# Patient Record
Sex: Female | Born: 1975 | Hispanic: Yes | Marital: Married | Smoking: Never smoker
Health system: Southern US, Community
[De-identification: ages and names within clinical notes are randomized; demographics above are authoritative.]

## PROBLEM LIST (undated history)

## (undated) ENCOUNTER — Emergency Department (HOSPITAL_COMMUNITY): Payer: BC Managed Care – PPO | Source: Home / Self Care

## (undated) DIAGNOSIS — F32A Depression, unspecified: Secondary | ICD-10-CM

## (undated) DIAGNOSIS — I2510 Atherosclerotic heart disease of native coronary artery without angina pectoris: Secondary | ICD-10-CM

## (undated) DIAGNOSIS — M67439 Ganglion, unspecified wrist: Secondary | ICD-10-CM

## (undated) DIAGNOSIS — Z5189 Encounter for other specified aftercare: Secondary | ICD-10-CM

## (undated) DIAGNOSIS — D649 Anemia, unspecified: Secondary | ICD-10-CM

## (undated) DIAGNOSIS — F419 Anxiety disorder, unspecified: Secondary | ICD-10-CM

## (undated) DIAGNOSIS — G43909 Migraine, unspecified, not intractable, without status migrainosus: Secondary | ICD-10-CM

## (undated) DIAGNOSIS — I10 Essential (primary) hypertension: Secondary | ICD-10-CM

## (undated) DIAGNOSIS — C801 Malignant (primary) neoplasm, unspecified: Secondary | ICD-10-CM

## (undated) DIAGNOSIS — G5601 Carpal tunnel syndrome, right upper limb: Secondary | ICD-10-CM

## (undated) DIAGNOSIS — M7711 Lateral epicondylitis, right elbow: Secondary | ICD-10-CM

## (undated) DIAGNOSIS — M1990 Unspecified osteoarthritis, unspecified site: Secondary | ICD-10-CM

## (undated) DIAGNOSIS — F329 Major depressive disorder, single episode, unspecified: Secondary | ICD-10-CM

## (undated) HISTORY — DX: Anemia, unspecified: D64.9

## (undated) HISTORY — PX: CARDIAC CATHETERIZATION: SHX172

## (undated) HISTORY — DX: Encounter for other specified aftercare: Z51.89

## (undated) HISTORY — DX: Unspecified osteoarthritis, unspecified site: M19.90

## (undated) HISTORY — PX: ABDOMINAL HYSTERECTOMY: SHX81

---

## 2004-02-04 ENCOUNTER — Ambulatory Visit (HOSPITAL_COMMUNITY): Admission: RE | Admit: 2004-02-04 | Discharge: 2004-02-04 | Payer: Self-pay | Admitting: Endocrinology

## 2004-02-11 ENCOUNTER — Encounter (INDEPENDENT_AMBULATORY_CARE_PROVIDER_SITE_OTHER): Payer: Self-pay | Admitting: Specialist

## 2004-02-11 ENCOUNTER — Observation Stay (HOSPITAL_COMMUNITY): Admission: RE | Admit: 2004-02-11 | Discharge: 2004-02-12 | Payer: Self-pay | Admitting: General Surgery

## 2004-02-11 HISTORY — PX: CHOLECYSTECTOMY: SHX55

## 2004-06-24 ENCOUNTER — Other Ambulatory Visit: Admission: RE | Admit: 2004-06-24 | Discharge: 2004-06-24 | Payer: Self-pay | Admitting: Obstetrics and Gynecology

## 2004-06-27 ENCOUNTER — Other Ambulatory Visit: Admission: RE | Admit: 2004-06-27 | Discharge: 2004-06-27 | Payer: Self-pay | Admitting: Obstetrics and Gynecology

## 2004-08-16 ENCOUNTER — Ambulatory Visit: Payer: Self-pay | Admitting: Internal Medicine

## 2004-10-28 ENCOUNTER — Ambulatory Visit (HOSPITAL_COMMUNITY): Admission: RE | Admit: 2004-10-28 | Discharge: 2004-10-28 | Payer: Self-pay | Admitting: Obstetrics and Gynecology

## 2004-11-24 ENCOUNTER — Other Ambulatory Visit: Admission: RE | Admit: 2004-11-24 | Discharge: 2004-11-24 | Payer: Self-pay | Admitting: Obstetrics and Gynecology

## 2004-12-23 ENCOUNTER — Ambulatory Visit (HOSPITAL_COMMUNITY): Admission: RE | Admit: 2004-12-23 | Discharge: 2004-12-23 | Payer: Self-pay | Admitting: Obstetrics and Gynecology

## 2005-02-03 ENCOUNTER — Ambulatory Visit (HOSPITAL_COMMUNITY): Admission: RE | Admit: 2005-02-03 | Discharge: 2005-02-03 | Payer: Self-pay | Admitting: Obstetrics and Gynecology

## 2005-03-10 ENCOUNTER — Inpatient Hospital Stay (HOSPITAL_COMMUNITY): Admission: AD | Admit: 2005-03-10 | Discharge: 2005-03-15 | Payer: Self-pay | Admitting: Obstetrics and Gynecology

## 2005-03-31 ENCOUNTER — Ambulatory Visit: Payer: Self-pay | Admitting: Endocrinology

## 2005-04-14 ENCOUNTER — Other Ambulatory Visit: Admission: RE | Admit: 2005-04-14 | Discharge: 2005-04-14 | Payer: Self-pay | Admitting: Obstetrics and Gynecology

## 2005-04-14 ENCOUNTER — Ambulatory Visit: Payer: Self-pay | Admitting: Endocrinology

## 2005-11-08 ENCOUNTER — Emergency Department (HOSPITAL_COMMUNITY): Admission: EM | Admit: 2005-11-08 | Discharge: 2005-11-08 | Payer: Self-pay | Admitting: Family Medicine

## 2005-11-16 ENCOUNTER — Emergency Department (HOSPITAL_COMMUNITY): Admission: EM | Admit: 2005-11-16 | Discharge: 2005-11-17 | Payer: Self-pay | Admitting: Emergency Medicine

## 2007-11-13 ENCOUNTER — Inpatient Hospital Stay (HOSPITAL_COMMUNITY): Admission: AD | Admit: 2007-11-13 | Discharge: 2007-11-13 | Payer: Self-pay | Admitting: Obstetrics and Gynecology

## 2007-11-18 ENCOUNTER — Inpatient Hospital Stay (HOSPITAL_COMMUNITY): Admission: AD | Admit: 2007-11-18 | Discharge: 2007-11-18 | Payer: Self-pay | Admitting: Obstetrics & Gynecology

## 2007-12-05 DIAGNOSIS — M1990 Unspecified osteoarthritis, unspecified site: Secondary | ICD-10-CM | POA: Insufficient documentation

## 2007-12-05 DIAGNOSIS — I10 Essential (primary) hypertension: Secondary | ICD-10-CM | POA: Insufficient documentation

## 2007-12-05 DIAGNOSIS — E785 Hyperlipidemia, unspecified: Secondary | ICD-10-CM | POA: Insufficient documentation

## 2007-12-05 DIAGNOSIS — I4891 Unspecified atrial fibrillation: Secondary | ICD-10-CM | POA: Insufficient documentation

## 2007-12-05 DIAGNOSIS — I498 Other specified cardiac arrhythmias: Secondary | ICD-10-CM | POA: Insufficient documentation

## 2007-12-11 ENCOUNTER — Emergency Department (HOSPITAL_COMMUNITY): Admission: EM | Admit: 2007-12-11 | Discharge: 2007-12-12 | Payer: Self-pay | Admitting: Emergency Medicine

## 2007-12-14 ENCOUNTER — Inpatient Hospital Stay (HOSPITAL_COMMUNITY): Admission: EM | Admit: 2007-12-14 | Discharge: 2007-12-20 | Payer: Self-pay | Admitting: Emergency Medicine

## 2007-12-19 ENCOUNTER — Other Ambulatory Visit: Payer: Self-pay | Admitting: Obstetrics

## 2007-12-20 ENCOUNTER — Other Ambulatory Visit: Payer: Self-pay | Admitting: Obstetrics

## 2007-12-20 ENCOUNTER — Encounter: Payer: Self-pay | Admitting: Obstetrics

## 2007-12-26 ENCOUNTER — Inpatient Hospital Stay (HOSPITAL_COMMUNITY): Admission: AD | Admit: 2007-12-26 | Discharge: 2007-12-27 | Payer: Self-pay | Admitting: Obstetrics and Gynecology

## 2008-03-05 ENCOUNTER — Inpatient Hospital Stay (HOSPITAL_COMMUNITY): Admission: AD | Admit: 2008-03-05 | Discharge: 2008-03-05 | Payer: Self-pay | Admitting: Obstetrics and Gynecology

## 2008-03-16 ENCOUNTER — Ambulatory Visit: Payer: Self-pay | Admitting: Pulmonary Disease

## 2008-03-16 ENCOUNTER — Inpatient Hospital Stay (HOSPITAL_COMMUNITY): Admission: AD | Admit: 2008-03-16 | Discharge: 2008-04-15 | Payer: Self-pay | Admitting: Obstetrics and Gynecology

## 2008-03-17 ENCOUNTER — Encounter: Payer: Self-pay | Admitting: Obstetrics and Gynecology

## 2008-03-24 ENCOUNTER — Encounter: Payer: Self-pay | Admitting: Obstetrics and Gynecology

## 2008-04-01 ENCOUNTER — Encounter: Payer: Self-pay | Admitting: Obstetrics and Gynecology

## 2008-04-04 ENCOUNTER — Encounter (INDEPENDENT_AMBULATORY_CARE_PROVIDER_SITE_OTHER): Payer: Self-pay | Admitting: Obstetrics and Gynecology

## 2008-04-05 HISTORY — PX: TUBAL LIGATION: SHX77

## 2008-04-09 ENCOUNTER — Encounter (INDEPENDENT_AMBULATORY_CARE_PROVIDER_SITE_OTHER): Payer: Self-pay | Admitting: Obstetrics and Gynecology

## 2008-09-18 ENCOUNTER — Emergency Department (HOSPITAL_COMMUNITY): Admission: EM | Admit: 2008-09-18 | Discharge: 2008-09-19 | Payer: Self-pay | Admitting: Emergency Medicine

## 2008-09-25 ENCOUNTER — Emergency Department (HOSPITAL_COMMUNITY): Admission: EM | Admit: 2008-09-25 | Discharge: 2008-09-26 | Payer: Self-pay | Admitting: Emergency Medicine

## 2009-08-22 ENCOUNTER — Emergency Department (HOSPITAL_COMMUNITY): Admission: EM | Admit: 2009-08-22 | Discharge: 2009-08-22 | Payer: Self-pay | Admitting: Emergency Medicine

## 2010-04-09 ENCOUNTER — Emergency Department (HOSPITAL_COMMUNITY): Admission: EM | Admit: 2010-04-09 | Discharge: 2010-04-09 | Payer: Self-pay | Admitting: Emergency Medicine

## 2010-08-14 ENCOUNTER — Encounter: Payer: Self-pay | Admitting: Endocrinology

## 2010-09-23 ENCOUNTER — Ambulatory Visit (INDEPENDENT_AMBULATORY_CARE_PROVIDER_SITE_OTHER): Payer: No Typology Code available for payment source

## 2010-09-23 ENCOUNTER — Inpatient Hospital Stay (INDEPENDENT_AMBULATORY_CARE_PROVIDER_SITE_OTHER)
Admission: RE | Admit: 2010-09-23 | Discharge: 2010-09-23 | Disposition: A | Discharge status: Home / Self Care | Payer: No Typology Code available for payment source | Source: Ambulatory Visit | Attending: Emergency Medicine | Admitting: Emergency Medicine

## 2010-09-23 DIAGNOSIS — S335XXA Sprain of ligaments of lumbar spine, initial encounter: Secondary | ICD-10-CM

## 2010-09-23 DIAGNOSIS — S139XXA Sprain of joints and ligaments of unspecified parts of neck, initial encounter: Secondary | ICD-10-CM

## 2010-11-08 LAB — BASIC METABOLIC PANEL
BUN: 13 mg/dL (ref 6–23)
CO2: 25 mEq/L (ref 19–32)
Calcium: 9.3 mg/dL (ref 8.4–10.5)
Creatinine, Ser: 0.59 mg/dL (ref 0.4–1.2)
GFR calc Af Amer: >60 mL/min (ref >60)
Glucose, Bld: 100 mg/dL — ABNORMAL HIGH (ref 70–99)

## 2010-11-08 LAB — DIFFERENTIAL
Basophils Absolute: 0.1 10*3/uL (ref 0.0–0.1)
Basophils Relative: 1 % (ref 0–1)
Neutro Abs: 6.8 10*3/uL (ref 1.7–7.7)
Neutrophils Relative %: 57 % (ref 43–77)

## 2010-11-08 LAB — CBC
MCHC: 33 g/dL (ref 30.0–36.0)
Platelets: 266 10*3/uL (ref 150–400)
RDW: 13.3 % (ref 11.5–15.5)

## 2010-11-08 LAB — URINALYSIS, ROUTINE W REFLEX MICROSCOPIC
Bilirubin Urine: NEGATIVE
Ketones, ur: NEGATIVE mg/dL
Nitrite: NEGATIVE
Specific Gravity, Urine: 1.021 (ref 1.005–1.030)
Urobilinogen, UA: 0.2 mg/dL (ref 0.0–1.0)

## 2010-11-14 ENCOUNTER — Emergency Department (HOSPITAL_COMMUNITY)
Admission: EM | Admit: 2010-11-14 | Discharge: 2010-11-14 | Disposition: A | Discharge status: Home / Self Care | Payer: Self-pay | Attending: Emergency Medicine | Admitting: Emergency Medicine

## 2010-11-14 DIAGNOSIS — R0789 Other chest pain: Secondary | ICD-10-CM | POA: Insufficient documentation

## 2010-12-06 NOTE — Consult Note (Signed)
[name]  NIQUITA, [name] NO.:  [id number]   MEDICAL RECORD NO.:  [id number]          PATIENT TYPE:  EMS   LOCATION:  ED                           FACILITY:  [facility]   PHYSICIAN:  Deanna Artis. Hickling, M.D.DATE OF BIRTH:  [birth year]   DATE OF CONSULTATION:  12/12/2007  DATE OF DISCHARGE:                                 CONSULTATION   CHIEF COMPLAINT:  Headaches   HISTORY OF PRESENT CONDITION:  [name] is a 35 year old (today),  right-handed Hispanic woman, gravida 3, para 0-2-0-2 woman who is now [redacted]  weeks gestation.  The patient has had headache of two weeks' duration  that was relieved with Tylenol but has been persistent.  She has had a  continuous headache for two days and presented to the [facility]  Emergency Room after she awakened from a nap at 5:00 p.m. Dec 11, 2007.  The left eye was swollen, tender and had blurred vision.  She was sent  by Dr. Tamela Oddi to the [facility] Emergency Department for  evaluation.  She arrived at 10:55 p.m. and was observed all night long.  She was given a variety of medications including acetaminophen, Zofran,  promethazine, morphine, metoclopramide and Dilaudid.  The headache  finally dropped from 8 or 9 on a scale of 10 to 3.  I was asked to see  her to determine the etiology of her dysfunction and make  recommendations for further workup and treatment.   The patient has hypertension that predated this pregnancy and has been  under the care of Dr. Renaye Rakers.   CURRENT MEDICATIONS:  1. Prenatal vitamins.  2. Reglan 10 mg three times a day.  3. Aldomet 200 mg three times a day.   DRUG ALLERGIES:  None known.   PAST MEDICAL HISTORY:  Remarkable for  1. Hypertension.  2. She has had previous migraine headaches, although none as frequent      or severe.  They began at age 86.   PAST SURGICAL HISTORY:  1. Cesarean section.  2. Laparoscopic cholecystectomy.   REVIEW OF SYSTEMS:  Remarkable for sensory to  light and tenderness in  the left eye which is also somewhat itchy.  She complains of a headache  that is throbbing and dull that involves the left occipital region  extending around to the temporoparietal region.  She has not had fever,  rhinorrhea.  She has had nausea without vomiting.  She has not had  evidence of rash, swelling of her joints, dyspnea, persistent cough,  chest pain, palpitations, no diabetes or thyroid disease.  No  environmental allergies.  No mood or other behavior disorders.  No  diplopia, dysarthria, dysphagia, tinnitus, syncope, vertigo, weakness,  numbness, tingling or loss of bowel or  bladder control.  No seizures.  The 12-system review is otherwise negative.   PAST GYNECOLOGIC HISTORY:  The patient has had preeclampsia of preterm  labor and delivery.   FAMILY HISTORY:  Negative for migraines, seizures, mental retardation,  blindness, deafness, birth defects, Alzheimer's disease, Parkinson's  disease or other neurologic disorders.   SOCIAL HISTORY:  The patient  does not use tobacco, alcohol or drugs.  Her husband is at bedside.  The two children are with grandparents.   PHYSICAL EXAMINATION:  VITAL SIGNS:  On examination today, blood  pressure 128/83 resting pulse 64, respirations 14, temperature is 98.3.  HEENT:  No signs of infection except in the left eye.  I think that she  has conjunctivitis.  Left eye tender to touch but is not showing  increased pressure in the orbit when I push on the eye and compare it  with the right.  Hence, I do not think this represents glaucoma.  The  conjunctivae as well as the sclerae are inflamed.  There is no  significant discharge.  She has no other sign of infection in the head  and neck.  NECK:  Supple neck, full range of motion.  No cranial or cervical  bruits.  LUNGS:  Clear to auscultation.  HEART:  No murmurs.  Pulses normal.  ABDOMEN:  Soft, nontender.  Bowel sounds normal.  EXTREMITIES:  Well-formed without  edema, cyanosis, alterations in tone  or tight heel cords.  SKIN:  No lesions.  VASCULAR:  Tone was normal.  NEUROLOGIC:  Mental status:  The patient is awake, alert, attentive,  appropriate, no dysphasia, dyspraxia, names objects, follows commands.  Conveys thoughts and feelings.  Concentration and fund of knowledge  appear normal.  Cranial nerves:  Round reactive pupils.  Visual fields  full.  Extraocular movements full and conjugate.  She has a little pain  in her left eye moving it side-to-side.  Visual acuity 20/25  bilaterally.  Symmetric facial strength.  Midline tongue and uvula.  Air  conduction greater than bone conduction bilaterally.   Motor examination:  Normal strength, tone and mass.  Good fine motor  movements.  No pronator drift.  Sensation intact to cold, vibration and  stereognosis.  Cerebellar examination:  Good finger-to-nose, rapid  repetitive movements.  Gait was not tested.  Deep tendon reflexes were  normal to brisk at least and lower extremities, normal in the upper  extremities.  She had bilateral flexor plantar responses.  No primitive  reflexes were seen.   IMPRESSION:  1. Migraine without aura, 346.10.  2. Hypertension, 404.10.  3. Conjunctivitis left eye.   Differential diagnosis of the red eye would be glaucoma, optic neuritis  and conjunctivitis.  The patient does not have changes in visual acuity  or visual field.  Fundi is normal.  I see no abnormalities in the lens  or iris to suggest iritis.   RECOMMENDATIONS:  The patient should go home.  She will need ongoing  pain medicine in all likelihood.  This will need to be prescribed by Dr.  Tamela Oddi.  Propranolol is a possibility for the patient.  I would  recommend a dose of 10 mg twice daily, slowly increasing to see if we  can bring headaches under control.  There is no class B preventative  medication.  She is already on Reglan as recommended by Dr. Christell Constant.  She  may need further control of  her blood pressure which remains elevated at  this time.  Propranolol in concert with Aldomet would be very helpful.  Again, this will need to be decided by Dr. Christell Constant.  We have ruled out  serious underlying issues such as subarachnoid bleed and meningitis  because she has no stiff neck and no change in sensorium and the  headache also has eased off considerably.  The quality the headache is  migrainous in nature.  The eye, though a confounding condition, I think  is infectious, not inflammatory or glaucoma.   I discussed the case with Dr. Tamela Oddi and the patient will be  discharged home after a sedimentation rate is obtained.  I have asked  the emergency room to contact me with the result.  She will follow-up  with Dr. Tamela Oddi in one week.   I have had an opportunity to review her MRI scan and MRA personally and  reported the results.  Urinalysis shows specific gravity 1.015, pH 7,  ketones 15 mg/dL, urine nitrate and leukocyte esterase were negative.   Sodium 136, potassium 3.4, chloride 103, BUN 8, creatinine 0.7, glucose  84, ionized calcium 1.19.  Hemoglobin 13.3, hematocrit 39.  Bicarbonate  was 22.   White blood cell count 10,500, hemoglobin 12.21, hematocrit 36.2, MCV  83.6, platelet count 231,0000, 57 polys, absolute granulocyte count  6000, 31 lymphs, 9 monos, 2 eosinophils.   I will be happy to see the patient in follow-up at the request of Dr.  Tamela Oddi.  If you have questions or I can be of assistance, do not  hesitate to contact me.      Deanna Artis. Sharene Skeans, M.D.  Electronically Signed     [facility]/MEDQ  D:  12/12/2007  T:  12/12/2007  Job:  [id number]   cc:   Roseanna Rainbow, M.D.  Fax: [phone]   Renaye Rakers, M.D.  Fax: [phone]

## 2010-12-06 NOTE — Consult Note (Signed)
NAME:  [name], CHRISTO NO.:  [id number]   MEDICAL RECORD NO.:  [id number]          PATIENT TYPE:  OUT   LOCATION:  MFM                           [facility]ACILITY:  [facility]   PHYSICIAN:  Felipa Evener, MD  DATE OF BIRTH:  [birth year]   DATE OF CONSULTATION:  04/08/2008  DATE OF DISCHARGE:                                 CONSULTATION   IDENTIFICATION:  The patient is a 35 year old Hispanic female with a  past medical history significant for systemic hypertension for which she  has been treated as an outpatient by a hypertensive specialist as per  patient who came to the hospital for pregnancy-induced hypertension as  well as a question of preeclampsia.  The patient has never had any  history of seizures.  While the patient was in the hospital, she was  continued on her labetalol, however, that was stopped after her delivery  on April 04, 2008 and the patient remains persistently hypertensive.  She was given labetalol while the patient was on the floor, but little  effect on the blood pressure.  The patient was transferred to the  intensive care unit for further management of hypertension.  She also  complains of headache which would be persistent with hypertensive  urgency.   PAST MEDICAL HISTORY:  1. Significant for systemic hypertension.  2. Preeclampsia.  3. Depression.  4. Migraines.  5. Obesity.   PAST SURGICAL HISTORY:  1. Cholecystectomy.  2. C. section x2 for 2 premature children.   HOME MEDICATIONS:  1. Labetalol 400 mg t.i.d.  2. Tylenol.  3. Stadol.   ALLERGIES:  NO KNOWN DRUG ALLERGIES.   FAMILY HISTORY:  Noncontributory.   SOCIAL HISTORY:  She is a nonsmoker, nondrinker, no history of drug use  per patient.   REVIEW OF SYSTEMS:  A 12-point review of systems was performed and  negative other than as mentioned in the HPI.   PHYSICAL EXAMINATION:  GENERAL:  This is a well-appearing female sitting  comfortably in the exam bed in no acute  distress.  VITAL SIGNS:  She is afebrile with a heart rate of 96, blood pressure  143/85, saturation 99% on room air, respiratory rate 16.  HEENT:  Normocephalic, atraumatic.  Pupils are equal, round, and  reactive to light.  Extraocular movements are intact.  Oral and nasal  mucosa within normal limits.  NECK:  There is no thyromegaly or lymphadenopathy.  No hepatojugular  reflux.  HEART:  Regular rate and rhythm, normal S2 with a loud S1 appreciated at  the left sternal border.  LUNGS:  Bibasilar crackles, resolved with deep cough.  ABDOMEN:  Obese, soft, nontender and nondistended.  Positive bowel  sounds.  EXTREMITIES:  No edema.  No rash appreciated.  NEUROLOGIC:  Grossly intact except for a headache.   LABORATORY DATA:  Laboratory studies were all reviewed and significant  for sodium of 133, potassium 3.1, chloride 101, bicarbonate 27, glucose  107, BUN 7, creatinine 0.44, calcium 7.1, magnesium 5.0, protein 5.4,  albumin 2.2, AST 29, ALT 32, LDH 146, white blood cell count 10.9,  hemoglobin 10.2, hematocrit  30.4, platelet 137.  There is a significant  amount of protein in her urine and is being managed by the primary  service.   ASSESSMENT/PLAN:  The patient is a 35 year old female with a past  medical history significant for systemic hypertension requiring multiple  medications at home, being evaluated by a hypertensive specialist as an  outpatient basis.  The patient takes high-dosed labetalol on a regular  basis.  However, was kept off her medications post-partum.  Pulmonary  critical care is called for systemic hypertension evaluation and  management.  At this point with the blood pressure in the 170s prior to  initiation of Cardene, the patient's hypertension is consistent with  hypertensive urgency.  The patient is started on a Cardene drip and we  will continue that.  We will start labetalol p.o. at home doses.  We  will start it at 300 mg p.o. t.i.d. given the fact  that the patient has  been delivered withholding parameters initiated.  We will order a 2-D  echo and renal ultrasound with Doppler for renal artery stenosis.  We  will check urine for metanephrines for evidence of pheochromocytoma.  Would continue the Cardene at this point.  We will follow up with the  patient on Friday for results of the labs.  If the patient remains  hypertensive with the labetalol dosing, would recommend Norvasc 10 mg  daily and would strongly urge her for consultation by a renal physician  for further evaluation of systemic hypertension as at the patient's age,  really there should be little-no reasons other than reversible disease  for her systemic hypertension.      Felipa Evener, MD  Electronically Signed     [facility]/MEDQ  D:  04/08/2008  T:  04/09/2008  Job:  [id number]

## 2010-12-06 NOTE — H&P (Signed)
NAME:  [name], [name]              ACCOUNT NO.:  [id number]   MEDICAL RECORD NO.:  [id number]          PATIENT TYPE:  OBV   LOCATION:  9303                          [facility]ACILITY:  [facility]   PHYSICIAN:  Hal Morales, M.D.DATE OF BIRTH:  04/28/1976   DATE OF ADMISSION:  12/25/2007  DATE OF DISCHARGE:                              HISTORY & PHYSICAL   Patient is a 35 year old, married, Hispanic female, gravida 3, para 0-2-  0-2, at 50 and 5/7 weeks per an [facility] of June 19, 2008, who presents  today at [facility] office for a new OB interview and transfer of  care, with subsequent elevated blood pressures and unrelenting headache.  At office, blood pressure in her left arm was 152/110 and right arm was  148/104.  Patient was sent to [facility]U for serial blood pressures, CBC with  differential, and CMET, and further evaluation for need of blood  pressure management.  She complains of a headache, which she rates 9/10,  points to frontal and parietal areas of her head.  She reports it is  constant with associated pain that radiates down her left arm.  She has  had some relief with prescribed pain medications, but reports that the  headache has not completely resolved in several weeks.  Complains of a  pinching pain  in her left upper chest, fairly frequent nausea.  Last  emesis she recalls on Monday, today being Wednesday.  She does avoid  eating to prevent vomiting.  She complains of insomnia secondary to her  pain.  Benadryl has been tried in the past, which she states did not  help.  Complains of an unresolved cough and some wheezing with onset at  the end of April.  She was seen April 22, diagnosed with bronchitis with  a negative chest x-ray.  She returned April 27 with worsening signs and  symptoms.  Again, had a negative chest x-ray and was diagnosed with  asthma , treated with nebulizer.  She was also discharged with a metered  dose inhaler, but states, it does not help.  She denies  UTI signs or  symptoms, abdominal pain or cramping, visual disturbances, epigastric or  right upper quadrant pain, constipation or diarrhea, abnormal discharge  or vaginal bleeding, fever.  Patient began her prenatal care with  [facility].  She subsequently transferred to _[facility] secondary to  insurance change, and now is transferring to [place] as of  today, June 3.   She presented with complaint of a headache on Dec 11, 2007 to Maternity  Admissions Unit, and was subsequently transferred by Dr. Clearance Coots to  [facility] for further evaluation of headache.  She was discharged on  May 21.  She then represented and was readmitted on Dec 13, 2007, which  she stayed at [facility] through May 25.  She had extensive workup with  neuro consult, MRI, MRA.  She had negative Lyme disease and Epstein-Barr  virus titers.  No significant findings were found.  She was then  transferred back to [facility] on the 25th, where she  stayed through  May 29.  She was diagnosed during that timeframe with  left eye conjunctivitis.  She reports having several ultrasounds during  her pregnancy thus far.  She did have an ultrasound at the [facility] at [facility] last Friday, May 29.  Report  notes SIUP, cephalic presentation,anterior placenta above cervical os.  Fetal heart rate 145.  AFI was subjectively within normal limits.  Estimated fetal weight was 3 ounces, which was 97 grams.  Size was  consistent with dates.  Anatomy that was visualized appeared normal.  Cervix appeared closed without funneling, and was recommended to have  followup ultrasound three to four weeks for a full anatomy evaluation.   ALLERGIES:  No known drug allergies.   MEDICATIONS:  She is on:  1. Aldomet 500 mg p.o. b.i.d.  2. Reglan 10 mg p.o. t.i.d.  3. Fioricet p.r.n.   PAST MEDICAL HISTORY:  1. Chronic hypertension, which was diagnosed after her first delivery      after  it was unresolved.  2. Migraines diagnosed at age 13 without aura.  3. Depression.  Unsure when diagnosed, but she has not been on any      medication since December of 2008.   SURGICAL HISTORY:  She has had C-section x2 and a laparoscopic  cholecystectomy.   OBSTETRICAL HISTORY:  1. Gravida 1.  She was induced at 35 weeks secondary to preeclampsia,      and had subsequent failed induction and failure to progress leading      to a primary low transverse Cesarean section.  That was in      [place].  2. Gravida 2 was a repeat C-section at approximately 29 weeks for      preeclampsia also.  She reports the child has asthma, but is      otherwise healthy.  3. Gravida 3 is current pregnancy.   SOCIAL HISTORY:  She is married, two children.  Denies ETOH, tobacco or  illicit drug use.   FAMILY HISTORY:  Noncontributory.   OBJECTIVE:  Vital signs are within normal limits and she is afebrile  with exception of elevated blood pressures.  Range is 139 to 171  systolic, 78 to 108 diastolic.  On three different attempts, unable to  obtain fetal heart tones.  CBC was within normal limits.  Her white  count was 9, hemoglobin 11.6, hematocrit 33.8, platelets 240.  Her CMET  was within normal limits with exception of potassium equal to 3.  AST  was elevated equal to 39.  ALT was 50.  UA was within normal limits with  exception of cloudy appearance and specific gravity was 1.020.   PHYSICAL EXAMINATION:  GENERAL:  No acute distress.  Alert and oriented  x3.  Affect is slightly flat, but does smile at times.  She is soft-  spoken, well dressed, well nourished.  HEENT:  Grossly intact.  Normocephalic and atraumatic.  She does have  about a 3 cm area of redness, which is inferior to her left lower eyelid  and along her cheek bone.  She has no lymphadenopathy.  CARDIOVASCULAR:  Regular rate and rhythm.  No murmur.  LUNGS:  She has inspiratory wheezes and some crackles in her upper  airways and  lower bases.  ABDOMEN:  Soft.  No rebound or guarding, but tender in her right lower  quadrant when nurses attempted to obtain fetal heart tones.  She has  notable pannus and previous scar from two other C-sections.  PELVIC:  Deferred.  EXTREMITIES:  No edema.  DTRs 2+.  No clonus.  She had 5/5 grasp in  bilateral upper extremities.   ASSESSMENT:  1. Intrauterine pregnancy at 14-5/7 weeks.  2. Uncontrolled hypertension, which is chronic.  3. Migraines.  4. History of depression.  5. Hypokalemia.  6. Elevated liver function tests.  7. Asthma versus unresolved bronchitis.   PLAN:  1. Per Dr. Pennie Rushing, ad mitt for 23 hour observation for pain      management as well as blood pressure observation and medication      change.  2. Patient to receive and p.o. potassium replacement.  3. We will have maternal fetal medicine and psychiatry consults.  4. Albuterol inhaler q.i.d. for cough and wheezes.  5. We will obtain a 24 hour urine for a baseline kidney function.  6. Medical doctors to follow.      Candice Navarre, [place]      Hal Morales, M.D.  Electronically Signed    [facility]/MEDQ  D:  12/25/2007  T:  12/25/2007  Job:  [id number]

## 2010-12-06 NOTE — Op Note (Signed)
[name]  [name], [name] NO.:  [id number]   MEDICAL RECORD NO.:  [id number]         PATIENT TYPE:  WINP   LOCATION:                                [facility]  [facility]   PHYSICIAN:  Crist Fat. Rivard, M.D. DATE OF BIRTH:  1976-01-12   DATE OF PROCEDURE:  04/05/2008  DATE OF DISCHARGE:                               OPERATIVE REPORT   PREOPERATIVE DIAGNOSES:  Intrauterine pregnancy at 29 weeks and 1 day  with pregnancy-induced hypertension and superimposed severe  preeclampsia, two previous cesarean sections, and desire for  sterilization.   POSTOPERATIVE DIAGNOSES:  Intrauterine pregnancy at 29 weeks and 1 day  with pregnancy-induced hypertension and superimposed severe  preeclampsia, two previous cesarean sections, and desire for  sterilization.   ANESTHESIA:  Spinal, Dr. Pamalee Leyden.   PROCEDURE:  Repeat low-transverse cesarean section with bilateral tubal  ligation.   SURGEON:  Crist Fat. Rivard, MD   ASSISTANT:  Rhona Leavens, CNM.   ESTIMATED BLOOD LOSS:  500 mL.   PROCEDURE IN DETAIL:  After being informed of the planned procedure with  possible complications including bleeding; infection; injury to bowel,  bladder, or ureters; irreversibility of tubal ligation; as well as  failure rate of 1 in 500; informed consent was obtained.  The patient  was taken to OR #1, given spinal anesthesia without any complication.  She was placed in the dorsal decubitus position, pelvis tilted to the  left, prepped and draped in a sterile fashion, and a Foley catheter was  inserted in her bladder.  After assessing adequate level of anesthesia,  we infiltrated the previous scar with 20 mL of Marcaine 0.25 and  performed a Pfannenstiel incision overlooking the scar, which was  brought down sharply to the fascia.  The fascia was incised in a low-  transverse fashion and linea alba was dissected.  Peritoneum was entered  bluntly and at this time, we encountered dense adhesions with  omentum.  Those are sharply lysed and cauterized until we can push them up in the  upper abdomen to free the access to the lower pelvis.  We then also  noted a significant adhesions between the fascia and the lower half of  the uterus and those were sharply lysed until we can have access to the  pelvis.  A Alexis retractor was inserted.  The visceral peritoneum was  identified and opened in a low-transverse fashion to allow [place] to develop  a bladder flap and retract the bladder.  The lower uterine segment is  somewhat developed and we were able to perform a low-transverse cesarean  section with knife entry which was extended with bandage scissors.  Amniotic fluid was clear.  We assist the birth of a female infant at 49,  16, three nuchal cords were reduced.  Mouth and nose were suctioned.  Baby was delivered.  Cord was clamped with 2 Kelly clamps and baby was  given to Dr. Joana Reamer, neonatologist present in the room.  To be noted,  the infant was very active with good muscle tone and spontaneous  breathing and crying.  A 2 mL  of blood was drawn from the umbilical  artery and 10 mL was drawn from the umbilical vein and specimen was sent  for cord pH rapidly.  The placenta was allowed to deliver spontaneously.  It was complete cord, has 3 vessels and the uterine revision was  negative.  We proceed with closure of the myometrium in two layers,  first with a running-lock suture of 0 Vicryl then with a Lembert suture  of 0 Vicryl imbricating the first one.  We complete hemostasis on the  peritoneal edges using cauterization.  Both paracolic gutters were  cleaned.  Both tubes and ovaries assessed and normal, and the pelvis was  irrigated profusely with warm saline and we completed hemostasis with  cauterization.  We then proceed with tubal ligation, first with the left  tube and we proceeded in the exact same fashion on the right tube by  identifying the tube, grasping it with 2 Babcock forceps and  entering  the mesosalpinx with cautery.  We doubly ligated the proximal end and  the distal end of our chosen section, removed 1.5 cm of the isthmal-  ampullary tube and cauterized both stumps.  Hemostasis was adequate.  We  again irrigated profusely the pelvis to note a satisfactory hemostasis.  The muscles were reapproximated with simple sutures of 0 Vicryl and  under fascia hemostasis was completed with cautery.  The fascia was  closed with 2 running sutures of 1 Vicryl meeting midline.  The wound  was irrigated with warm saline.  Hemostasis is adequate, is completed  with cautery, and a #10 JP drain was left in the incision with a left  counter incision and held in place with 0 silk.  The skin was closed  with subcuticular suture of 3-0 Monocryl and Steri-Strips.   Instrument and sponge count is complete x2.  Estimated blood loss is 500  mL.  The procedure was very well tolerated by the patient who was taken  to recovery room in a well and stable condition.   SPECIMEN:  Placenta and 2 segments of tubes sent to Pathology.   Little boy born at 11:22 p.m., received an Apgar of 9 at 1 minute and 9  at 5 minutes, weight 2 pounds 6 ounces, and cord pH was 7.26.  Baby is  in NICU.      Crist Fat Rivard, M.D.  Electronically Signed     SAR/MEDQ  D:  04/05/2008  T:  04/05/2008  Job:  [id number]

## 2010-12-06 NOTE — H&P (Signed)
NAMEWALTERINE, [name]              ACCOUNT NO.:  [id number]   MEDICAL RECORD NO.:  [id number]          PATIENT TYPE:  INP   LOCATION:  9158                          [facility]ACILITY:  [facility]   PHYSICIAN:  Hal Morales, M.D.DATE OF BIRTH:  1976/02/04   DATE OF ADMISSION:  03/16/2008  DATE OF DISCHARGE:                              HISTORY & PHYSICAL   HISTORY OF PRESENT ILLNESS:  This is a 35 year old gravida 3, para 0-2-0-  2 at 26-3/7 weeks who presents with elevation of blood pressures despite  medication.  She has been on labetalol 400 mg 3 times a day.  She states  she has missed no doses except for this morning dose.  She did report  vomiting after being a given dose from the hospital a week ago, but has  taken all of her doses since then.  She does report a headache for  several days and upper abdominal pain.  Pregnancy has been followed  since 14 weeks' by the MD Service and they remarkable for:  1. Transfer of care 14 weeks.  2. C-section x2 secondary to preeclampsia.  3. History of preeclampsia and chronic hypertension.  4. Desires BTL.  5. Depression.  6. Migraines.  7. Obesity.  8. Status post cholecystectomy.  9. Abnormal beta-glycoproteins.   ALLERGIES:  None.   OB HISTORY:  Remarkable for C-section delivery in 2002, female infant at  [redacted] weeks gestation, weighing 5 pounds remarkable for preeclampsia and  failure to progress.  She had a C-section delivery in 2006 of a female  infant at [redacted] weeks gestation, weighing 3 pounds 4 ounces remarkable for  preeclampsia.   PAST MEDICAL HISTORY:  Remarkable for postpartum depression,  preeclampsia, and preterm delivery.  She has a history of childhood  varicella.  She has a history of chronic hypertension since 2002.  She  has a history of anemia and migraines.  She has a history of depression,  but has stopped taking her medications.   PAST SURGICAL HISTORY:  Remarkable for C-section in 2002 and 2006, and  cholecystectomy in  2004.   FAMILY HISTORY:  Remarkable for an uncle with heart disease.  Mother and  brother with hypertension.  Sister and brother with anemia.  Father and  son with asthma.  Mother with breast cancer.   GENETIC HISTORY:  Remarkable for brother and cousin with consanguinity.  Cousin with mental retardation and a cousin with twins.   SOCIAL HISTORY:  The patient is married to Lajean Silvius who is involved  and supportive.  She is of the [employer].  She denies any alcohol,  tobacco, or drug use.   PRENATAL LABS:  Hemoglobin 12.2, platelets 274.  Blood type O+.  Antibody screen negative.  Sickle cell negative.  RPR nonreactive.  Rubella immune.  Hepatitis negative.  Pap test normal.  Gonorrhea  negative.  Chlamydia negative.   HISTORY OF CURRENT PREGNANCY:  The patient was previously seen at  [facility] and [place], and transferred to [facility] at 14 weeks.  She was hospitalized at that time for hypertension and placed initially  on Aldomet 500  mg twice a day and was also placed on Celexa and baby  aspirin for depression and abnormal glycoproteins, but has since stopped  taking those.  She was seen by Neurology for chronic headaches and  migraines.  She was increased to Aldomet 500 mg 3 times a day.  Her  baseline 24-hour urine protein was 80 mg.  She was given Tylenol #3 by  the neurologist for her headaches.  Ultrasound at 18 weeks was normal.  She had an ultrasound at 24 weeks showing a weight of 1 pound 9 ounces,  normal fluid, and vertex presentation with an anterior placenta.  Blood  pressure was elevated.  At that time, she was sent to the hospital for  [facility] workup.  Labs at that time were normal and urine was negative for  protein.  She was changed to labetalol initially 300 mg 3 times a day  and then later increased to 400 mg 3 times a day and she presented to  the office today with headaches and abdominal pain and was found to have  elevations in her blood pressure.    OBJECTIVE DATA:  VITAL SIGNS:  Stable, afebrile.  Blood pressures  initially 179-196/103-111.  Heart rate is in the 70s.  HEENT:  Within normal limits.  Thyroid normal and not enlarged.  CHEST:  Clear to auscultation.  HEART:  Regular rate and rhythm.  ABDOMEN:  Gravid.  EFM shows fetal heart rate in 150s with no  contractions.  NEURO:  Grossly intact with DTRs 2+, 3+ with no clonus.  EXTREMITIES:  Trace edema in the lower extremities with puffy hands.  PELVIC:  Deferred   ASSESSMENT:  1. Intrauterine pregnancy at 26-3/7 weeks.  2. Chronic hypertension.  3. Abnormal beta-glycoproteins.  4. History of depression.  5. History of preeclampsia x2 with cesarean section x2.   PLAN:  1. Admit to antenatal per Dr. Pennie Rushing.  2. IV labetalol for immediate blood pressure control and then we will      begin Aldomet 500 mg 3 times a day.  3. PIH labs and 24-hour urine.  4. Maternal fetal medicine consult Dr. Katrinka Blazing is presently here and      making recommendations.      [name], C.N.M.      ______________________________  Hal Morales, M.D.    MLW/MEDQ  D:  03/16/2008  T:  03/17/2008  Job:  [id number]

## 2010-12-06 NOTE — Consult Note (Signed)
[name]  Emily, [name]              ACCOUNT NO.:  [id number]   MEDICAL RECORD NO.:  [id number]          PATIENT TYPE:  OBV   LOCATION:  9303                          [facility]ACILITY:  [facility]   PHYSICIAN:  Emily Copier, MD           DATE OF BIRTH:  1975-08-06   DATE OF CONSULTATION:  12/26/2007  DATE OF DISCHARGE:                                 CONSULTATION   REASON FOR CONSULTATION:  The patient is 14 plus weeks pregnant with  chronic hypertension and persistent migraine headache.  The consult is  requested to address the following issues.  1. Blood pressure management.  2. Chronic headache.  3. History of preterm birth.  4. Abnormal beta-2-glycoprotein findings.   HISTORY OF PRESENT ILLNESS:  The patient is a 35 year old G3, P2,  currently at 31 plus weeks gestation previously seen by me on Dec 17, 2007, at the request of Dr. Coral [name], secondary to similar  complaints.  She complaints of headache that were present now for over 3  weeks and is status post consultations from neurology as well as  management at [facility].  This includes consultations with  neurology, internal medicine and hypertension medicine since Dec 12, 2007.  She is also undergone MRA and MRI in her previous admission.  She  has a history of migraine headache and hypertension, however, this  chronic pain is new, especially the duration.   PAST MEDICAL HISTORY:  As noted above.   MEDICATIONS:  1. Prenatal vitamins.  2. Reglan.  3. Aldomet.  4. Previously Compazine.   PAST SURGICAL HISTORY:  Notable for cesarean delivery x2.   PAST OB HISTORY:  Notable for her first pregnancy completing at 35 weeks  secondary to preeclampsia and her second pregnancy also being  complicated with preeclampsia at 29 weeks delivery in August 2006.  Both  of these pregnancies were delivered via cesarean.   PAST GYN HISTORY:  Negative for any abnormal Pap smears.   SOCIAL HISTORY:  Negative for alcohol, tobacco, and  drugs abuse.   FAMILY HISTORY:  Noncontributory.   ALLERGIES:  No known drug allergies.   PHYSICAL EXAMINATION:  VITAL SIGNS:  She was noted to have a blood  pressure in the office 169/110 in her office visit yesterday, but during  her inpatients stay her blood pressure is range from 132 to 150 over 86  to 96.  Her pulse has been 60 to 72, and her respiratory rate of 18 to  20.  GENERAL:  The patient is alert and oriented in no acute distress.  RESPIRATORY:  Clear to auscultation.  CARDIAC:  Regular rate and rhythm.  ABDOMEN:  An over weight patient with no palpable abdominal masses and  notable gravid uterus.  EXTREMITIES:  No evidence of cyanosis or edema.   ASSESSMENT AND PLAN:  A 35 year old G3, P 0-2-0-2 at 14 plus weeks  admitted for hypertension and persistent headache.  1. Hypertension.  Currently, she is on Aldomet 500 mg b.i.d., which      has been increased in the hospital to t.i.d.  This can be increased      up to q.i.d. and then my recommendations will be to add labetalol      as needed to achieve the goal of 130 to 140s over 70 to 80s.   1. Persistent intractable headache.  This is a concern given the      duration.  As mentioned, she has undergone consultations with      neurology as well as radiologic imaging with MRI/MRA.  Given the      findings of elevated beta-2-glycoprotein, IgG antibody identified      during her hypercoagulable evaluation, secondary to her history of      two preeclampsia diagnoses,  my concern is that there may be a      component of antiphospholipid syndrome present.  There are some      reports of antiphospholipid syndrome having only CNS manifestations      and although this often includes stroke, this can be only white      matter lesions.  I would request that neurology be informed of this      finding of beta-2-glycoprotein levels, IgG antibodies being      positive.  They can help guide [place] on whether this patient's       presentation warrents further evaluation with follow-up MRI or      reevaluation of the prior MRI looking for small white matter      lesions consistent with the antiphospholipid syndrome.   Given this findings and her pregnancy status and her preeclampsia  history, I recommend starting this patient on low dose aspirin therapy  as an initial approach.  Unless  further information is obtained, we may  or may not consider adding heparin therapy as well.   Additional considerations related to the her headache this pregnancy, is  that many migraines improve after the first trimester and this may  improve spontaneously over course of the coming weeks - given that she  is only [redacted] weeks pregnant at this time.  Again, given the complexity of  these findings and the prior consultation with neurology, I would  recommended that they be reconsulted.  I would be readily available to  address any medications that they would recommend for headache therapy.   1. History of prior preterm birth.  The patient reports prior      deliveries at 35 and 29 weeks, both for preeclampsia.  Given that      these were indicated preterm births, she is not a candidate for      weekly progesterone therapy.  I would follow her closely for      development of preeclampsia and a 24-hour urine collection is in      progress.   1. Elevated liver function studies and beta-2-glycoprotein IgG      antibodies.  As mentioned, the beta-2-glycoprotein findings in      addition to her pregnancy history provide reason for management of      this pregnancy as if she has antiphospholipid syndrome and my      recommendations would be that low dose aspirin therapy be initiated      in the second and third trimesters.  We can reevaluate with regards      to heparin and low-molecular-weight heparin therapy, if baby      aspirin therapy fails.  With regards to the elevated ALT levels = 39 and AST levels = 50, I  would recommend  reevaluating those in  1 week to see if they are rising,  remaining stable or improving.  But at this time, I would not recommend  any other testing or treatment for the elevated LFTs.   Antenatal care plan:  Given my concern for antiphospholipid syndrome in  this patient, she will need serial ultrasound assessments every 3 to 4  weeks throughout her late second and third trimesters to assess for  fetal growth and amniotic fluid volume.  She will need umbilical artery  Doppler assessment as well, if there is any evidence of fetal growth  restriction.  The fetal assessment will also include nonstress testing  beginning twice weekly at [redacted] weeks gestation.  Again, we would gladly  assist with any and all of these management plans as the pregnancy  progresses.   Thank you very much for allowing me to participate in the care of Ms.  Bradly [name].  If you have any additional questions please feel free to  contact me.      Emily Copier, MD  Electronically Signed     SJ/MEDQ  D:  12/26/2007  T:  12/27/2007  Job:  [id number]

## 2010-12-06 NOTE — Discharge Summary (Signed)
[name], [name]              ACCOUNT NO.:  [id number]   MEDICAL RECORD NO.:  [id number]          PATIENT TYPE:  INP   LOCATION:  9303                          [facility]ACILITY:  [facility]   PHYSICIAN:  Hal Morales, M.D.DATE OF BIRTH:  01/18/1976   DATE OF ADMISSION:  12/25/2007  DATE OF DISCHARGE:  12/27/2007                               DISCHARGE SUMMARY   ADMISSION DIAGNOSES:  1. Intrauterine pregnancy at 14 weeks.  2. Chronic hypertension.  3. Migraine headaches.  4. History of depression.  5. Hypokalemia.  6. Elevated liver function tests.  7. Asthma.  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   DISCHARGE DIAGNOSES:  1. Intrauterine pregnancy at 14 weeks.  2. Chronic hypertension.  3. Migraine headaches.  4. History of depression.  5. Hypokalemia.  6. Elevated liver function tests.  7. Asthma.  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  9. Elevated liver function tests, improved.  10.Headache, improved.  11.Abnormal beta II glycoprotein IgG.   HOSPITAL PROCEDURES:  1. IV fluids.  2. Analgesia.  3. A 24-hour urine.  4. Perinatal consult.  5. Psychiatry consult.  6. Neurology consult.   HOSPITAL COURSE:  The patient was admitted with persistent headache and  elevated blood pressures as well as nausea and vomiting.  She had  previously had a thorough workup from a neurological standpoint in May  2009 with a negative workup.  She was started on a 24-hour urine to  evaluate her renal function.  Aldomet was increased to improve her blood  pressure control.  Reglan, Excedrin, and Flexeril were administered for  migraines as well as Motrin being added later.  Maternal fetal medicine  consult was ordered.  On December 26, 2007, she was doing well stating her  headache was some better but she still had one in the frontal part of  her head.  Blood pressures ranged from 119-150 over 72-96.  She had no  edema.  Liver function tests were AST of 31, ALT of 44; originally AST  was 39 and  ALT was 50.  She was seen by Dr. Ander Slade and evaluated for  recommendations; and on December 27, 2007, she was doing better.  Headache  had improved with the addition of ibuprofen, and we increased her  Aldomet from twice a day to 3 times a day.  Liver function tests were  down close to the normal at AST of 29 and ALT of 43.  A 24-hour urine  showed creatinine clearance of 257 and a total protein of 80.  Blood  pressures were improved and ranged from 120-126 over 77-84.  She was  seen by Dr. Jeanie Sewer, and he recommended beginning the patient on  Celexa for her depression.  Nausea and vomiting were also improved, and  she was discharged home on Zofran.  Dr. Pennie Rushing spoke with Dr. Ander Slade about  coordinating neurology followup in the future for her abnormal  glycoproteins that were identified as well as her headache history.  She  was deemed to receive full benefit of her hospital stay and was  discharged home.   DISCHARGE MEDICATIONS:  1. Zofran 4-8 mg every 8 hours as needed with Colace.  2. Celexa 10 mg every day.  3. Reglan 10 mg every 6 hours as needed for headache.  4. Ibuprofen 600 mg every 6 hours as needed for headache.  5. Aldomet 500 mg every 8 hours.  6. Prenatal vitamin 1 p.o. daily.   DISCHARGE LABS:  A 24-hour urine:  Protein 80, total bilirubin 0.2,  alkaline phosphatase of 41, AST 29, ALT 43, and creatinine clearance  257.   DISCHARGE INSTRUCTIONS:  Medication administration and diet as tolerated  for nausea.   DISCHARGE FOLLOWUP:  Followup will occur on January 10, 2008, at [facility] and appointments to be announced by Dr. Ronnell Freshwater office for  Neurology followup.   CONDITION ON DISCHARGE:  Good.      Marie L. Williams, C.N.M.      Hal Morales, M.D.  Electronically Signed    MLW/MEDQ  D:  12/27/2007  T:  12/28/2007  Job:  [id number]

## 2010-12-06 NOTE — H&P (Signed)
[name]  NOELIE, RENFROW              ACCOUNT NO.:  [id number]   MEDICAL RECORD NO.:  [id number]          PATIENT TYPE:  INP   LOCATION:  1521                         FACILITY:  [facility]   PHYSICIAN:  Madaline Savage, MD        DATE OF BIRTH:  09/16/1975   DATE OF ADMISSION:  12/13/2007  DATE OF DISCHARGE:                              HISTORY & PHYSICAL   PRIMARY CARE PHYSICIAN:  Dr. Renaye Rakers.   PRIMARY OBSTETRICIAN:  Dr. Tamela Oddi.   CHIEF COMPLAINT:  Headache.   Ms. Esperanza is a 35 year old G3, P2 who is at [redacted] weeks gestation who  comes in with complaints of headaches for the last 2 weeks.  She does  say she has history of migraine headaches in the past and as per the  patient the headache is mostly located in the back of the head and down  her neck and into her shoulders and arms.  This pain is constant, it is  throbbing in nature and she states it goes up to 9-10 on a scale of 1-  10.  At this time she rates the pain 7/10.  She went to her obstetrician  who sent her to [facility] ED yesterday and in the ED yesterday the ER  doctors consulted neurologist, Dr. Sharene Skeans.  Dr. Sharene Skeans saw the  patient and complete workup was done including an MRI/MRA and ESR and at  that time Dr. Sharene Skeans deemed that this headache is likely a migraine  attack.  He started her on Compazine and was discharged home yesterday.  After going home she said there was no relief of the pain.  The pain was  still constant in nature and she was not able to sleep because the pain,  and so she came back to the ER again.   PAST MEDICAL HISTORY:  1. History of hypertension.  2. History of migraine headaches in the past.   PAST SURGICAL HISTORY:  Cesarean section x2.   ALLERGIES:  No known drug allergies.   CURRENT MEDICATIONS:  She is on  1. Prenatal vitamins.  2. Reglan 5 mg three times daily.  3. Aldomet 250 mg twice daily.  4. Compazine 5 mg twice daily.   SOCIAL HISTORY:  There is no history of  smoking, alcohol, drug abuse.   FAMILY HISTORY:  Negative for migraines, seizures in the family.   REVIEW OF SYSTEMS:  GENERAL:  She denies any recent weight loss or  weight gain.  No fever or chills.  HEENT: She does complain of  headaches, some blurred vision but no double vision.  No runny nose.  No  sore throat.  CARDIOVASCULAR SYSTEM:  Denies chest pain, palpitations.  RESPIRATORY SYSTEM:  No shortness of breath or cough.  GI:  No abdominal  pain, nausea, vomiting, diarrhea, constipation.   PHYSICAL EXAMINATION:  She is alert and oriented x3.  VITAL SIGNS:  Temperature 97.8,  pulse rate of 70, blood pressure  147/94, respiratory rate 19, oxygen saturation 100% on room air.  HEENT:  Head atraumatic, normocephalic.  Pupils bilaterally equal and  react to  light.  Mucous membranes are moist.  NECK:  Supple.  No JVD, no carotid bruit.  CARDIOVASCULAR SYSTEM:  S1 and S2 heard.  Regular rate and rhythm.  CHEST:  Clear to auscultation.  ABDOMEN:  Soft.  Bowel sounds heard.  EXTREMITIES:  No edema, cyanosis or clubbing.   LABORATORIES:  Show a white count 11.9, hemoglobin 14, platelets 217.  Sodium 136, potassium 3.2, chloride 106, bicarb 5, BUN 0.5, glucose of  87.  Urinalysis is negative.  ESR which is done on May 21 was 10.   IMPRESSION:  1. Headache, likely a migraine attack.  2. Thirteen weeks gestation.  3. History of hypertension.  4. Hypokalemia.   PLAN:  This is a 35 year old lady who is at [redacted] weeks gestation who comes  in with a headache.  She was evaluated by a neurologist yesterday and  was found that this is most likely a migraine headache.  I have  discussed her care with the obstetrician on call, Dr. Clearance Coots.  I raised  the possibility of transferring her to [facility] but Dr. Clearance Coots  recommended that she be admitted at [facility].  We will admit  her to the floor.  I will start her on Dilaudid for pain and I will also  start her on propranolol which  was recommended by Dr. Sharene Skeans.  I will  also continue the Compazine as before.  I will ask Dr. Clearance Coots, the GYN  doctor, to see her as a consult while she is in the hospital.  We will  try to control her pain with Dilaudid and Tylenol.  If her pain is not  under control then we can consider starting her on Imitrex but Imitrex  is class B in safety in pregnancy and so we should try to avoid it if we  can.  I will continue her other medications as before.  I will put her  on SCDs for DVT prophylaxis.      Madaline Savage, MD  Electronically Signed     PKN/MEDQ  D:  12/14/2007  T:  12/14/2007  Job:  [id number]

## 2010-12-06 NOTE — Consult Note (Signed)
[name]  [name], [name]              ACCOUNT NO.:  [id number]   MEDICAL RECORD NO.:  [id number]          PATIENT TYPE:  INP   LOCATION:                                FACILITY:  [facility]   PHYSICIAN:  Melvyn Novas, M.D.  DATE OF BIRTH:  June 14, 1976   DATE OF CONSULTATION:  03/23/2008  DATE OF DISCHARGE:                                 CONSULTATION   COMPLAINT:  Headache. This is the third neurologic consultation for this  pregnant female patient within 3 month.   HISTORY OF PRESENT ILLNESS:  This is a 32-year gravida 3, para 2 female,  now 26 weeks into her third pregnancy.  She presents to [facility]  with elevated blood pressure, which she has chronic elevated blood  pressures in the past few years.  In addition to her elevated blood  pressures, she has significant migraine headaches for the past  approximately 26 weeks.  The patient has been taking labetalol 400 mg  t.i.d. for her blood pressure at home, and she states that she has been  compliant with her medications.  In addition, to decrease her frontal cranial pain, the patient has been  taking Vicodin, Tylenol which have provided no relief.  She has recently started Stadol, which she states does take the edge  off.   The patient describes the headache as occipitally in the posterior  cervical region and in the frontal aspect around bilateral ocular  orbits.  The patient states there is no pain to the temporal regions to  palpation.  However, there is pain to palpation in the occipital region.  The patient stated that she has never had headaches in her previous  pregnancies, however starting this pregnancy, her migraine headaches  have been non stop.    No position seems to relieve her headaches,  unaware of any triggers  or relieves  for her headaches.  The patient does state that at times she will experience black spots/  scotoma and flashing lights in front of her eyes during the headache,  not preceding the  headache.  The patient has no difficulties with hearing, swallowing, mentation  while headache is apparent.    As stated, the patient states there is no correlation between when her  blood pressure is elevated as to when her headaches increase.   PAST SURGICAL HISTORY:  1. Cholecystectomy.  2. C-section x2.both children were premature.   PAST MEDICAL HISTORY:  Positive for:  1. Hypertension.  2. Preeclampsia.  3. Depression.  4. Migraines.  5. Obesity.   MEDICATIONS:  1. Labetalol 400 mg t.i.d.  2. Tylenol.  3. Stadol.   ALLERGIES:  None.   FAMILY HISTORY:  Mother has hypertension.   REVIEW OF SYSTEMS:  Complete 12- point review of systems was discussed  with the patient.  Pertinent positives include weight change, hypertension, headache,  preeclampsia,  obesity and depression.   PHYSICAL EXAM:  On physical exam, this is a pleasant 35 year old female  with blood pressure 162/91, pulse 74, respiratory rate 18, temperature  98.5.  At this time she is alert and oriented x3.  She  is able to carry out 2 and 3 step commands without any difficulty.  CRANIAL NERVES:  Bilateral pupils are equal, round and reactive to light  and accommodating.  Conjugate gaze,  External ocular muscles are intact.  Negative for ptosis.  Visual fields are grossly intact.  No nystagmus.  Face is symmetrical.  Tongue midline.  Uvula midline.  She does not have  any  dysarthria, aphasia, slurred speech or facial droop .  Sensation:  Primary modalities : intact to pinprick, light touch and  vibration .  Shoulder shrug, head tilt is within normal limits.  Coordination:  Finger-to-nose, heel-to-shin, fine motor movements are  within normal limits.  Gait: Deferred at this time.  Motor:  She has 5/5 strength in all 4 extremities.  She has good bulk  and tone.  No asterixis or clonus, and no drift.  Reflexes:  The patient  is 2+ deep tendon reflexes throughout with bilateral downgoing toes.  No  clonus.   Sensation is grossly intact to pinprick, vibration and light  touch.  She has a good ability to differentiate between right and left.  PULMONARY:  Clear to auscultation.  CARDIOVASCULAR:  S1, S2.  Regular rate and rhythm.  NECK:  Supple with no bruit.  However, she is tender to palpation in the  posterior cervical spine region.   LABORATORIES:  At this time AST is 19, ALT is 18.  Sodium 134, potassium  3.8, bicarb is 22, chloride 106, BUN 7, creatinine 0.46, glucose is 82.  White blood cell count 11.8, hemoglobin is 11.9, hematocrit 36.9,  platelets are 229.  GFR is greater than 16.   IMAGING AND TESTS:  None at this time.   ASSESSMENT:  [age]--year-old Hispanic female with chronic hypertension,  now pregnant with her third child, which is accompanied with severe  headaches.  These headaches are most likely secondary to hypertension and hormonal  changes associated with pregnancy. While there is a migrainous component  , there is a cervicalgia/ occipital component present as well. The  patient did deny headaches prior to pregnancy. As stated, the patient  has never had these headaches prior with her other pregnancies.  The patient has tried Tylenol and Vicodin.  However, has not received  any relief at this time.  Stadol does help with the headaches.   At this time, due to pregnancy , Stadol is the best choice of  management for her headache along with strict blood pressure control.  At this time we also recommend 500 mg magnesium oxide p.o. b.i.d. to  decrease blood pressure and help with extremety discomfort.  A steroid/lidocaine nerve block may be considered given the  history.  Please consider contacting a pain specialist for this intervention.   Please feel free to contact us at any time.  Thank you very much for the  consult.   Please note, that the patient has been seen in consult by Dr.Hickling  followed by Dr. Thad Ranger.     ______________________________  Felicie Morn, PA-C      Melvyn Novas, M.D.  Electronically Signed    DS/MEDQ  D:  03/23/2008  T:  03/23/2008  Job:  [id number]   cc:   Hal Morales, M.D.  Fax: [phone]

## 2010-12-06 NOTE — Discharge Summary (Signed)
[name], [name]              ACCOUNT NO.:  [id number]   MEDICAL RECORD NO.:  [id number]          PATIENT TYPE:  INP   LOCATION:                                FACILITY:  [facility]   PHYSICIAN:  Hal Morales, M.D.DATE OF BIRTH:  Dec 17, 1975   DATE OF ADMISSION:  03/16/2008  DATE OF DISCHARGE:  04/15/2008                               DISCHARGE SUMMARY   ADMITTING DIAGNOSES:  1. Intrauterine pregnancy at 26-3/7 weeks.  2. Chronic hypertension.  3. Abnormal beta glycoprotein  4. History of depression.  5. History of preeclampsia x2.  6. History of previous C-section x2.   DISCHARGE DIAGNOSES:  1. Intrauterine pregnancy at 29-1/7 weeks.  2. Severe preeclampsia.  3. Two previous cesarean sections with desire for repeat and desire      for sterilization.  4. Chronic hypertension.  5. Tension and migraine headaches  6. Hypokalemia, now corrected.   PROCEDURES:  1. Repeat low transverse cesarean section.  2. Bilateral tubal sterilization.  3. Spinal anesthesia.   HOSPITAL COURSE:  [name] is a 35 year old gravida 3, para 0-2-0-2  who presented for admission on March 16, 2008, at 26-3/7 weeks with  chronic hypertension with acute exacerbation.  1. Abnormal glycoproteins.  2. History of depression.  3. History of preeclampsia x2.  4. History of C-section previously x2.   On admission blood pressures were in the 103-111 diastolic range,  systolics were 179-196.  PIH labs had been normal on March 05, 2008.  She was having no contractions.  The patient was admitted to antenatal  and was given IV labetalol and was begun on Aldomet 500 mg p.o. t.i.d.  PIH labs and a 24-hour urine were also initiated and maternal fetal  medicine consult was obtained.  She had previously been on labetalol 400  mg p.o. t.i.d.   Maternal fetal medicine consult recommended the 24-hour urine as  previously noted, preeclampsia labs IV labetalol and hydralazine p.r.n.  Keep her blood pressure  between 140-150/80s; Aldomet 5 mg p.o. t.i.d.  was initiated in addition to current p.o. labetalol dose at 400 mg  t.i.d.  Gross scan, gross scan was recommended with maternal fetal  medicine and betamethasone was recommended.  There was also  recommendation for renal artery Doppler study when blood pressures were  stable in light of her history of chronic hypertension.  They also  recommended 24-hour urine for metanephrine to rule out pheochromocytoma  given her severe hypertension.  Baby aspirin was restarted for the  positive previous glycoprotein.  Betamethasone course was given on  March 16, 2008, and March 16, 2008.  The patient did have a history of  migraines diagnosed by Neurology.  This pregnancy, she was placed on  Percocet and Reglan with little benefit.  She required IV Apresoline,  was seen over the next several days.  On March 18, 2008,  she had a 24-  hour urine protein of 147 mg.  She also had a CT angiogram, which was  negative for any renal artery stenosis and no other renal component.  Growth on March 17, 2008, showed growth at  the 41st percentile, low  normal AFI and elevated Dopplers.  By March 19, 2008, the patient's  blood pressure was still having some spikes.  A consult with maternal  fetal medicine, decision was made to add Norvasc 5 mg and then  increasing it to 10 mg after 24 hours if no improvement.  PIH labs were  repeated on  March 19, 2008, were normal.  Over the next several days,  she continued to have elevated blood pressures.  Labetalol was added to  the regimen with a recommendation to add it until the maximum dose  achieved, continuing Aldomet and Norvasc; eventually was found that  Stadol IV was the only thing that covered her headaches.  Labetalol was  increased to 300 mg at midnight dose.  A 24-hour urine, PIH labs and ANA  were repeated on March 23, 2008.  Neurology consult was also obtained.  PIH labs were normal.  Ultrasound was done on  March 24, 2008.  Magnesium oxide was given 500 mg p.o. b.i.d. for headache.  By March 24, 2008, her 24-hour urine protein was 158.  She was on labetalol,  Aldomet Norvasc, and routinely with Apresoline given p.r.n.  Ultrasound  was performed that showed oligohydramnios, was resolved.  By March 26, 2008, her ANA showed weekly positive at 1-40.  At that time, her  current blood pressure regimen was Aldomet 750 mg p.o. q.6 h, labetalol  200 mg at 8:00 a.m. and 4:00 p.m.  Labetalol 300 mg at midnight, Norvasc  10 mg every 24 hours.  She also was placed on DVT prophylaxis with  Lovenox.  Her headache regimen included magnesium oxide 400 mg p.o.  b.i.d., Stadol 1 mg IV q.6 h and Phenergan 12.5 mg IV q.6 h.  Metanephrine were done March 23, 2008, and were normal.  Maternal fetal  medicine continued to follow the patient as requested.  She had some  sporadic decelerations.  Over the next several days headaches persisted.  Her Glucola was normal.  Blood pressures still had elevations despite  the regimen given.  She had another ultrasound on April 01, 2008,  showing estimated fetal weight at 2 pounds 1 ounce to 19th percentile.  Fluid was normal and a BPP was 8/8.  Dopplers were increased to 97.5%  with no reversal or absent flow.   Over the several days, she still required doses of IV Apresoline.  Labetalol was increased to 400 mg p.o. t.i.d.  Comprehensive metabolic  panel were normal and CBC were normal.  A 24-hour urine was repeated and  on April 04, 2008, it was resulted at 6202 mg of protein and a 24-  hour specimen.  Her blood pressures at that time were still in the 140s-  150s/92s to mid 100s, 105.  She did have some variable decelerations.  The decision was made to proceed at that point with repeat cesarean  section and tubal sterilization.   The patient was taken to the operating room on April 05, 2008, by  Dr. Dois Davenport Rivard where a viable female, weight 2  pounds 6 ounces and  Apgars of 9 and 9, was delivered by repeat cesarean section.  Tubal  sterilization was also performed.  Infant went to NICU.  The patient  went to recovery and then to ICU.  By postop day #1, she had a JP drain  that was draining a small amount, hemoglobin was 11.4, white blood cell  count 14.2, platelet count 154, SGOT was 28, SGPT  was 31.  Comprehensive  metabolic panel was otherwise normal.  She was continued on magnesium  sulfate for 24 hours after delivery.  Potassium was slightly low on  postop day #2.  This was corrected with oral potassium.  She continued  to struggle with headache over the next several days.  JP drain was  draining a small amount.  Her weight remained fairly stable, although  she had some slight elevation of 2-3 pounds.  Blood pressure was still  elevated by postop day #3.  She was previously on Aldomet 750 mg p.o.  q.i.d.  She was begun on Bystolic 20 mg p.o. daily on April 07, 2008, which was her pre pregnancy medication.  There was some concern as  whether she would take her Aldomet as previously described.  She was  also started on Zoloft 50 mg p.o. daily on April 07, 2008.  The  patient's blood pressure continued to be an issue over the next several  days and she was still struggling with headaches.  Infant was stable in  the NICU.  A consult was called with critical care eLink.  April 08, 2008, when her blood pressures remain 178/120, 178/112, despite  Apresoline.  Critical care did see the patient, they recommended a 2-D  echo and renal ultrasound with Dopplers.  By that time, she was placed  on a Cardene drip and on Norvasc and labetalol 300 mg p.o. t.i.d.  A 24-  hour urine and renal ultrasound were then cancelled by the critical  care.  Decision was made to consult with the patient's previous  physician Dr. Parke Simmers for hypertension management.  This was begun on  April 09, 2008, from that point on Dr. Parke Simmers was the  primary manager  of the patient's blood pressure and headache issues.  She was followed  obstetrically by [facility] and her continuing recovery from her C-  section.  By postop day #6, April 10, 2008, her SGOT was 31, SGPT  was 41, hemoglobin was 11.4.  Blood pressures were 150/96.  She was on K-  Dur.  By that time, she was on Vasotec 10 mg p.o. daily and labetalol  400 mg p.o. t.i.d.  At that time, her infant was also off oxygen in  NICU.  Echo was within normal limits.  She still had a bad headaches on  April 12, 2008, at that time she remained on Vasotec, labetalol, and  then was started on Procardia.  She seemed to improved after that point,  Neurology consult was obtained and their recommendation was to attribute  similar headaches or persistent migraine.  Their recommendation was to  discontinue the Stadol, keep the beta blocker and calcium channel  blocker daily, keep the magnesium oxide as preventative.  By April 14, 2008, the patient was at that time on Vasotec 10 mg b.i.d.,  labetalol 400 mg t.i.d., Procardia 30 mg p.o. b.i.d. and HCTZ 25 mg p.o.  daily.  She also was on Zoloft, Ativan, magnesium oxide, Percocet,  Ultram, Phenergan, intranasal steroid p.r.n. and was completing a  potassium course that was to be completed through April 16, 2008.  The patient by this time had demonstrated obstetrical stability, Dr.  Parke Simmers saw the patient on the afternoon of April 16, 2008, and felt  that she could better manage her blood pressure on an outpatient basis.  She felt that she was stable to be discharged and determined her  medication regimen at that time.  By  postop day #11, Dr. Pennie Rushing was  consulted as the on-call obstetrical physician that day.  Per Dr.  Pennie Rushing, the patient was appropriate to be discharged home in light of  Dr. Tedra Senegal recommendations from April 14, 2008.  The patient was  deemed to receive full benefit of her hospital stay.  Her  physical exam  was within normal limits.  Her blood pressures were in the 120s-130s/70s-  90s.  Her weight was 178.  Her incision was clean, dry, and intact.  She  did have a small amount of drainage yesterday from the incision, but no  erythema or malodor was noted.  Edges were well approximated.  She was  having no headache.  No visual symptoms or no epigastric pain.  She was  deemed to receive full benefit of her hospital stay and was discharged  home.   DISCHARGE INSTRUCTIONS:  The patient is to continue to monitor for signs  and symptoms of PIH or worsening hypertension.  Other discharge  instructions for care of her C-section were also reviewed with her by  her Airam Heidecker and a [facility] handout.   DISCHARGE MEDICATIONS:  1. Procardia XL 30 mg, 1 p.o. b.i.d.  2. Labetalol 400 mg, 1 p.o. t.i.d.  3. Vasotec 10 mg, 1 p.o. b.i.d.  4. HCTZ 25 mg, 1p.o. daily.  5. Zoloft 50 mg, 1 p.o. daily.  6. Ativan 1 mg p.o. t.i.d.  7. KCL 20 mEq, 1 p.o. daily.  8. Ultram 100 mg, 1 p.o. q.6h. p.r.n. headache.  9. Magnesium oxide 400 mg, 1 p.o. b.i.d.  10.Percocet 5/325 mg, 1-2 p.o. 3-4 hours p.r.n. pain, was also      prescribed by [facility] for pain management of her incision.   DISCHARGE FOLLOWUP:  Dr. Parke Simmers will see the patient on the following  Monday after discharge at 9:00 a.m. for blood pressure management.  She  also has requested home health to follow up with the patient regarding  her weight and blood pressure.  [facility] will follow up with  the patient in 4-6 weeks or p.r.n.      [name], C.N.M.      Hal Morales, M.D.  Electronically Signed    VLL/MEDQ  D:  04/15/2008  T:  04/15/2008  Job:  [id number]

## 2010-12-06 NOTE — Discharge Summary (Signed)
[name], [name]              ACCOUNT NO.:  [id number]   MEDICAL RECORD NO.:  [id number]          PATIENT TYPE:  INP   LOCATION:  9320                          FACILITY:  [facility]   PHYSICIAN:  Charles A. Clearance Coots, M.D.DATE OF BIRTH:  1976/05/20   DATE OF ADMISSION:  12/13/2007  DATE OF DISCHARGE:  12/20/2007                               DISCHARGE SUMMARY   ADMITTING DIAGNOSES:  1. A 13 weeks' gestation.  2. Severe headache.   DISCHARGE DIAGNOSES:  1. A 13 weeks' gestation.  2. Severe headache, resolved after supportive therapy.   Discharged home at 14 weeks' gestation, headaches resolved.   REASON FOR ADMISSION:  A 35 year old G3, P2 at 52 weeks' gestation  presents with complaint of headaches for 2 weeks.  The patient says she  has a history of migraine headaches in the past, but it is mostly  located in the back of the head and down her neck and into her shoulders  and arms with this headache.  This pain is constant and is throbbing in  nature.  She saw her obstetrician Dr. Antionette Char who sent her to  [facility] Emergency Room for evaluation of the headache.  Neurology  was consulted, Dr. Sharene Skeans, and a complete workup was done including an  MRI and erythrocyte sedimentation rate and at that time, Dr. Sharene Skeans  deemed that this headache was likely a migraine attack.  He started her  on Compazine and the patient was discharged home.  After going home, she  said there was no relief of the pain.  The pain was still constant in  nature and she was not able to sleep because of the pain, so she came  back to the emergency room at [facility] again for reevaluation.   PAST MEDICAL HISTORY:  Surgery; cesarean section x2, illnesses,  hypertension, which is being followed by Dr. Renaye Rakers in [place],  migraine headaches, and depression.   MEDICATIONS:  1. Prenatal vitamins.  2. Reglan.  3. Aldomet.  4. Compazine.   SOCIAL HISTORY:  Single.  Negative tobacco,  alcohol, or recreational  drug use.   REVIEW OF SYSTEMS:  GENERAL:  Denies any recent weight loss or weight  gain.  Denies fever or chills.  HEENT:  Headaches, blurred vision, but  no double vision.  CARDIOVASCULAR SYSTEM:  Denies chest pain and  palpitations.  RESPIRATORY SYSTEM:  Denies shortness of breath or cough.  GI:  Negative for nausea, vomiting, diarrhea, or constipation.   PHYSICAL EXAMINATION:  GENERAL:  She is alert and oriented x3.  VITAL SIGNS:  Temperature 97.8, pulse 70, blood pressure 147/94,  respiratory rate 19, and O2 saturation 100% on room air.  HEENT:  Head  atraumatic, normocephalic.  Pupils bilaterally equal and reactive to  light.  Mucous membranes are moist.  NECK:  Supple.  No JVD.  No carotid bruit.  CARDIOVASCULAR SYSTEM:  Regular rate and rhythm.  LUNGS:  Clear to auscultation bilaterally.  ABDOMEN:  Soft.  Bowel sounds were heard.  Fetal heart rate was 150-160  beats per minute with a Doppler.  EXTREMITIES:  No  edema, cyanosis, or clubbing.   ADMITTING LABS:  White blood cell count 11,900, platelets 217,000, and  hemoglobin 14.  Sodium 136, potassium 3.2, chloride 106, bicarb 5, BUN  0.5, and glucose 87.  Urinalysis was within normal limits.  Erythrocyte  sedimentation rate was 10.   IMPRESSION:  A [redacted] weeks gestation, headache, likely migraine attack.   PLAN:  Admit.  We will consult neurology again for reevaluation, treat  headache with routine migraine therapy per protocol.   HOSPITAL COURSE:  The patient was admitted and started on analgesic  antiemetic-type medications and had very little response to therapy.  Neurology was consulted again and agreed that migraine was the most  probable diagnosis.  The patient was treated with analgesic and  antiemetics for clinical symptoms of migraine.  She was also started on  beta-blocker and the blood pressure was treated with Aldomet.  The  patient's hypokalemia was also corrected and she was transferred  to  [facility] on hospital day #3 after being cleared by Neurology.  She continued to have headache on hospital day #3 and 4, but by hospital  day #5, she had no headache and on evaluation hospital day #6, the  headache still had not returned and the patient felt much improved.  She  was therefore discharged home on hospital day  #6 at [redacted] weeks gestation much improved, with headache resolved.   CONSULTATIONS:  At [facility] was done by Dr. Renaye Rakers, the  patient's primary medical doctor and hypertension specialist and  adjustments to the patient's blood pressure medicine was recommended  along with starting medication for depression.  The patient's Aldomet  was increased to 500 mg twice a day and she was started on Wellbutrin  for depression.  The patient was discharged home in good condition to be  followed up at the office at [facility] in 1 week.   DISCHARGE LABS:  Potassium on the day of discharge was 3.8.  Ultrasound  was within normal limits with measurements at 14 weeks' gestation,  normal anatomy that was seen and normal amniotic fluid.   DISCHARGE DISPOSITION:  Medications:  Continue prenatal vitamins,  Aldomet 500 mg twice a day, Fioricet for headache, Wellbutrin for  depression.  Routine instructions were given for migraine precautions.  The patient is to call the office for followup appointment in 1 week.      Charles A. Clearance Coots, M.D.  Electronically Signed     CAH/MEDQ  D:  12/20/2007  T:  12/21/2007  Job:  [id number]   cc:   Deanna Artis. Sharene Skeans, M.D.  Fax: [phone]   Renaye Rakers, M.D.  Fax: [phone]   [facility]

## 2010-12-06 NOTE — Consult Note (Signed)
[name], [name]              ACCOUNT NO.:  [id number]   MEDICAL RECORD NO.:  [id number]          PATIENT TYPE:  INP   LOCATION:  1521                         FACILITY:  [facility]   PHYSICIAN:  Casimiro Needle L. Reynolds, M.D.DATE OF BIRTH:  20-Dec-1975   DATE OF CONSULTATION:  12/15/2007  DATE OF DISCHARGE:                                 CONSULTATION   REFERRING PHYSICIAN:  Herbie Saxon, MD   PRIMARY CARE PHYSICIAN:  Roseanna Rainbow, M.D. and Renaye Rakers,  M.D.   REASON FOR EVALUATION:  Headache.   HISTORY OF PRESENT ILLNESS:  This is an inpatient consultation  evaluation of this existing [facility] patient, a 35-  year-old woman who was evaluated 3 days ago by Dr. Sharene Skeans for  precisely the same complaint.  At that time, she presented to the  emergency department complaining of persistent occipital headache which  had been going on for about 2 weeks.  She had been taking Tylenol for  this headache.  At that time her neurologic examination was  unremarkable, and she complained of blurry vision in the left eye and  was found to have conjunctivitis.  Dr. Darl Householder impression was that  she had migraine.  She had an MRI of the brain with MRA and MRV of the  intracranial circulation, all of which were unremarkable.  She was sent  home from the emergency department but came back a day later complaining  of ongoing headache and not resting well.  In spite of her active  pregnancy, she was admitted to [facility].  For the last two days, she  has received various pain medications including Tylenol, Fioricet,  Dilaudid and various nausea medications including Phenergan and  Compazine.  She has also been started on low dose of Inderal.  However,  she continues to complain of headache.  Neurologic consultation is  requested.   She tells me today that she continues to have a persistent headache,  although right now it is not too bad.  She has had a headache  for 3  weeks, just has been persistent since 4 days ago.  It is mostly  occipital with some radiation down into the left arm, although both  sides of her neck hurt.  Headache does come around to the front a little  bit.  It is throbbing in character, particularly when she lies down.  She complains of associated nausea, vomiting, photophobia, and  phonophobia.  She does report a premorbid history of headaches, which  were severe enough they were not very well relieved with Tylenol, but  really were not all that throbbing in character and would not make her  go home from work.  She reports these would happen about twice a month  over the last few years.  She has never taken anything for these other  than Tylenol.   PAST MEDICAL HISTORY:  Remarkable for:  1. Hypertension for which she is on medications.  2. She is also presently 13-[redacted] weeks pregnant.   FAMILY HISTORY, SOCIAL HISTORY, REVIEW OF SYSTEMS:  Per admission H&P of  Dec 13, 2007,  which was reviewed.   MEDICATIONS:  On admission, she was receiving:  1. Prenatal vitamins.  2. Reglan 5 mg  3 times daily.  3. Aldomet.  4. Compazine.   Here, she is receiving:  1. Propranolol 10 mg b.i.d.  2. Intravenous ceftriaxone and ampicillin for unclear reasons.  3. Aldomet.  4. Reglan 5 mg q. 6 h.  5. Prenatal vitamin.  6. Several p.r.n. medications as noted above.   PHYSICAL EXAMINATION:  VITAL SIGNS:  Temperature 97.4, blood pressure  147/88, pulse 67, respirations 18, O2 saturation 99% on room air.  GENERAL:  This is a healthy-appearing lying supine in hospital in no  evident distress.  HEAD:  Cranium normocephalic and atraumatic.  Oropharynx benign.  NECK:  Supple without carotid or supraclavicular bruits.  She is tender  to palpation over the cervical, paraspinal, and trapezii, but there is  absolutely no evidence of meningismus.  HEART:  Regular rate and rhythm without murmur.  NEUROLOGIC:  Mental status:  She is awake and  alert.  She is fully  oriented to time, place, and person.  Recent and remote memory are  intact.  Attention span, concentration, and fund of knowledge are all  appropriate.  Speech is fluent and not dysarthric.  She is able to name  objects and repeat phrases. She does not look in particular distress.  Cranial nerves:  Funduscopic exam is benign, specifically with vigorous  venous pulsations and no elevation of the optic disks.  Pupils were  equal and reactive.  Extraocular movements full.  No nystagmus.  Visual  fields full to confrontation.  Hearing is intact for conversational  speech.  Facial sensation is intact to pinprick.  Face, tongue, and  palate move normally and symmetrically.  Shoulder shrug strength is  normal.  Motor testing:  Normal bulk and tone.  Normal strength in all  tested muscles.  Sensation intact to pinprick, light touch, and double  stimulation all extremities.  Coordination:  Rapid movements performed  accurately.  Finger-to-nose performed accurately.  Gait:  She arises  easily from the bed, and her stance is normal.  She is able to take a  few steps without difficulty.  Reflexes 2+ and symmetric. Toes are  downgoing bilaterally.   LABORATORY REVIEW:  CBC from this morning:  White count 11.4, hemoglobin  11.8, platelets 220,000.  BMET from this morning is normal.   MRI of the brain from Dec 12, 2007, is reviewed and is as above.   IMPRESSION:  1. New onset daily headache, likely transformed migraine.  This is the      same conclusion that Dr. Sharene Skeans reached 3 days ago.  2. Intrauterine pregnancy at 13-14 weeks.  3. Left arm pain, likely musculoskeletal in the setting of above.  It      may also be triggering some of the above.  No clinical evidence for      radiculopathy.   RECOMMENDATIONS:  1. Will proceed with some prophylactic medication which would be safe      in a pregnant individual, including riboflavin, magnesium, and      pindolol 5 mg b.i.d.   Acutely, she can have Tylenol with narcotic.      Would avoid Fioricet which causes rebound headache . A steroid      taper may be useful.  I think she would benefit a lot from physical therapy for her neck as  well as scheduled Flexeril 5 mg 3 times a day which is safe  with  pregnancy, as well as local heat.  She would also benefit from sleeping regularly, and, to that end, I have  put her on Benadryl 25 mg nightly.  Ambien might also be useful.  My clinical suspicion for meningitis is zero.  Consider discontinuation of antibiotics, although there may be some  infection somewhere accounting for her  elevated white count.  She may be discharged whenever her symptoms allow.   Thank you for the consultation.      Michael L. Thad Ranger, M.D.  Electronically Signed     MLR/MEDQ  D:  12/15/2007  T:  12/15/2007  Job:  [id number]   cc:   Herbie Saxon, MD   Roseanna Rainbow, M.D.  Fax: [phone]   Renaye Rakers, M.D.  Fax: [phone]

## 2010-12-06 NOTE — Discharge Summary (Signed)
[name], [name]              ACCOUNT NO.:  [id number]   MEDICAL RECORD NO.:  [id number]          PATIENT TYPE:  INP   LOCATION:  1521                         FACILITY[facility]  [facility]   PHYSICIAN:  Herbie Saxon, MDDATE OF BIRTH:  08-31-1975   DATE OF ADMISSION:  12/13/2007  DATE OF DISCHARGE:  12/16/2007                               DISCHARGE SUMMARY   PRIMARY CARE PHYSICIAN:  Renaye Rakers, M.D.   DISCHARGE DIAGNOSES:  1. Migraine headaches.  2. Hypertension, stable.  3. First trimester of pregnancy.  4. Hypokalemia, on supplementation.  5. Leukocytosis, resolved.  6. Anemia.  7. Monocytosis.   CONSULTATIONS:  1. Kelli Hope, M.D., Neurology.  2. Dr. Alwyn Ren, Obstetrics & Gynecology.   DIAGNOSTIC STUDIES:  The MRA and MRI of the head and neck on Dec 12, 2007 showed no acute infarct.   HOSPITAL COURSE:  This 35 year old Gravida 3, Para 2, Hispanic female of  [redacted] weeks gestation complained of headache, which has been ongoing for 2  weeks. The headache is occipital and sometimes frontal, radiating to  both sides of her neck and arm. Occasionally associated with chills,  nausea, photophobia. The patient has been tried on numerous analgesics,  Tylenol, Dilaudid, which the patient seems not to be improving as her  headache remains an 8 to 9 over 10. On presentation, patient was noted  to have leukocytosis, though urine examination was normal. Chest was  clear. There was suspicion of a viral syndrome to rule out underlying  meningismus, as the patient was having muscle neck spasms. The patient  was empirically started on IV ampicillin and Rocephin. The leukocytosis  has resolved. The patient remains afebrile, however, headache persists.  Neurology was consulted. . Transfer to the [facility] for optimal  care. The patient had to be repleted with potassium 3 times and she is  being on potassium supplementation. Her blood pressure control has been  optimized with  Aldomet therapy. The neurologist recommended Riboflavin,  magnesium  and pindolol treatment  which has been added to her  medication regimen. She is on narcotic Dilaudid p.r.n. Also Flexeril  muscle relaxant has been added for treatment of neck spasms.   CONDITION ON DISCHARGE:  Stable.   DIET:  Low-sodium, heart healthy.   ACTIVITY:  Bedrest for now.   FOLLOWUP:  1. With Dr. Tamela Oddi or doctor at [facility] today.  2. She is to have a BMP check in the morning to monitor potassium      level.   DISCHARGE MEDICATIONS:  1. Magnesium 500mg   b.i.d.  2. Riboflavin 100 mg b.i.d.  3. Pindolol 5 mg bid .  4. Flexeril 5 mg t.i.d.  5. Benadryl 25 mg nightly for insomnia.  6. Phenergan 12.5 mg IV q.8 hours p.r.n. nausea.  7. Dilaudid 1 mg IV q.3 hours p.r.n. as needed for severe headache and      pain.  8. Aldomet 250 mg b.i.d.  9. K-Dur 20 meq daily for 2 more days.   PHYSICAL EXAMINATION:  GENERAL:  Not in acute distress.  VITAL SIGNS:  Temperature 98, pulse 77, respiratory rate  18, blood  pressure 132/85.  HEENT:  Pupils are equal, round, and reactive to light and  accommodation. Moist mucous membranes. Head is atraumatic,  normocephalic. Nasopharynx clear.  SKIN:  She is clinically pale and not jaundiced. There is no cyanosis.  NECK:  Supple. No nuchal rigidity. No sublingual lymphadenopathy. No  thyroid mass. No carotid bruit.  CHEST:  Clinically clear. S1 and S2. Regular rate and rhythm.  ABDOMEN: Soft, nontender. No masses palpated. Inguinal orifices are  patent.  NEUROLOGIC:  Alert and oriented to person, place, and time. Cranial  nerves 2-12 are intact. Motor system normal. Deep tendon reflexes 2+  globally. The sensory system is normal.   LABORATORY DATA:  Sodium 128, potassium 3.3, chloride 106, bicarb 23,  glucose 97, BUN 6, creatinine 0.5. WBC is 10.5. Hematocrit 32. Platelet  count is 219,000. Pending labs are to be followed up at the [facility] is  herpes simplex serology, Lyme serology, and Epstein-Barr viral  serology was also sent. Blood cultures also need to be followed up at  the [facility], once she is transferred. May consider cervical spine  x-ray cervical spine arthritis.      Herbie Saxon, MD  Electronically Signed     MIO/MEDQ  D:  12/16/2007  T:  12/16/2007  Job:  [id number]   cc:   Renaye Rakers, M.D.  Fax: [phone]   Roseanna Rainbow, M.D.  Fax: [phone]   Marolyn Hammock. Thad Ranger, M.D.  Fax: [phone]

## 2010-12-06 NOTE — Consult Note (Signed)
[name]  YAJAHIRA, TISON              ACCOUNT NO.:  [id number]   MEDICAL RECORD NO.:  [id number]           PATIENT TYPE:   LOCATION:                                 FACILITY:   PHYSICIAN:  Levert Feinstein, MD               DATE OF BIRTH:   DATE OF CONSULTATION:  DATE OF DISCHARGE:  04/15/2008                                 CONSULTATION   CHIEF COMPLAINT:  Headache.   HISTORY OF PRESENT ILLNESS:  The patient is a 35 year old Hispanic  female, status post C-section in April 04, 2008, 28 weeks pregant,  due to preclampsia, HTN, protein uria, presenting with headache.   She was seen by my partners 3 times during her most recent pregnancy,  and she denied a previous history of migraine, but developed a  persistent headache during her most recent pregnancy, and has been  persistent throughout the whole process and worsening in past 3 months,  there was no significant change post delivery.  She stays at the [facility]  ICU because persistent elevated blood pressure in the range of 130-  150/80-97.   The patient describes persistent bilateral frontal pressure headache,  but often times exacerbate to left retroorbital, temporoparietal  pounding headache, with associated nausea, photo, phonophobia, and is  usually associated with mild elevated blood pressure, however, it can  also happen independently without change in blood pressure.  She has  been receiving Stadol along with Phenergan multiple dosage during this  hospital stay, reported that it does relieve her headache, but recurred  couple hours later.   There was no associated visual change, lateralized motor or sensory  deficit.   For evaluation, she has received MRI of the brain and MRV of the brain  in Dec 12, 2007, that was normal, I have personally reviewed the film.   In addition, she reported she tried Fioricet, no help, but never tried  triptan medication before.  There was no family history of migraine  headaches.  There was no  similar presentation due to her previous 2  pregnancies.  She is also depressed, evaluated previously by  psychiatrist, currently on Zoloft.   She also complains of difficulty falling to sleep, wakes up multiple  times during the nighttime.   PAST MEDICAL HISTORY:  Preeclampsia, hypertension, depression, and  obesity.   PAST SURGICAL HISTORY:  Cholecystectomy and C-section x2 for 2 premature  children.   CURRENT MEDICATIONS:  Stadol, enalapril, hydrochlorothiazide, labetalol,  Ativan, magnesium oxide, nifedipine, Zoloft, Phenergan, and Percocet.   ALLERGIES:  No known drug allergy.   SOCIAL HISTORY:  She lives with her husband, mother, and 2 children at  home.  Denies smoke or drink, works as a Restaurant manager, fast food.   PHYSICAL EXAMINATION:  VITAL SIGNS:  Blood pressure 134-151/81-97.  CARDIAC:  Regular rate and rhythm.  PULMONARY:  Clear to auscultation bilaterally.  NECK:  Supple.  No carotid bruits.  NEUROLOGICAL:  She is a depressed-looking young Hispanic female, alert  and oriented to history-taking and carrying on a conversation.  Cranial  nerves II through XII,  pupils are equal, round, and reactive to light.  Bilateral fundi was sharp.  Extraocular movements were full.  Visual  fields were full to confrontational test.  Facial sensation and strength  was normal.  Uvula and tongue midline.  Head turning and shoulder  shrugging were normal and symmetric.  Hearing was intact to finger  rubbing bilaterally.  Mood examination normal tone, bulk, and strength.  Sensory normal to light touch and vibratory sensation.  Deep tendon  reflex normal and symmetric.  Plantar responses were flexor.  Gait was  narrow based and steady.  She was able to perform heel and tip-toe  walking without difficulty.  Coordination, normal finger-to-nose, heel-  to-shin, no dysmetria.   ASSESSMENT AND PLAN:  A 35 year old Hispanic female presenting with  persistent headaches, with mildly elevated blood  pressure.  The headache  has the feature of tension headache, likely medicine-withdrawal  headache, and severe pounding headache, has some migraine features.   Plans as following:  1. It is not a good idea for her to be on a long-term opioid      treatment, better stop Stadol.  2. Keep beta-blocker, calcium-blocker, and magnesium oxide, which are      often used as headache prevention.  3. May consider adding other preventive medications such as      nortriptyline, or even antiepileptic medications, but the patient      is planning on breast feeding, so we will hold on adding more      medications at current stage.  4. Optimize blood pressure control.  5. May consider Imitrex or other triptan as abortive treatment.  I      have looked up Micromedex, it is compatible with breastfeeding,      just make sure blood pressure is less than 130/80 prior to giving      the patient the medications.  6. The other abortive treatment with p.r.n. Toradol, Phenergan, and      Compazine.  7. No further imaging studies needed, because normal neurological      examination, previous normal MRI of the brain and MRV of the brain.  8. Follow up [facility] upon discharge.      Levert Feinstein, MD  Electronically Signed     YY/MEDQ  D:  04/13/2008  T:  04/14/2008  Job:  [id number]

## 2010-12-06 NOTE — Consult Note (Signed)
[name]  [name], [name]              ACCOUNT NO.:  [id number]   MEDICAL RECORD NO.:  [id number]          PATIENT TYPE:  INP   LOCATION:  9303                          FACILITY:  [facility]   PHYSICIAN:  Antonietta Breach, M.D.  DATE OF BIRTH:  06/16/1976   DATE OF CONSULTATION:  12/27/2007  DATE OF DISCHARGE:                                 CONSULTATION   REASON FOR CONSULTATION:  Depression.   REQUESTING PHYSICIAN:  Janine Limbo, M.D.   HISTORY OF PRESENT ILLNESS:  [name] was admitted to the [facility] [place] on December 25, 2007, due to hypertension at [redacted] weeks  gestation.  The patient has greater than 2 weeks of depressed mood,  decreased energy, difficulty concentrating, poor interests, easy crying,  decreased appetite and disruption of sleep.  She is not having any  thoughts of harming herself or others.  She has no hallucinations or  delusions.  She does look forward to having the baby.  She does not have  any intention of breastfeeding.  She does have a supportive husband and  has two children.  Please see the past history and general medical data  below.   PAST PSYCHIATRIC HISTORY:  The patient has no history of increased  energy, decreased need for sleep.  She has no history of hallucinations  or other psychosis.  She has no history of psychiatric hospital  admissions or suicide attempts.  She does have a history of a prior  depressive episode and was treated with Prozac.  She states that the  Prozac helped her energy a little bit but otherwise was not effective.   FAMILY PSYCHIATRIC HISTORY:  None known.   SOCIAL HISTORY:  No alcohol or illegal drugs.  The patient has two  children.  She is married to a supportive husband.   PAST MEDICAL HISTORY:  Hypertension, migraines, history of C-section.   MEDICATIONS:  The MAR is reviewed.  She is on Aldomet 500 mg t.i.d.   ALLERGIES:  No known drug allergies.   LABORATORY DATA:  SGOT 24, SGPT 43.   REVIEW OF  SYSTEMS:  Constitutional, HEENT, neurologic, psychiatric,  cardiovascular, respiratory, gastrointestinal, genitourinary, skin,  musculoskeletal, hematologic, lymphatic, endocrine and metabolic all  unremarkable.   PHYSICAL EXAMINATION:  VITAL SIGNS:  Temperature 97.2, pulse 67,  respiratory rate 20, blood pressure 126/83, O2 saturation on room air  98%.  GENERAL APPEARANCE:  [name] is a young female sitting up in her  hospital bed with good eye contact.  No distress.  She has no abnormal  involuntary movements.   MENTAL STATUS EXAM:  [name] is alert.  She is oriented to all  spheres.  Her eye contact is intact.  Her attention span is within  normal limits.  Her concentration is mildly decreased.  Her mood is  depressed.  Affect is constricted.  Memory is intact to immediate,  recent and remote.  Fund of knowledge and intelligence within normal  limits.  Speech is soft with normal rate and prosody without dysarthria.  Thought process is logical, coherent, goal-directed.  No looseness of  associations.  Thought content - no thoughts of harming herself, no  thoughts of harming others, no delusions, no hallucinations.  Insight is  intact.  Judgment is intact.   ASSESSMENT:  AXIS I:  293.83 mood disorder not otherwise specified,  depressed (general medical and functional elements).  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  AXIS II:  Deferred.  AXIS III:  See the past medical history above.  AXIS IV:  General medical.  AXIS V:  55.   [name] is not at risk to harm herself or others.  She agrees to  call emergency services immediately for any thoughts of harming herself,  thoughts of harming others or distress.   The undersigned provided ego supportive psychotherapy and education.   The indications, alternatives and adverse effects of Celexa were  discussed with the patient including the potential risk of harm to the  baby.  The discussion also included  information on other antidepressants  that had substantiated data regarding their danger for pregnancy.  The  discussion also included research on medication in pregnancy is limited  and that potential harm from Celexa to the baby still exists.  The  patient understood this information and wanted to think about taking the  Celexa for a little while before making her decision.   The patient does not meet criteria for forced care.   RECOMMENDATIONS:  If the patient decides to take Celexa would start at  10 mg p.o. q. a.m. and if tolerated would increase to 20 mg p.o. q. a.m.  at the end of the first week.   Regarding other modalities of treatment, would ask the social worker to  set this patient up with outpatient psychiatric care during the first  week of discharge to include medication management as well as  psychotherapy.  The patient could benefit from ego supportive  psychotherapy as well as potentially cognitive behavioral therapy.      Antonietta Breach, M.D.  Electronically Signed     JW/MEDQ  D:  12/27/2007  T:  12/27/2007  Job:  [id number]

## 2010-12-09 NOTE — H&P (Signed)
[name]  [name], [name] NO.:  [id number]   MEDICAL RECORD NO.:  [id number]           PATIENT TYPE:   LOCATION:                                 FACILITY:   PHYSICIAN:  Rudy Jew. Ashley Royalty, M.D.     DATE OF BIRTH:   DATE OF ADMISSION:  DATE OF DISCHARGE:                                HISTORY & PHYSICAL   This is a 35 year old, gravida 3, para 0-1-1-1, [facility] May 20, 2005,  currently 29 weeks 6 days' gestation.  Prenatal care has been complicated by  a history of chronic hypertension, herpes simplex virus type 2, and  transient elevation of the SGPT liver enzyme.  The patient is not on any  current antihypertensive and initial blood pressure during this gestation  was 120/80.  She had a hepatitis panel performed during the pregnancy which  was negative.  She had an SGPT, on or about October 31, 2004,  which revealed  a value of 44 which was barely over the maximum normal of 40 units per  liter.  Subsequent determination, December 22, 2004, revealed a SGP of 26, and  other parameters within normal limits as well.  The patient presented today  for a routine antenatal visit.  Blood pressure was noted to be 166 to 170  over 100.  She denies any visual disturbances, headaches, right upper  quadrant or epigastric pain.  She is admitted for observation.   MEDICATIONS:  Prenatal vitamins and Reglan.   PAST MEDICAL HISTORY:   MEDICAL:  History of chronic hypertension.   SURGICAL:  Cesarean section, 2002.   ALLERGIES:  None.   FAMILY HISTORY:  Noncontributory.   SOCIAL HISTORY:  The patient denies use of tobacco or alcohol.   REVIEW OF SYSTEMS:  Noncontributory.   PHYSICAL EXAMINATION:  GENERAL:  A well-developed, well-nourished, pleasant  female in no acute distress.  VITAL SIGNS:  Afebrile, blood pressure as above, vital signs stable.  SKIN:  Warm and dry without lesions.  LYMPH:  There is no supraclavicular, cervical, or inguinal adenopathy.  HEENT:  Normocephalic.  NECK:  Supple without thyromegaly.  CHEST/LUNGS:  Clear.  CARDIAC:  Regular rate and rhythm without murmurs, gallops, or rubs.  BREAST:  Deferred.  ABDOMEN:  Soft and nontender.  Fundal height is approximately 29-cm.  Fetal  heart tones are auscultated.  MUSCULOSKELETAL:  Reveals full range of motion without significant edema or  CVA tenderness.  DTRs are 2+ and equal.  PELVIC:  Deferred.   IMPRESSION:  1.  Intrauterine pregnancy at 29 weeks 6 days' gestation.  2.  History of chronic hypertension.  3.  History of herpes simplex, type 2.  4.  Transient SGPT elevation in pregnancy, resolved.  5.  Hypertension, chronic versus pregnancy induced.   PLAN:  1.  Admit.  2.  We will check labs.  3.  OB ultrasound.  4.  Obtain numerous blood pressure determinations.  5.  The patient is known to Dr. Mia Creek who is covering March 10, 2005,      March 11, 2005, and March 12, 2005.  ______________________________  Rudy Jew Ashley Royalty, M.D.     JAM/MEDQ  D:  03/10/2005  T:  03/10/2005  Job:  [id number]

## 2010-12-09 NOTE — Op Note (Signed)
[name], [name]              ACCOUNT NO.:  [id number]   MEDICAL RECORD NO.:  [id number]          PATIENT TYPE:  INP   LOCATION:  9165                          FACILITY:  [facility]   PHYSICIAN:  Ginger Carne, MD  DATE OF BIRTH:  March 03, 1976   DATE OF PROCEDURE:  03/11/2005  DATE OF DISCHARGE:                                 OPERATIVE REPORT   PREOPERATIVE DIAGNOSES:  1.  Severe preeclampsia.  2.  Thirty weeks' gestation.  3.  Previous cesarean section.   POSTOPERATIVE DIAGNOSES:  1.  Severe preeclampsia.  2.  Thirty weeks' gestation.  3.  Previous cesarean section.  4.  Preterm viable delivery of female infant.   PROCEDURE:  Is repeat cesarean section, low vertical incision.   SURGEON:  Ginger Carne, M.D.   ASSISTANT:  None.   COMPLICATIONS:  None immediate.   ESTIMATED BLOOD LOSS:  650 mL.   SPECIMEN:  Cord bloods.   ANESTHESIA:  Spinal.   OPERATIVE FINDINGS:  A preterm infant female delivered in a vertex  presentation.  Apgar weight per delivery room record.  No gross  abnormalities.  Baby cried spontaneously at delivery.  NICU staff present.  Uterus, tubes and ovaries showed normal decidual changes of pregnancy.  Placenta was complete, central insertion of cord, three vessels.  Amniotic  fluid was clear.   OPERATIVE PROCEDURE:  The patient prepped and draped in the usual fashion  and placed in left lateral supine position.  Betadine solution used for  antiseptic and the patient was catheterized prior to the procedure.  After  adequate spinal analgesia, a Pfannenstiel incision was made and the abdomen  opened.  Lower uterine segment incised as a low vertical incision.  The baby  was delivered, the cord clamped and cut, and infant given to the pediatric  staff after bulb suctioning.  Placenta removed manually.  Uterus inspected.  Closure of the uterine musculature in one layer of 0 Vicryl running  interlocking suture.  Bleeding points  hemostatically checked.   Blood clots removed.  Closure of the fascia with 0  Vicryl running interlocking suture and skin staples for the skin.  Instrument and sponge count were correct.  The patient tolerated the  procedure well, returned to the post anesthesia recovery room in excellent  condition.      Ginger Carne, MD  Electronically Signed     SHB/MEDQ  D:  03/11/2005  T:  03/11/2005  Job:  [id number]

## 2010-12-09 NOTE — Discharge Summary (Signed)
[name], [name]              ACCOUNT NO.:  [id number]   MEDICAL RECORD NO.:  [id number]          PATIENT TYPE:  INP   LOCATION:  9303                          [facility]ACILITY:  [facility]   PHYSICIAN:  James A. Ashley Royalty, M.D.DATE OF BIRTH:  1976/02/14   DATE OF ADMISSION:  03/10/2005  DATE OF DISCHARGE:  03/15/2005                                 DISCHARGE SUMMARY   DISCHARGE DIAGNOSES:  1.  Intrauterine pregnancy at 29 weeks 6 days' gestation, delivered.  2.  History of chronic hypertension.  3.  History of herpes simplex virus type 2.  4.  Preeclampsia with possible element of chronic hypertension.   OPERATIONS AND SPECIAL PROCEDURES:  Primary cesarean section (low vertical).   CONSULTATIONS:  None.   DISCHARGE MEDICATIONS:  1.  Labetalol 200 mg p.o. b.i.d.  2.  Percocet for pain.  3.  Motrin 600 mg p.o. q.i.d. p.r.n. pain.   HISTORY AND PHYSICAL:  This is a 35 year old gravida 3, para 0-1-1-1,  currently 74 weeks' gestation on the day of admission.  Prenatal care was  complicated by a history of chronic hypertension, HSV type 2, and transient  elevation of SGPT liver enzyme.  The patient was not on any antihypertensive  medications at the time of admission.  Hepatitis panel was negative.  Subsequent SGPT determination December 22, 2004, revealed a value of 26 only.  The patient presented on the day of admission for routine antenatal visit.  Blood pressure was noted to be 166-170/100 diastolic.  She denied any  severe preeclampsia symptoms.  She was admitted.   HOSPITAL COURSE:  The patient was admitted to [facility]  [place].  Admission laboratory studies were drawn.  On August 19 the  patient was managed by Dr. Mia Creek and he noted that she began having  midepigastric pain.  Blood pressures were elevated.  __________  presenting  part.  The diagnosis of a severe preeclampsia was made.  Low vertical  cesarean section.  The procedure yielded a  (1563 g) female, Apgars 8 at  one  minute, 9 at five minutes, sent to the newborn nursery.  The patient's  postpartum course was complicated by some residual hypertension, which  __________  and responded nicely to that drug.  Magnesium sulfate was  discontinued and the blood pressure returned to acceptable levels at  discharge.  Hence, the patient was felt to be stable for discharge and was  discharged home on March 15, 2005, afebrile and in a satisfactory  condition.   ACCESSORY CLINICAL FINDINGS:  Hemoglobin and hematocrit on admission were  11.4 and __________ , respectively.  Repeat values on March 11, 2005, were  11.5 and 34.1, respectively.  Repeat value obtained March 12, 2005, was 9.6  and 29.1, respectively.  PIH panels were essentially within normal limits.   The patient is to return to [facility] in two days  for postpartum evaluation.      James A. Ashley Royalty, M.D.  Electronically Signed     JAM/MEDQ  D:  05/16/2005  T:  05/16/2005  Job:  [id number]

## 2010-12-09 NOTE — Op Note (Signed)
NAME:  [name], [name]                        ACCOUNT NO.:  [id number]   MEDICAL RECORD NO.:  [id number]                   PATIENT TYPE:  OBV   LOCATION:  0448                                 FACILITY:  [facility]   PHYSICIAN:  Timothy E. Earlene Plater, M.D.              DATE OF BIRTH:  Jan 15, 1976   DATE OF PROCEDURE:  02/11/2004  DATE OF DISCHARGE:                                 OPERATIVE REPORT   PREOPERATIVE DIAGNOSIS:  Cholecystolithiasis.   POSTOPERATIVE DIAGNOSIS:  Cholecystolithiasis   PROCEDURE:  Laparoscopic cholecystectomy and operative cholangiogram.   SURGEON:  Timothy E. Earlene Plater, M.D.   ASSISTANT:  Leonie Man, M.D.   ANESTHESIA:  General.   [name] is a healthy 35 year old Hispanic female with new onset of  symptoms of gallbladder disease with documented stones.  Today, she has  normal liver function studies and is scheduled urgently, at her request.  She is otherwise healthy and without health problems.  She was identified,  and the permit signed.   She was taken to the operating room and placed supine.  General endotracheal  anesthesia administered.  The abdomen was prepped and draped in the usual  fashion.  Marcaine 0.25% with epinephrine was used throughout prior to each  incision.  An infraumbilical incision made.  The fascia identified.  Opened  vertically.  The peritoneum entered without complications.  A suture placed  across the fascia, and the Hasson catheter passed through the sutures and  tied in place.  The abdomen insufflated.  General peritoneoscopy was  unremarkable except for one adhesion to the lower abdominal wall.  The  gallbladder did not appear acute.  A second 10 mm trocar placed in the  epigastrium.  Two 5 mm trocars in the right upper quadrant.  The gallbladder  was grasped, placed on tension, and carefully dissected at the base of the  gallbladder.  Revealed a normal-appearing cystic duct which was dissected  out completely.  A window  created.  No other structures except the artery  noted.  A clip was placed on the gallbladder side of the cystic duct.  A  small opening made.  A cholangiogram catheter passed percutaneously and into  the cystic duct stump.  Clipped in place.  Cholangiography carried out  without complications.  It appeared normal with full filling of the biliary  tree and smooth flow into the duodenum.  The clip and catheter were removed.  The stump at the cystic duct triply clipped.  It was divided.  The artery  was triply clipped and divided in two locations.  The gallbladder was  removed from the gallbladder bed without incident, complication, and  spillage.  The irrigants were clear.  The gallbladder was grasped and pulled  from the abdomen through the infraumbilical incision, which was tied under  direct vision.  Copious irrigation was carried out and remained clear.  All  irrigants sealed.  The two  instruments  and trocars removed under direct vision.  Each incision checked  for bleeding.  Cautery was used when necessary.  The wounds were closed with  subcuticular 3-0 Monocryl.  Steri-Strips applied.  All counts correct.  She  was awakened and taken to the recovery room in good condition.                                               Timothy E. Earlene Plater, M.D.    TED/MEDQ  D:  02/11/2004  T:  02/11/2004  Job:  [id number]   cc:   Gregary Signs A. Everardo All, M.D. [facility]

## 2011-04-18 LAB — DIFFERENTIAL
Basophils Absolute: 0.1
Basophils Relative: 0
Eosinophils Absolute: 0.6
Eosinophils Relative: 5
Lymphocytes Relative: 24
Lymphs Abs: 2.9
Monocytes Absolute: 1.4 — ABNORMAL HIGH
Monocytes Relative: 11
Neutro Abs: 7.3
Neutrophils Relative %: 59

## 2011-04-18 LAB — CBC
HCT: 35 — ABNORMAL LOW
HCT: 35.7 — ABNORMAL LOW
Hemoglobin: 12
Hemoglobin: 12.2
MCHC: 34.2
MCHC: 34.2
MCV: 83.5
MCV: 83.7
Platelets: 271
Platelets: 273
RBC: 4.2
RBC: 4.27
RDW: 13.8
RDW: 14
WBC: 11.1 — ABNORMAL HIGH
WBC: 12.4 — ABNORMAL HIGH

## 2011-04-19 LAB — BASIC METABOLIC PANEL
BUN: 6
CO2: 22
CO2: 22
CO2: 23
Calcium: 8.5
Calcium: 9.5
Chloride: 105
Chloride: 106
Chloride: 106
Chloride: 107
Creatinine, Ser: 0.5
Creatinine, Ser: 0.51
GFR calc Af Amer: >60
GFR calc Af Amer: >60
GFR calc Af Amer: >60
GFR calc non Af Amer: >60
Glucose, Bld: 77
Glucose, Bld: 87
Glucose, Bld: 97
Potassium: 3.3 — ABNORMAL LOW
Potassium: 3.3 — ABNORMAL LOW
Sodium: 133 — ABNORMAL LOW
Sodium: 135

## 2011-04-19 LAB — BETA-2-GLYCOPROTEIN I ABS, IGG/M/A
Beta-2 Glyco I IgG: 55 U/mL (ref <20)
Beta-2-Glycoprotein I IgA: <4 U/mL (ref <10)
Beta-2-Glycoprotein I IgM: <4 U/mL (ref <10)

## 2011-04-19 LAB — CBC
HCT: 32.9 — ABNORMAL LOW
HCT: 33.1 — ABNORMAL LOW
HCT: 36.2
Hemoglobin: 11.8 — ABNORMAL LOW
Hemoglobin: 12.1
Hemoglobin: 14
MCHC: 33.4
MCHC: 33.8
MCHC: 34.7
MCV: 82.8
MCV: 83.3
MCV: 83.6
MCV: 83.8
Platelets: 217
Platelets: 219
RBC: 4
RBC: 4.17
RBC: 4.33
RDW: 12.3
RDW: 12.6
RDW: 13.3
WBC: 10.5
WBC: 13.9 — ABNORMAL HIGH

## 2011-04-19 LAB — POCT I-STAT, CHEM 8
Calcium, Ion: 1.19
Creatinine, Ser: 0.7
Glucose, Bld: 84
HCT: 39
Hemoglobin: 13.3
TCO2: 22

## 2011-04-19 LAB — DIFFERENTIAL
Basophils Absolute: 0.1
Basophils Relative: 0
Basophils Relative: 1
Eosinophils Absolute: 0.2
Eosinophils Relative: 2
Lymphocytes Relative: 21
Lymphs Abs: 3.2
Monocytes Absolute: 1
Monocytes Relative: 9
Neutro Abs: 8 — ABNORMAL HIGH
Neutrophils Relative %: 57
Neutrophils Relative %: 68

## 2011-04-19 LAB — URINALYSIS, ROUTINE W REFLEX MICROSCOPIC
Bilirubin Urine: NEGATIVE
Bilirubin Urine: NEGATIVE
Glucose, UA: NEGATIVE
Glucose, UA: NEGATIVE
Hgb urine dipstick: NEGATIVE
Hgb urine dipstick: NEGATIVE
Protein, ur: NEGATIVE
Specific Gravity, Urine: 1.015
Urobilinogen, UA: 0.2
Urobilinogen, UA: 0.2

## 2011-04-19 LAB — COMPREHENSIVE METABOLIC PANEL
ALT: 31
AST: 25
Albumin: 3.1 — ABNORMAL LOW
Alkaline Phosphatase: 54
CO2: 21
Chloride: 106
GFR calc Af Amer: >60
GFR calc non Af Amer: >60
Potassium: 3.8
Total Bilirubin: 0.4

## 2011-04-19 LAB — CARDIOLIPIN ANTIBODIES, IGG, IGM, IGA: Anticardiolipin IgM: <7 — ABNORMAL LOW (ref <10)

## 2011-04-19 LAB — CULTURE, BLOOD (ROUTINE X 2): Culture: NO GROWTH

## 2011-04-19 LAB — PROTHROMBIN GENE MUTATION

## 2011-04-19 LAB — LUPUS ANTICOAGULANT PANEL: Lupus Anticoagulant: NOT DETECTED

## 2011-04-19 LAB — IGG: IgG (Immunoglobin G), Serum: 1830 — ABNORMAL HIGH

## 2011-04-19 LAB — PROTEIN S, TOTAL: Protein S Ag, Total: 71 % (ref 70–140)

## 2011-04-19 LAB — FACTOR 5 LEIDEN

## 2011-04-19 LAB — HOMOCYSTEINE: Homocysteine: 4.2

## 2011-04-19 LAB — HSV(HERPES SMPLX)ABS-I+II(IGG+IGM)-BLD: Herpes Simplex Vrs I&II-IgM Ab (EIA): NOT DETECTED

## 2011-04-19 LAB — HCG, SERUM, QUALITATIVE: Preg, Serum: POSITIVE — AB

## 2011-04-20 LAB — COMPREHENSIVE METABOLIC PANEL
ALT: 44 — ABNORMAL HIGH
ALT: 50 — ABNORMAL HIGH
AST: 31
Albumin: 2.8 — ABNORMAL LOW
Albumin: 3.2 — ABNORMAL LOW
Calcium: 8.3 — ABNORMAL LOW
Calcium: 9
Creatinine, Ser: 0.38 — ABNORMAL LOW
GFR calc Af Amer: >60
GFR calc Af Amer: >60
Glucose, Bld: 92
Potassium: 3 — ABNORMAL LOW
Sodium: 136
Sodium: 137
Total Protein: 7.1

## 2011-04-20 LAB — DIFFERENTIAL
Basophils Absolute: 0.1
Basophils Relative: 1
Eosinophils Absolute: 0.2
Eosinophils Relative: 2
Lymphocytes Relative: 26
Lymphs Abs: 2.4
Monocytes Absolute: 0.9
Monocytes Relative: 10
Neutro Abs: 5.5
Neutrophils Relative %: 62

## 2011-04-20 LAB — CBC
Hemoglobin: 11.6 — ABNORMAL LOW
MCHC: 34.4
Platelets: 240
RDW: 12.4

## 2011-04-20 LAB — URINALYSIS, ROUTINE W REFLEX MICROSCOPIC
Bilirubin Urine: NEGATIVE
Glucose, UA: NEGATIVE
Hgb urine dipstick: NEGATIVE
Ketones, ur: NEGATIVE
Nitrite: NEGATIVE
Protein, ur: NEGATIVE
Specific Gravity, Urine: 1.02
Urobilinogen, UA: 0.2
pH: 6.5

## 2011-04-20 LAB — HEPATIC FUNCTION PANEL
ALT: 43 — ABNORMAL HIGH
AST: 29
Albumin: 2.9 — ABNORMAL LOW
Alkaline Phosphatase: 41
Bilirubin, Direct: 0.1
Indirect Bilirubin: 0.1 — ABNORMAL LOW
Total Bilirubin: 0.2 — ABNORMAL LOW
Total Protein: 6.7

## 2011-04-20 LAB — PROTEIN, URINE, 24 HOUR
Collection Interval-UPROT: 24
Protein, 24H Urine: 80
Protein, Urine: 3
Urine Total Volume-UPROT: 2675

## 2011-04-20 LAB — CREATININE CLEARANCE, URINE, 24 HOUR
Creatinine Clearance: 257 — ABNORMAL HIGH
Creatinine, 24H Ur: 1407
Creatinine, Urine: 52.6
Creatinine: 0.38 — ABNORMAL LOW

## 2011-04-21 LAB — COMPREHENSIVE METABOLIC PANEL
ALT: 21
Calcium: 9
Glucose, Bld: 96
Sodium: 137
Total Protein: 6.6

## 2011-04-21 LAB — URINALYSIS, ROUTINE W REFLEX MICROSCOPIC
Bilirubin Urine: NEGATIVE
Ketones, ur: NEGATIVE
Nitrite: NEGATIVE
Protein, ur: NEGATIVE
Urobilinogen, UA: 0.2
pH: 6.5

## 2011-04-21 LAB — CBC
Hemoglobin: 11.2 — ABNORMAL LOW
MCHC: 33.5
RDW: 14

## 2011-04-21 LAB — URIC ACID: Uric Acid, Serum: 3.9

## 2011-04-21 LAB — LACTATE DEHYDROGENASE: LDH: 115

## 2011-04-24 LAB — COMPREHENSIVE METABOLIC PANEL
ALT: 41 — ABNORMAL HIGH
AST: 30
Alkaline Phosphatase: 72
Alkaline Phosphatase: 75
CO2: 20
CO2: 26
Chloride: 104
Chloride: 107
Creatinine, Ser: 0.63
GFR calc Af Amer: >60
GFR calc non Af Amer: >60
GFR calc non Af Amer: >60
Glucose, Bld: 127 — ABNORMAL HIGH
Potassium: 3.5
Potassium: 3.5
Sodium: 136
Total Bilirubin: 0.5

## 2011-04-24 LAB — CBC
Hemoglobin: 11.4 — ABNORMAL LOW
MCHC: 33.2
RBC: 4

## 2011-04-24 LAB — DIFFERENTIAL
Basophils Relative: 0
Eosinophils Absolute: 0.6
Neutrophils Relative %: 71

## 2011-04-24 LAB — URINALYSIS, ROUTINE W REFLEX MICROSCOPIC
Bilirubin Urine: NEGATIVE
Glucose, UA: NEGATIVE
Ketones, ur: NEGATIVE
Leukocytes, UA: NEGATIVE
Nitrite: NEGATIVE
Protein, ur: 100 — AB

## 2011-04-24 LAB — MAGNESIUM: Magnesium: 1.8

## 2011-04-26 LAB — GLUCOSE TOLERANCE, 1 HOUR: Glucose, 1 Hour GTT: 115

## 2011-04-26 LAB — COMPREHENSIVE METABOLIC PANEL
ALT: 23
ALT: 32
ALT: 34
AST: 28
AST: 29
Albumin: 2.2 — ABNORMAL LOW
Albumin: 2.3 — ABNORMAL LOW
Albumin: 2.6 — ABNORMAL LOW
Alkaline Phosphatase: 63
Alkaline Phosphatase: 74
Alkaline Phosphatase: 83
BUN: 10
BUN: 7
BUN: 7
BUN: 8
BUN: 9
CO2: 22
CO2: 23
CO2: 27
Calcium: 7.1 — ABNORMAL LOW
Calcium: 9.1
Calcium: 9.4
Chloride: 101
Chloride: 105
Chloride: 106
Chloride: 107
Creatinine, Ser: 0.44
Creatinine, Ser: 0.44
Creatinine, Ser: 0.46
Creatinine, Ser: 0.46
Creatinine, Ser: 0.47
GFR calc Af Amer: >60
GFR calc Af Amer: >60
GFR calc non Af Amer: >60
GFR calc non Af Amer: >60
GFR calc non Af Amer: >60
GFR calc non Af Amer: >60
Glucose, Bld: 107 — ABNORMAL HIGH
Glucose, Bld: 76
Glucose, Bld: 92
Glucose, Bld: 92
Glucose, Bld: 99
Potassium: 3.1 — ABNORMAL LOW
Potassium: 3.5
Potassium: 3.7
Sodium: 133 — ABNORMAL LOW
Sodium: 135
Sodium: 136
Total Bilirubin: 0.3
Total Bilirubin: 0.5
Total Bilirubin: 0.6
Total Bilirubin: 0.7
Total Protein: 5.4 — ABNORMAL LOW
Total Protein: 5.5 — ABNORMAL LOW
Total Protein: 6.4
Total Protein: 6.4

## 2011-04-26 LAB — CBC
HCT: 34.4 — ABNORMAL LOW
HCT: 35.5 — ABNORMAL LOW
HCT: 35.7 — ABNORMAL LOW
HCT: 37.6
Hemoglobin: 10.2 — ABNORMAL LOW
Hemoglobin: 11.6 — ABNORMAL LOW
Hemoglobin: 11.9 — ABNORMAL LOW
Hemoglobin: 12.5
MCHC: 33.2
MCHC: 33.3
MCV: 86.9
MCV: 86.9
MCV: 87.1
Platelets: 154
Platelets: 166
Platelets: 185
RBC: 4.07
RBC: 4.11
RDW: 14.7
RDW: 14.8
RDW: 14.9
RDW: 15
WBC: 10.2
WBC: 10.6 — ABNORMAL HIGH

## 2011-04-26 LAB — CREATININE CLEARANCE, URINE, 24 HOUR
Creatinine Clearance: 208 — ABNORMAL HIGH
Creatinine, 24H Ur: 1319
Creatinine: 0.44

## 2011-04-26 LAB — METANEPHRINES, URINE, 24 HOUR: Metaneph Total, Ur: 275 mcg/24 h (ref 115–695)

## 2011-04-26 LAB — URIC ACID
Uric Acid, Serum: 3.1
Uric Acid, Serum: 4.3

## 2011-04-26 LAB — LACTATE DEHYDROGENASE: LDH: 149

## 2011-04-26 LAB — MAGNESIUM: Magnesium: 4.5 — ABNORMAL HIGH

## 2011-04-26 LAB — RPR: RPR Ser Ql: NONREACTIVE

## 2012-11-21 ENCOUNTER — Encounter (HOSPITAL_COMMUNITY): Payer: Self-pay

## 2012-11-21 ENCOUNTER — Emergency Department (HOSPITAL_COMMUNITY): Payer: BC Managed Care – PPO

## 2012-11-21 ENCOUNTER — Emergency Department (HOSPITAL_COMMUNITY)
Admission: EM | Admit: 2012-11-21 | Discharge: 2012-11-22 | Disposition: A | Discharge status: Home / Self Care | Payer: BC Managed Care – PPO | Attending: Emergency Medicine | Admitting: Emergency Medicine

## 2012-11-21 DIAGNOSIS — M79671 Pain in right foot: Secondary | ICD-10-CM

## 2012-11-21 DIAGNOSIS — Z79899 Other long term (current) drug therapy: Secondary | ICD-10-CM | POA: Insufficient documentation

## 2012-11-21 DIAGNOSIS — W010XXA Fall on same level from slipping, tripping and stumbling without subsequent striking against object, initial encounter: Secondary | ICD-10-CM | POA: Insufficient documentation

## 2012-11-21 DIAGNOSIS — Y9389 Activity, other specified: Secondary | ICD-10-CM | POA: Insufficient documentation

## 2012-11-21 DIAGNOSIS — I10 Essential (primary) hypertension: Secondary | ICD-10-CM | POA: Insufficient documentation

## 2012-11-21 DIAGNOSIS — M25571 Pain in right ankle and joints of right foot: Secondary | ICD-10-CM

## 2012-11-21 DIAGNOSIS — X500XXA Overexertion from strenuous movement or load, initial encounter: Secondary | ICD-10-CM | POA: Insufficient documentation

## 2012-11-21 DIAGNOSIS — Y9289 Other specified places as the place of occurrence of the external cause: Secondary | ICD-10-CM | POA: Insufficient documentation

## 2012-11-21 DIAGNOSIS — Z8659 Personal history of other mental and behavioral disorders: Secondary | ICD-10-CM | POA: Insufficient documentation

## 2012-11-21 DIAGNOSIS — S8990XA Unspecified injury of unspecified lower leg, initial encounter: Secondary | ICD-10-CM | POA: Insufficient documentation

## 2012-11-21 HISTORY — DX: Anxiety disorder, unspecified: F41.9

## 2012-11-21 HISTORY — DX: Depression, unspecified: F32.A

## 2012-11-21 HISTORY — DX: Major depressive disorder, single episode, unspecified: F32.9

## 2012-11-21 HISTORY — DX: Essential (primary) hypertension: I10

## 2012-11-21 NOTE — ED Notes (Signed)
Pt fell tripping over a step tonight, she landed on her stomach and states that her stomach is slightly sore because she had a hysterectomy 2 months ago, her right ankle has obvious deformity.

## 2012-11-21 NOTE — ED Provider Notes (Signed)
History     CSN: [id number]  Arrival date & time 11/21/12  2258   First MD Initiated Contact with Patient 11/21/12 2317      Chief Complaint  Patient presents with  . Ankle Pain    (Consider location/radiation/quality/duration/timing/severity/associated sxs/prior treatment) HPI Comments: Pt presents to the ED for right ankle and right foot pain after a mechanical fall earlier today.  States she was trying to go down the stairs when she slipped and her right ankle rolled laterally causing her to land on her stomach.  Denies any head trauma or LOC.  Pt states stomach is sore from recent hysterectomy 2 months ago.  Denies any numbness or paresthesias of RLE.  Normal ROM toes, sensation intact.  No prior ankle or foot injury.  Has taken naprosyn for pain without significant relief.  The history is provided by the patient.    Past Medical History  Diagnosis Date  . Hypertension   . Depression   . Anxiety     Past Surgical History  Procedure Laterality Date  . Abdominal hysterectomy      History reviewed. No pertinent family history.  History  Substance Use Topics  . Smoking status: Not on file  . Smokeless tobacco: Not on file  . Alcohol Use: No    OB History   Grav Para Term Preterm Abortions TAB SAB Ect Mult Living                  Review of Systems  Musculoskeletal: Positive for joint swelling and arthralgias.  All other systems reviewed and are negative.    Allergies  Review of patient's allergies indicates no known allergies.  Home Medications   Current Outpatient Rx  Name  Route  Sig  Dispense  Refill  . hydrochlorothiazide (HYDRODIURIL) 25 MG tablet   Oral   Take 25 mg by mouth daily.         Marland Kitchen lisinopril (PRINIVIL,ZESTRIL) 40 MG tablet   Oral   Take 40 mg by mouth daily.           BP 159/92  Pulse 78  Temp(Src) 98.3 F (36.8 C) (Oral)  Resp 16  SpO2 100%  LMP 09/17/2010  Physical Exam  Nursing note and vitals  reviewed. Constitutional: She is oriented to person, place, and time. She appears well-developed and well-nourished.  HENT:  Head: Normocephalic and atraumatic.  Mouth/Throat: Oropharynx is clear and moist.  Eyes: Conjunctivae and EOM are normal. Pupils are equal, round, and reactive to light.  Neck: Normal range of motion.  Cardiovascular: Normal rate, regular rhythm and normal heart sounds.   Pulmonary/Chest: Effort normal and breath sounds normal.  Abdominal: Soft. Bowel sounds are normal. There is tenderness in the epigastric area.    Mild TTP, no bruising or ecchymosis; prior surgical incisions healed without signs of infection  Musculoskeletal:       Right ankle: She exhibits decreased range of motion (due to pain), swelling and ecchymosis. She exhibits no deformity, no laceration and normal pulse. Tenderness. Lateral malleolus and AITFL tenderness found. Achilles tendon normal.       Right foot: She exhibits decreased range of motion (due to pain), tenderness, bony tenderness, swelling and deformity. She exhibits no laceration.       Feet:  Bruising, swelling, and TTP of right ankle and foot; limited ROM due to pain, sensation intact, strong distal pulses and cap refill  Neurological: She is alert and oriented to person, place, and time.  Skin:  Skin is warm and dry.  Psychiatric: She has a normal mood and affect.    ED Course  Procedures (including critical care time)  Labs Reviewed - No data to display Dg Ankle Complete Right  11/22/2012  *RADIOLOGY REPORT*  Clinical Data: Twisted ankle and foot.  Lateral malleolar pain.  RIGHT ANKLE - COMPLETE 3+ VIEW  Comparison: Foot radiographs today.  Findings: The ankle mortise is congruent.  The alignment of the ankle is anatomic.  There is no fracture.  The soft tissues demonstrate mild swelling over the lateral malleolus and hind foot. Calcaneal spurs incidentally noted.  IMPRESSION: No acute osseous abnormality.   Original Report  Authenticated By: Andreas Newport, M.D.    Dg Foot Complete Right  11/22/2012  *RADIOLOGY REPORT*  Clinical Data: Ankle pain.  Foot pain.  Lateral malleolus pain.  RIGHT FOOT COMPLETE - 3+ VIEW  Comparison: Ankle radiographs today.  Findings: Anatomic alignment of the bones of the foot.  There is no fracture identified.  Calcaneal spurs incidentally noted.  No radiopaque foreign body.  Fifth metatarsal appears normal.  IMPRESSION: Negative.   Original Report Authenticated By: Andreas Newport, M.D.      1. Ankle pain, right   2. Foot pain, right       MDM  37 y.o. F with right ankle and foot pain following mechanical fall earlier today.  Neurovascularly intact.  Pain increased with weight bearing and ambulation.  X-ray negative for acute fx- likely sprain.  Air splint placed and crutches given in the ED.  Rx percocet.  [facility] ortho, Dr. Turner Daniels, if sx not improving.  RICE routine at home to help with swelling.  Discussed plan with pt- she agreed.  Return precautions advised.       Garlon Hatchet, PA-C 11/22/12 0023

## 2012-11-22 MED ORDER — OXYCODONE-ACETAMINOPHEN 5-325 MG PO TABS: 1.0000 | ORAL_TABLET | ORAL | Status: DC | PRN

## 2012-11-22 MED ORDER — OXYCODONE-ACETAMINOPHEN 5-325 MG PO TABS
1.0000 | ORAL_TABLET | Freq: Once | ORAL | Status: AC
  Administered 2012-11-22: 1 via ORAL
  Filled 2012-11-22: qty 1

## 2012-11-22 NOTE — ED Provider Notes (Signed)
Medical screening examination/treatment/procedure(s) were performed by non-physician practitioner and as supervising physician I was immediately available for consultation/collaboration.  Arantza Darrington, MD 11/22/12 1000 

## 2013-07-30 ENCOUNTER — Emergency Department (INDEPENDENT_AMBULATORY_CARE_PROVIDER_SITE_OTHER): Payer: BC Managed Care – PPO

## 2013-07-30 ENCOUNTER — Encounter (HOSPITAL_COMMUNITY): Payer: Self-pay | Admitting: Emergency Medicine

## 2013-07-30 ENCOUNTER — Emergency Department (INDEPENDENT_AMBULATORY_CARE_PROVIDER_SITE_OTHER)
Admission: EM | Admit: 2013-07-30 | Discharge: 2013-07-30 | Disposition: A | Discharge status: Home / Self Care | Payer: BC Managed Care – PPO | Source: Home / Self Care | Attending: Family Medicine | Admitting: Family Medicine

## 2013-07-30 DIAGNOSIS — R05 Cough: Secondary | ICD-10-CM

## 2013-07-30 DIAGNOSIS — R059 Cough, unspecified: Secondary | ICD-10-CM

## 2013-07-30 NOTE — Discharge Instructions (Signed)
Your chest xray was normal. I would suggest that you follow up with your doctor to discuss switching to a new blood pressure medication and discontinuing your lisinopril as your chronic cough may be caused by this medication. Please do not stop your medication without discussing with your doctor first.

## 2013-07-30 NOTE — ED Notes (Signed)
Pt  Has  Symptoms  Of  Non  Productive  Cough    With  Back  Pain  X   2-3   Months          Pt  Sitting  Upright on  Exam table speaking in complete  sentances  And is  In no  Severe  Distress

## 2013-07-30 NOTE — ED Provider Notes (Signed)
CSN: [id number]     Arrival date & time 07/30/13  1806 History   First MD Initiated Contact with Patient 07/30/13 1920     Chief Complaint  Patient presents with  . Cough   (Consider location/radiation/quality/duration/timing/severity/associated sxs/prior Treatment) Patient is a 38 y.o. female presenting with cough. The history is provided by the patient.  Cough Cough characteristics:  Dry Severity:  Moderate Onset quality:  Gradual Duration: 4-5 months. Progression:  Unchanged Smoker: no   Associated symptoms: no chest pain, no chills, no fever, no myalgias, no rhinorrhea, no shortness of breath, no sinus congestion, no sore throat and no wheezing     Past Medical History  Diagnosis Date  . Hypertension   . Depression   . Anxiety    Past Surgical History  Procedure Laterality Date  . Abdominal hysterectomy     History reviewed. No pertinent family history. History  Substance Use Topics  . Smoking status: Not on file  . Smokeless tobacco: Not on file  . Alcohol Use: No   OB History   Grav Para Term Preterm Abortions TAB SAB Ect Mult Living                 Review of Systems  Constitutional: Negative for fever and chills.  HENT: Negative for rhinorrhea and sore throat.   Respiratory: Positive for cough. Negative for shortness of breath and wheezing.   Cardiovascular: Negative for chest pain, palpitations and leg swelling.  Musculoskeletal: Negative for myalgias.  All other systems reviewed and are negative.    Allergies  Review of patient's allergies indicates no known allergies.  Home Medications   Current Outpatient Rx  Name  Route  Sig  Dispense  Refill  . hydrochlorothiazide (HYDRODIURIL) 25 MG tablet   Oral   Take 25 mg by mouth every morning.          Marland Kitchen lisinopril (PRINIVIL,ZESTRIL) 40 MG tablet   Oral   Take 40 mg by mouth every morning.          Marland Kitchen LORazepam (ATIVAN PO)   Oral   Take by mouth.         . oxyCODONE-acetaminophen  (PERCOCET/ROXICET) 5-325 MG per tablet   Oral   Take 1 tablet by mouth every 4 (four) hours as needed for pain.   15 tablet   0   . PRESCRIPTION MEDICATION               . PRESCRIPTION MEDICATION                BP 134/72  Pulse 81  Temp(Src) 98.1 F (36.7 C) (Oral)  Resp 16  SpO2 100%  LMP 09/17/2010 Physical Exam  Nursing note and vitals reviewed. Constitutional: She is oriented to person, place, and time. She appears well-developed and well-nourished. No distress.  HENT:  Head: Normocephalic and atraumatic.  Right Ear: Hearing, tympanic membrane, external ear and ear canal normal.  Left Ear: Hearing, tympanic membrane, external ear and ear canal normal.  Nose: Nose normal.  Mouth/Throat: Uvula is midline, oropharynx is clear and moist and mucous membranes are normal.  Eyes: Conjunctivae are normal.  Neck: Normal range of motion. Neck supple. No thyromegaly present.  Cardiovascular: Normal rate, regular rhythm and normal heart sounds.   Pulmonary/Chest: Effort normal and breath sounds normal. No respiratory distress. She has no wheezes. She has no rales. She exhibits no tenderness.  Abdominal: Soft. Bowel sounds are normal. She exhibits no distension. There is no tenderness.  Musculoskeletal: Normal range of motion. She exhibits no edema and no tenderness.  Lymphadenopathy:    She has no cervical adenopathy.  Neurological: She is alert and oriented to person, place, and time.  Skin: Skin is warm and dry.  Psychiatric: She has a normal mood and affect.    ED Course  Procedures (including critical care time) Labs Review Labs Reviewed - No data to display Imaging Review Dg Chest 2 View  07/30/2013   CLINICAL DATA:  Cough. Chest discomfort. Upper back pain. Hypertension.  EXAM: CHEST  2 VIEW  COMPARISON:  CT ABD-PELV W/ CM dated 06/14/2012; DG CHEST 2 VIEW dated 09/19/2008  FINDINGS: Pectus carinatum noted. The lungs appear clear. Cardiac and mediastinal contours  normal. No pleural effusion identified.  IMPRESSION: 1.  No significant abnormality identified.   Electronically Signed   By: Sherryl Barters M.D.   On: 07/30/2013 19:50    EKG Interpretation    Date/Time:    Ventricular Rate:    PR Interval:    QRS Duration:   QT Interval:    QTC Calculation:   R Axis:     Text Interpretation:              MDM   38 y/o female on ACEI with 4-5 months of dry cough without associated fever, weight loss, night sweats or hemoptysis and a normal chest xray. Advised she have a conversation with her PCP ([facility]) about discontinuing ACEI medication.    [place], [place] 07/30/13 2018

## 2013-08-04 NOTE — ED Provider Notes (Signed)
Medical screening examination/treatment/procedure(s) were performed by resident physician or non-physician practitioner and as supervising physician I was immediately available for consultation/collaboration.   Jassmine Vandruff DOUGLAS MD.   Romanita Fager D Mountrail Grosser, MD 08/04/13 1420 

## 2013-11-20 NOTE — H&P (Signed)
  [facility] [address] [place], [place] [address] [phone] A Division of [facility]  Ninetta Lights, M.D.   Robert A. Noemi Chapel, M.D.   Faythe Casa, M.D.   Johnny Bridge, M.D.   Almedia Balls, M.D Ernesta Amble. Percell Miller, M.D.  Joseph Pierini, M.D.  Lanier Prude, M.D.    Verner Chol, M.D. Mary L. Fenton Malling, PA-C  Kirstin A. Shepperson, PA-C  Josh [place], PA-C [place], [place]   RE:  [name], [name] PROGRESS NOTE: 10-31-13 Reason for visit:  Referral from Dr. Lynann Bologna for evaluation of multiple right upper extremity complaints. History of present illness: This is a 38 year old who has had 3 months of pain in her right elbow, carpal tunnel syndrome confirmed on EMG and dorsal ganglion on her wrist.  She has tried a wrist splint and anti-inflammatories without relief, the wrist splint caused more problems. Her carpal tunnel syndrome and ganglion are her primary complaints.   Please see associated documentation for this clinic visit for further past medical, family, surgical and social history, review of systems, and exam findings as this was reviewed by me.  EXAMINATION: Well appearing female in no apparent distress. Right upper extremity is neurovascularly intact. She has a positive Durkin's and  positive Phalen's with positive Tinel's at the wrist. She has a firm dorsal mass that is mobile and non-tender it is tender to deep palpation, it is slightly more firm than most ganglion's but does have some compressibility to it. She also has tenderness at the lateral epicondyle. She has pain with resisted wrist extension.  X-RAYS: None today.   Review of EMG confirms median nerve compression at the wrist.  ASSESSMENT: Right lateral epicondylitis. Right dorsal wrist mass. Right carpal tunnel syndrome.  PLAN: 1. With regards to the wrist mass I am relatively confident this is a ganglion cyst but I  would like to confirm that with an MRI given the firmness of the mass itself.  2. Once we have done that I think it is appropriate to go forward with excision of the dorsal wrist ganglion and endoscopic carpal tunnel release. 3. With regards to the lateral epicondylitis I would like to talk to her one more time prior to surgery to see how this is doing if we do surgery on the arm the question is should we debride her lateral epicondyle while there. Symptoms are not quite bad to warrant it and I think we could get her better with physical therapy afterwards but we will talk about it one more time.  Ernesta Amble.  Percell Miller, M.D. Electronically verified by Ernesta Amble. Percell Miller, M.D. TDM:kah D 10-31-13 T 10-31-13

## 2013-11-21 DIAGNOSIS — M67439 Ganglion, unspecified wrist: Secondary | ICD-10-CM

## 2013-11-21 DIAGNOSIS — M7711 Lateral epicondylitis, right elbow: Secondary | ICD-10-CM

## 2013-11-21 DIAGNOSIS — G5601 Carpal tunnel syndrome, right upper limb: Secondary | ICD-10-CM

## 2013-11-21 HISTORY — DX: Carpal tunnel syndrome, right upper limb: G56.01

## 2013-11-21 HISTORY — DX: Ganglion, unspecified wrist: M67.439

## 2013-11-21 HISTORY — DX: Lateral epicondylitis, right elbow: M77.11

## 2013-12-05 ENCOUNTER — Encounter (HOSPITAL_BASED_OUTPATIENT_CLINIC_OR_DEPARTMENT_OTHER): Payer: Self-pay | Admitting: *Deleted

## 2013-12-09 ENCOUNTER — Other Ambulatory Visit: Payer: Self-pay

## 2013-12-09 ENCOUNTER — Encounter (HOSPITAL_BASED_OUTPATIENT_CLINIC_OR_DEPARTMENT_OTHER): Payer: Self-pay | Admitting: *Deleted

## 2013-12-09 ENCOUNTER — Encounter (HOSPITAL_BASED_OUTPATIENT_CLINIC_OR_DEPARTMENT_OTHER)
Admission: RE | Admit: 2013-12-09 | Discharge: 2013-12-09 | Disposition: A | Discharge status: Home / Self Care | Payer: BC Managed Care – PPO | Source: Ambulatory Visit | Attending: Orthopedic Surgery | Admitting: Orthopedic Surgery

## 2013-12-09 LAB — BASIC METABOLIC PANEL
BUN: 11 mg/dL (ref 6–23)
CALCIUM: 9.4 mg/dL (ref 8.4–10.5)
CO2: 23 mEq/L (ref 19–32)
Chloride: 102 mEq/L (ref 96–112)
Creatinine, Ser: 0.5 mg/dL (ref 0.50–1.10)
GFR calc non Af Amer: >90 mL/min (ref >90)
GLUCOSE: 129 mg/dL — AB (ref 70–99)
POTASSIUM: 3.7 meq/L (ref 3.7–5.3)
Sodium: 140 mEq/L (ref 137–147)

## 2013-12-09 NOTE — Pre-Procedure Instructions (Addendum)
Discussed history of sleep apnea and BMI with Dr. Al Corpus; pt. will need to stay overnight.  Message left on pt's cell - [phone] - to come prepared to spend the night.  Claiborne Billings at Dr. Debroah Loop office notified that pt. will need to stay overnight.

## 2013-12-09 NOTE — Pre-Procedure Instructions (Signed)
Spanish interpreter requested from [facility] for [facility]) for pt's mother for post-op 1300 - 1500 12/12/2013; spoke with Colletta Maryland.

## 2013-12-09 NOTE — Pre-Procedure Instructions (Signed)
Angie will be interpreter for pt's mother, per Colletta Maryland at [place] for [facility]); call [phone] if surgery time changes; interpreter will arrive at 1300.

## 2013-12-09 NOTE — Pre-Procedure Instructions (Signed)
To come for BMET and EKG 

## 2013-12-12 ENCOUNTER — Ambulatory Visit (HOSPITAL_BASED_OUTPATIENT_CLINIC_OR_DEPARTMENT_OTHER)
Admission: RE | Admit: 2013-12-12 | Discharge: 2013-12-13 | Disposition: A | Discharge status: Home / Self Care | Payer: BC Managed Care – PPO | Source: Ambulatory Visit | Attending: Orthopedic Surgery | Admitting: Orthopedic Surgery

## 2013-12-12 ENCOUNTER — Ambulatory Visit (HOSPITAL_BASED_OUTPATIENT_CLINIC_OR_DEPARTMENT_OTHER): Payer: BC Managed Care – PPO | Admitting: Anesthesiology

## 2013-12-12 ENCOUNTER — Encounter (HOSPITAL_BASED_OUTPATIENT_CLINIC_OR_DEPARTMENT_OTHER)
Admission: RE | Disposition: A | Discharge status: Home / Self Care | Payer: Self-pay | Source: Ambulatory Visit | Attending: Orthopedic Surgery

## 2013-12-12 ENCOUNTER — Encounter (HOSPITAL_BASED_OUTPATIENT_CLINIC_OR_DEPARTMENT_OTHER): Payer: Self-pay | Admitting: Certified Registered"

## 2013-12-12 ENCOUNTER — Encounter (HOSPITAL_BASED_OUTPATIENT_CLINIC_OR_DEPARTMENT_OTHER): Payer: BC Managed Care – PPO | Admitting: Anesthesiology

## 2013-12-12 DIAGNOSIS — M6740 Ganglion, unspecified site: Secondary | ICD-10-CM | POA: Insufficient documentation

## 2013-12-12 DIAGNOSIS — I10 Essential (primary) hypertension: Secondary | ICD-10-CM | POA: Insufficient documentation

## 2013-12-12 DIAGNOSIS — G5600 Carpal tunnel syndrome, unspecified upper limb: Secondary | ICD-10-CM | POA: Diagnosis present

## 2013-12-12 DIAGNOSIS — E669 Obesity, unspecified: Secondary | ICD-10-CM | POA: Insufficient documentation

## 2013-12-12 DIAGNOSIS — M7710 Lateral epicondylitis, unspecified elbow: Secondary | ICD-10-CM | POA: Insufficient documentation

## 2013-12-12 HISTORY — DX: Migraine, unspecified, not intractable, without status migrainosus: G43.909

## 2013-12-12 HISTORY — DX: Carpal tunnel syndrome, right upper limb: G56.01

## 2013-12-12 HISTORY — PX: LATERAL EPICONDYLE RELEASE: SHX1958

## 2013-12-12 HISTORY — PX: CARPAL TUNNEL RELEASE: SHX101

## 2013-12-12 HISTORY — DX: Ganglion, unspecified wrist: M67.439

## 2013-12-12 HISTORY — DX: Lateral epicondylitis, right elbow: M77.11

## 2013-12-12 LAB — POCT HEMOGLOBIN-HEMACUE: HEMOGLOBIN: 14.3 g/dL (ref 12.0–15.0)

## 2013-12-12 SURGERY — RELEASE, CARPAL TUNNEL, ENDOSCOPIC
Anesthesia: General | Site: Wrist | Laterality: Right

## 2013-12-12 MED ORDER — ACETAMINOPHEN 500 MG PO TABS
ORAL_TABLET | ORAL | Status: AC
  Filled 2013-12-12: qty 2

## 2013-12-12 MED ORDER — CEFAZOLIN SODIUM-DEXTROSE 2-3 GM-% IV SOLR
2.0000 g | INTRAVENOUS | Status: AC
  Administered 2013-12-12: 2 g via INTRAVENOUS

## 2013-12-12 MED ORDER — FENTANYL CITRATE 0.05 MG/ML IJ SOLN
INTRAMUSCULAR | Status: AC
  Filled 2013-12-12: qty 6

## 2013-12-12 MED ORDER — CEFAZOLIN SODIUM-DEXTROSE 2-3 GM-% IV SOLR
INTRAVENOUS | Status: AC
  Filled 2013-12-12: qty 50

## 2013-12-12 MED ORDER — PROPOFOL 10 MG/ML IV BOLUS
INTRAVENOUS | Status: DC | PRN
  Administered 2013-12-12: 200 mg via INTRAVENOUS
  Administered 2013-12-12: 30 mg via INTRAVENOUS
  Administered 2013-12-12: 50 mg via INTRAVENOUS

## 2013-12-12 MED ORDER — FENTANYL CITRATE 0.05 MG/ML IJ SOLN: 50.0000 ug | INTRAMUSCULAR | Status: DC | PRN

## 2013-12-12 MED ORDER — PROMETHAZINE HCL 25 MG/ML IJ SOLN: 25.0000 mg | Freq: Four times a day (QID) | INTRAMUSCULAR | Status: DC | PRN

## 2013-12-12 MED ORDER — DOCUSATE SODIUM 100 MG PO CAPS
100.0000 mg | ORAL_CAPSULE | Freq: Two times a day (BID) | ORAL | Status: DC
  Administered 2013-12-12: 100 mg via ORAL
  Filled 2013-12-12: qty 1

## 2013-12-12 MED ORDER — BUPIVACAINE HCL (PF) 0.5 % IJ SOLN
INTRAMUSCULAR | Status: DC | PRN
  Administered 2013-12-12: 10 mL

## 2013-12-12 MED ORDER — LACTATED RINGERS IV SOLN
INTRAVENOUS | Status: DC
  Administered 2013-12-12 (×2): via INTRAVENOUS

## 2013-12-12 MED ORDER — HYDROMORPHONE HCL PF 1 MG/ML IJ SOLN
INTRAMUSCULAR | Status: AC
  Filled 2013-12-12: qty 1

## 2013-12-12 MED ORDER — OXYCODONE-ACETAMINOPHEN 5-325 MG PO TABS
1.0000 | ORAL_TABLET | ORAL | Status: DC | PRN
  Administered 2013-12-12: 2 via ORAL
  Filled 2013-12-12: qty 2

## 2013-12-12 MED ORDER — METOCLOPRAMIDE HCL 5 MG/ML IJ SOLN: 5.0000 mg | Freq: Three times a day (TID) | INTRAMUSCULAR | Status: DC | PRN

## 2013-12-12 MED ORDER — ONDANSETRON HCL 4 MG/2ML IJ SOLN: 4.0000 mg | Freq: Once | INTRAMUSCULAR | Status: AC | PRN

## 2013-12-12 MED ORDER — ACETAMINOPHEN 500 MG PO TABS
1000.0000 mg | ORAL_TABLET | Freq: Once | ORAL | Status: AC
  Administered 2013-12-12: 1000 mg via ORAL

## 2013-12-12 MED ORDER — METOCLOPRAMIDE HCL 5 MG PO TABS: 5.0000 mg | ORAL_TABLET | Freq: Three times a day (TID) | ORAL | Status: DC | PRN

## 2013-12-12 MED ORDER — ONDANSETRON HCL 4 MG PO TABS: 4.0000 mg | ORAL_TABLET | Freq: Four times a day (QID) | ORAL | Status: DC | PRN

## 2013-12-12 MED ORDER — FENTANYL CITRATE 0.05 MG/ML IJ SOLN
INTRAMUSCULAR | Status: DC | PRN
  Administered 2013-12-12: 50 ug via INTRAVENOUS
  Administered 2013-12-12: 100 ug via INTRAVENOUS
  Administered 2013-12-12: 50 ug via INTRAVENOUS

## 2013-12-12 MED ORDER — HYDROMORPHONE HCL PF 1 MG/ML IJ SOLN
0.2500 mg | INTRAMUSCULAR | Status: DC | PRN
  Administered 2013-12-12 (×4): 0.5 mg via INTRAVENOUS

## 2013-12-12 MED ORDER — CEFAZOLIN SODIUM-DEXTROSE 2-3 GM-% IV SOLR
INTRAVENOUS | Status: AC
  Filled 2013-12-12: qty 100

## 2013-12-12 MED ORDER — DOCUSATE SODIUM 100 MG PO CAPS: 100.0000 mg | ORAL_CAPSULE | Freq: Two times a day (BID) | ORAL | Status: DC

## 2013-12-12 MED ORDER — DEXAMETHASONE SODIUM PHOSPHATE 10 MG/ML IJ SOLN
INTRAMUSCULAR | Status: DC | PRN
  Administered 2013-12-12: 10 mg via INTRAVENOUS

## 2013-12-12 MED ORDER — DIPHENHYDRAMINE HCL 50 MG PO CAPS
50.0000 mg | ORAL_CAPSULE | ORAL | Status: AC
  Administered 2013-12-12: 50 mg via ORAL
  Filled 2013-12-12: qty 2

## 2013-12-12 MED ORDER — DEXTROSE-NACL 5-0.45 % IV SOLN: 100.0000 mL/h | INTRAVENOUS | Status: DC

## 2013-12-12 MED ORDER — DIPHENHYDRAMINE HCL 25 MG PO CAPS
25.0000 mg | ORAL_CAPSULE | Freq: Four times a day (QID) | ORAL | Status: DC | PRN
  Administered 2013-12-12 – 2013-12-13 (×2): 50 mg via ORAL
  Filled 2013-12-12 (×2): qty 2

## 2013-12-12 MED ORDER — OXYCODONE HCL 5 MG PO TABS: 10.0000 mg | ORAL_TABLET | ORAL | Status: DC | PRN

## 2013-12-12 MED ORDER — TRAMADOL HCL 50 MG PO TABS: 50.0000 mg | ORAL_TABLET | ORAL | Status: DC | PRN

## 2013-12-12 MED ORDER — MIDAZOLAM HCL 5 MG/5ML IJ SOLN
INTRAMUSCULAR | Status: DC | PRN
  Administered 2013-12-12: 2 mg via INTRAVENOUS

## 2013-12-12 MED ORDER — TRAMADOL HCL 50 MG PO TABS: 100.0000 mg | ORAL_TABLET | Freq: Four times a day (QID) | ORAL | Status: DC | PRN

## 2013-12-12 MED ORDER — SODIUM CHLORIDE 0.9 % IV SOLN
INTRAVENOUS | Status: DC
  Administered 2013-12-12: 21:00:00 via INTRAVENOUS
  Administered 2013-12-12: 10 mL/h via INTRAVENOUS

## 2013-12-12 MED ORDER — CEFAZOLIN SODIUM-DEXTROSE 2-3 GM-% IV SOLR
2.0000 g | Freq: Four times a day (QID) | INTRAVENOUS | Status: AC
  Administered 2013-12-12 – 2013-12-13 (×3): 2 g via INTRAVENOUS

## 2013-12-12 MED ORDER — LIDOCAINE HCL (CARDIAC) 20 MG/ML IV SOLN
INTRAVENOUS | Status: DC | PRN
  Administered 2013-12-12: 50 mg via INTRAVENOUS

## 2013-12-12 MED ORDER — MIDAZOLAM HCL 2 MG/ML PO SYRP: 12.0000 mg | ORAL_SOLUTION | Freq: Once | ORAL | Status: AC | PRN

## 2013-12-12 MED ORDER — PROPOFOL 10 MG/ML IV BOLUS
INTRAVENOUS | Status: AC
  Filled 2013-12-12: qty 20

## 2013-12-12 MED ORDER — MIDAZOLAM HCL 2 MG/2ML IJ SOLN: 1.0000 mg | INTRAMUSCULAR | Status: DC | PRN

## 2013-12-12 MED ORDER — ONDANSETRON HCL 4 MG PO TABS: 4.0000 mg | ORAL_TABLET | Freq: Three times a day (TID) | ORAL | Status: DC | PRN

## 2013-12-12 MED ORDER — MIDAZOLAM HCL 2 MG/2ML IJ SOLN
INTRAMUSCULAR | Status: AC
  Filled 2013-12-12: qty 2

## 2013-12-12 MED ORDER — ONDANSETRON HCL 4 MG/2ML IJ SOLN
4.0000 mg | Freq: Four times a day (QID) | INTRAMUSCULAR | Status: DC | PRN
  Administered 2013-12-12: 4 mg via INTRAVENOUS
  Filled 2013-12-12: qty 2

## 2013-12-12 MED ORDER — TRAMADOL HCL 50 MG PO TABS
50.0000 mg | ORAL_TABLET | Freq: Four times a day (QID) | ORAL | Status: DC | PRN
  Administered 2013-12-12 – 2013-12-13 (×3): 100 mg via ORAL
  Filled 2013-12-12 (×3): qty 2

## 2013-12-12 MED ORDER — ONDANSETRON HCL 4 MG/2ML IJ SOLN
INTRAMUSCULAR | Status: DC | PRN
  Administered 2013-12-12: 4 mg via INTRAVENOUS

## 2013-12-12 SURGICAL SUPPLY — 47 items
BANDAGE ELASTIC 3 VELCRO ST LF (GAUZE/BANDAGES/DRESSINGS) ×4 IMPLANT
BLADE 15 SAFETY STRL DISP (BLADE) ×4 IMPLANT
BLADE SLIMLINE EXTR (BLADE) ×4 IMPLANT
BNDG ESMARK 4X9 LF (GAUZE/BANDAGES/DRESSINGS) IMPLANT
CHLORAPREP W/TINT 26ML (MISCELLANEOUS) ×4 IMPLANT
CORDS BIPOLAR (ELECTRODE) IMPLANT
COVER TABLE BACK 60X90 (DRAPES) ×4 IMPLANT
CUFF TOURNIQUET SINGLE 18IN (TOURNIQUET CUFF) ×4 IMPLANT
DRAPE EXTREMITY T 121X128X90 (DRAPE) ×4 IMPLANT
DRAPE SURG 17X23 STRL (DRAPES) ×4 IMPLANT
DRAPE U 20/CS (DRAPES) ×4 IMPLANT
DRSG EMULSION OIL 3X3 NADH (GAUZE/BANDAGES/DRESSINGS) IMPLANT
DRSG TEGADERM 2-3/8X2-3/4 SM (GAUZE/BANDAGES/DRESSINGS) ×12 IMPLANT
GAUZE SPONGE 4X4 12PLY STRL (GAUZE/BANDAGES/DRESSINGS) ×4 IMPLANT
GLOVE BIO SURGEON STRL SZ 6.5 (GLOVE) ×3 IMPLANT
GLOVE BIO SURGEON STRL SZ7.5 (GLOVE) ×4 IMPLANT
GLOVE BIO SURGEON STRL SZ8 (GLOVE) ×4 IMPLANT
GLOVE BIO SURGEONS STRL SZ 6.5 (GLOVE) ×1
GLOVE BIOGEL PI IND STRL 7.0 (GLOVE) ×2 IMPLANT
GLOVE BIOGEL PI IND STRL 8 (GLOVE) ×2 IMPLANT
GLOVE BIOGEL PI IND STRL 8.5 (GLOVE) ×2 IMPLANT
GLOVE BIOGEL PI INDICATOR 7.0 (GLOVE) ×2
GLOVE BIOGEL PI INDICATOR 8 (GLOVE) ×2
GLOVE BIOGEL PI INDICATOR 8.5 (GLOVE) ×2
GOWN STRL REUS W/ TWL LRG LVL3 (GOWN DISPOSABLE) ×6 IMPLANT
GOWN STRL REUS W/ TWL XL LVL3 (GOWN DISPOSABLE) ×2 IMPLANT
GOWN STRL REUS W/TWL LRG LVL3 (GOWN DISPOSABLE) ×6
GOWN STRL REUS W/TWL XL LVL3 (GOWN DISPOSABLE) ×2
NEEDLE HYPO 25X1 1.5 SAFETY (NEEDLE) ×4 IMPLANT
NS IRRIG 1000ML POUR BTL (IV SOLUTION) ×4 IMPLANT
PACK BASIN DAY SURGERY FS (CUSTOM PROCEDURE TRAY) ×4 IMPLANT
PAD CAST 3X4 CTTN HI CHSV (CAST SUPPLIES) ×4 IMPLANT
PADDING CAST ABS 3INX4YD NS (CAST SUPPLIES) ×2
PADDING CAST ABS 4INX4YD NS (CAST SUPPLIES)
PADDING CAST ABS COTTON 3X4 (CAST SUPPLIES) ×2 IMPLANT
PADDING CAST ABS COTTON 4X4 ST (CAST SUPPLIES) IMPLANT
PADDING CAST COTTON 3X4 STRL (CAST SUPPLIES) ×4
SOLUTION ANTI FOG 6CC (MISCELLANEOUS) IMPLANT
SPLINT FAST PLASTER 5X30 (CAST SUPPLIES) ×20
SPLINT PLASTER CAST FAST 5X30 (CAST SUPPLIES) ×20 IMPLANT
SPLINT PLASTER CAST XFAST 3X15 (CAST SUPPLIES) IMPLANT
SPLINT PLASTER XTRA FASTSET 3X (CAST SUPPLIES)
SUT ETHILON 3 0 PS 1 (SUTURE) ×8 IMPLANT
SYR CONTROL 10ML LL (SYRINGE) ×4 IMPLANT
TOWEL OR 17X24 6PK STRL BLUE (TOWEL DISPOSABLE) ×4 IMPLANT
TOWEL OR NON WOVEN STRL DISP B (DISPOSABLE) ×4 IMPLANT
UNDERPAD 30X30 INCONTINENT (UNDERPADS AND DIAPERS) ×4 IMPLANT

## 2013-12-12 NOTE — Anesthesia Preprocedure Evaluation (Signed)
Anesthesia Evaluation  Patient identified by MRN, date of birth, ID band Patient awake    Reviewed: Allergy & Precautions, H&P , NPO status , Patient's Chart, lab work & pertinent test results  Airway Mallampati: II TM Distance: >3 FB Neck ROM: Full    Dental  (+) Teeth Intact, Dental Advisory Given   Pulmonary  breath sounds clear to auscultation        Cardiovascular hypertension, Rhythm:Regular Rate:Normal     Neuro/Psych    GI/Hepatic   Endo/Other    Renal/GU      Musculoskeletal   Abdominal (+) + obese,   Peds  Hematology   Anesthesia Other Findings   Reproductive/Obstetrics                           Anesthesia Physical Anesthesia Plan  ASA: III  Anesthesia Plan: General   Post-op Pain Management:    Induction: Intravenous  Airway Management Planned: LMA  Additional Equipment:   Intra-op Plan:   Post-operative Plan:   Informed Consent: I have reviewed the patients History and Physical, chart, labs and discussed the procedure including the risks, benefits and alternatives for the proposed anesthesia with the patient or authorized representative who has indicated his/her understanding and acceptance.   Dental advisory given  Plan Discussed with: CRNA and Anesthesiologist  Anesthesia Plan Comments: (Obesity Hypertension Probable Sleep apnea  Plan GA with LMA)        Anesthesia Quick Evaluation

## 2013-12-12 NOTE — Interval H&P Note (Signed)
History and Physical Interval Note:  12/12/2013 8:23 AM  [name]  has presented today for surgery, with the diagnosis of Carpal tunnel syndrome Ganglion of joint    The various methods of treatment have been discussed with the patient and family. After consideration of risks, benefits and other options for treatment, the patient has consented to  Procedure(s): ENDOSCOPY RIGHT WRIST CARPAL TUNNEL RELEASE EXCISION GANGLION WRIST PRIMARY  (Right) RIGHT TENNIS ELBOW RELEASE WITH BONE REPAIR (Right) as a surgical intervention .  The patient's history has been reviewed, patient examined, no change in status, stable for surgery.  I have reviewed the patient's chart and labs.  Questions were answered to the patient's satisfaction.     Renette Butters

## 2013-12-12 NOTE — Discharge Instructions (Signed)
No use of right arm  Keep your splint intact and dry  Call your surgeon if you experience:   1.  Fever over 101.0. 2.  Inability to urinate. 3.  Nausea and/or vomiting. 4.  Extreme swelling or bruising at the surgical site. 5.  Continued bleeding from the incision. 6.  Increased pain, redness or drainage from the incision. 7.  Problems related to your pain medication.

## 2013-12-12 NOTE — Anesthesia Postprocedure Evaluation (Signed)
  Anesthesia Post-op Note  Patient: [name]  Procedure(s) Performed: Procedure(s): ENDOSCOPY RIGHT WRIST CARPAL TUNNEL RELEASE EXCISION GANGLION WRIST PRIMARY  (Right) RIGHT TENNIS ELBOW RELEASE WITH BONE REPAIR (Right)  Patient Location: PACU  Anesthesia Type:General  Level of Consciousness: awake, alert  and oriented  Airway and Oxygen Therapy: Patient Spontanous Breathing and Patient connected to nasal cannula oxygen  Post-op Pain: mild  Post-op Assessment: Post-op Vital signs reviewed, Patient's Cardiovascular Status Stable, Respiratory Function Stable, Patent Airway and Pain level controlled  Post-op Vital Signs: stable  Last Vitals:  Filed Vitals:   12/12/13 1430  BP: 113/68  Pulse: 77  Temp:   Resp: 18    Complications: No apparent anesthesia complications

## 2013-12-12 NOTE — Op Note (Signed)
12/12/2013  1:26 PM  PATIENT:  [name]    PRE-OPERATIVE DIAGNOSIS:  Carpal tunnel syndrome Ganglion of joint    POST-OPERATIVE DIAGNOSIS:  Same  PROCEDURE:  ENDOSCOPY RIGHT WRIST CARPAL TUNNEL RELEASE EXCISION GANGLION WRIST PRIMARY , RIGHT TENNIS ELBOW RELEASE WITH BONE REPAIR  SURGEON:  Renette Butters, MD  PHYSICIAN ASSISTANT: Joya Gaskins OPA  ANESTHESIA:   General  PREOPERATIVE INDICATIONS:  [name] is a  38 y.o. female with a diagnosis of Carpal tunnel syndrome Ganglion of joint   who failed conservative measures and elected for surgical management.    She suffered from carpal tunnel syndrome was confirmed with EMG and she failed nonoperative measures she'll sided dorsal wrist ganglion failed nonoperative measures as well. She's been suffering from 3 months of lateral epicondylitis we have attempted a home exercise program injection as well as immobilization of the wrist she is not improve from this and its exit were sent. Had a lengthy discussion with her that I would recommend continued physical therapy for this in formal physical therapy however given the fact where operating on her right upper extremity for her carpal tunnel and her ganglion she was strongly like to undergo debridement of her lateral epicondylitis as well I again advised her that she would need either therapy postop she still electrophoresis.  The risks benefits and alternatives were discussed with the patient preoperatively including but not limited to the risks of infection, bleeding, nerve injury, incomplete relief of symptoms, pillar pain, cardiopulmonary complications, the need for revision surgery, among others, and the patient was willing to proceed.  OPERATIVE FINDINGS: diseased tissue at ECRB  OPERATIVE PROCEDURE: Patient was identified in the preoperative holding area and site was marked by me. female was transported to the operating theater and placed on the table in the supine  position taking care to pad all bony prominences. After appropriate time out general anesthesia was induced and female received ancef for preoperative antibiotics. The extremity was prepped and draped in normal sterile fashion.  I made a 1 severe incision just proximal to the dominant volar wrist crease in line with the radial aspect of the small finger. Spread down to the fascia and this was incised in line with the incision. Metzenbaum scissors were then prepped passed above and below the fascia proximally to clear the nerve away from the fascia clear soft tissue plane superficial to it as well. This was then incised.  I then turned attention distally where the fascia edge was as was visible within the wound. I sequentially dilated the carpal tunnel sweeping soft tissue off of the undersurface of the ligament which was palpable throughout each past. I then inserted the endoscope and under direct visualization was able to see the undersurface the carpal tunnel throughout the entire insertion. Wasn't visualized the distal end of the ligament I put the blade retracted the instrument incising the transverse carpal ligament. I then reinserted the endoscope and directly visualized each cut and as well as intact median nerve without harm.   Wound was then irrigated and closed with a superficial suture.  I then turned my attention to her dorsal wrist ganglion. I made a 3 cm incision over top of the palpable ganglion roughly at the base of her first metacarpal. I dissected down carefully avoiding superficial cutaneous nerves and identified the ganglion I dissected around the ganglion using spreading dissection I traveled between her extensor tendons and originated from her second Union joint. I identified and excised  the stalk removed in whole and opened up the joint capsule to prevent recurrence. I then thoroughly irrigated this wound and closed it with a nylon stitch.    I then turned my attention to her  lateral condyle palpated her lateral epicondyle made incision just distal this of 3 cm. I spreading dissection to get more volarly down through the fat to the fascia over top of her extensor wad. I incised this longitudinally with the muscle and spread her ECRL identifying ECRB tendon at its origin. There is a significant amount of disorganized disease appearing tissue I debrided this with a Ronjair. There was not a ruptured ECRB side not perform a repair. I did closed the tendon side-to-side after debriding the disease tissue. I then also closed ECRL tendon side-to-side. It was stable. I then thoroughly irrigated this wound and closed the skin in layers.  Sterile dressings were applied and a long-arm splint was applied to the arm.   She tolerated this well, with no complications.  POSTOPERATIVE PLAN: Keep the splint clean and dry. VTE prophylaxis will consist of ambulation and foot pump exercises

## 2013-12-12 NOTE — Transfer of Care (Signed)
Immediate Anesthesia Transfer of Care Note  Patient: [name]  Procedure(s) Performed: Procedure(s): ENDOSCOPY RIGHT WRIST CARPAL TUNNEL RELEASE EXCISION GANGLION WRIST PRIMARY  (Right) RIGHT TENNIS ELBOW RELEASE WITH BONE REPAIR (Right)  Patient Location: PACU  Anesthesia Type:General  Level of Consciousness: awake, sedated and responds to stimulation  Airway & Oxygen Therapy: Patient Spontanous Breathing and Patient connected to face mask oxygen  Post-op Assessment: Report given to PACU RN, Post -op Vital signs reviewed and stable and Patient moving all extremities  Post vital signs: Reviewed and stable  Complications: No apparent anesthesia complications

## 2013-12-12 NOTE — Anesthesia Procedure Notes (Signed)
Procedure Name: LMA Insertion Date/Time: 12/12/2013 12:24 PM Performed by: Baxter Flattery Pre-anesthesia Checklist: Patient identified, Emergency Drugs available, Suction available and Patient being monitored Patient Re-evaluated:Patient Re-evaluated prior to inductionOxygen Delivery Method: Circle System Utilized Preoxygenation: Pre-oxygenation with 100% oxygen Intubation Type: IV induction Ventilation: Mask ventilation without difficulty LMA: LMA inserted LMA Size: 4.0 Number of attempts: 1 Airway Equipment and Method: bite block Placement Confirmation: positive ETCO2 and breath sounds checked- equal and bilateral Tube secured with: Tape Dental Injury: Teeth and Oropharynx as per pre-operative assessment

## 2013-12-16 ENCOUNTER — Encounter (HOSPITAL_BASED_OUTPATIENT_CLINIC_OR_DEPARTMENT_OTHER): Payer: Self-pay | Admitting: Orthopedic Surgery

## 2014-11-13 ENCOUNTER — Other Ambulatory Visit: Payer: Self-pay | Admitting: Neurosurgery

## 2014-11-13 DIAGNOSIS — M5126 Other intervertebral disc displacement, lumbar region: Secondary | ICD-10-CM

## 2014-12-02 ENCOUNTER — Ambulatory Visit
Admission: RE | Admit: 2014-12-02 | Discharge: 2014-12-02 | Disposition: A | Discharge status: Home / Self Care | Payer: BLUE CROSS/BLUE SHIELD | Source: Ambulatory Visit | Attending: Neurosurgery | Admitting: Neurosurgery

## 2014-12-02 DIAGNOSIS — M5126 Other intervertebral disc displacement, lumbar region: Secondary | ICD-10-CM

## 2014-12-02 MED ORDER — DIAZEPAM 5 MG PO TABS
10.0000 mg | ORAL_TABLET | Freq: Once | ORAL | Status: AC
  Administered 2014-12-02: 10 mg via ORAL

## 2014-12-02 MED ORDER — MEPERIDINE HCL 100 MG/ML IJ SOLN
75.0000 mg | Freq: Once | INTRAMUSCULAR | Status: AC
  Administered 2014-12-02: 75 mg via INTRAMUSCULAR

## 2014-12-02 MED ORDER — ONDANSETRON HCL 4 MG/2ML IJ SOLN
4.0000 mg | Freq: Once | INTRAMUSCULAR | Status: AC
  Administered 2014-12-02: 4 mg via INTRAMUSCULAR

## 2014-12-02 MED ORDER — IOHEXOL 180 MG/ML  SOLN
20.0000 mL | Freq: Once | INTRAMUSCULAR | Status: AC | PRN
  Administered 2014-12-02: 20 mL via INTRATHECAL

## 2014-12-02 NOTE — Discharge Instructions (Signed)
Myelogram Discharge Instructions  1. Go home and rest quietly for the next 24 hours.  It is important to lie flat for the next 24 hours.  Get up only to go to the restroom.  You may lie in the bed or on a couch on your back, your stomach, your left side or your right side.  You may have one pillow under your head.  You may have pillows between your knees while you are on your side or under your knees while you are on your back.  2. DO NOT drive today.  Recline the seat as far back as it will go, while still wearing your seat belt, on the way home.  3. You may get up to go to the bathroom as needed.  You may sit up for 10 minutes to eat.  You may resume your normal diet and medications unless otherwise indicated.  Drink lots of extra fluids today and tomorrow.  4. The incidence of headache, nausea, or vomiting is about 5% (one in 20 patients).  If you develop a headache, lie flat and drink plenty of fluids until the headache goes away.  Caffeinated beverages may be helpful.  If you develop severe nausea and vomiting or a headache that does not go away with flat bed rest, call [phone].  5. You may resume normal activities after your 24 hours of bed rest is over; however, do not exert yourself strongly or do any heavy lifting tomorrow. If when you get up you have a headache when standing, go back to bed and force fluids for another 24 hours.  6. Call your physician for a follow-up appointment.  The results of your myelogram will be sent directly to your physician by the following day.  7. If you have any questions or if complications develop after you arrive home, please call [phone].  Discharge instructions have been explained to the patient.  The patient, or the person responsible for the patient, fully understands these instructions.       May resume Tramadol on Dec 03, 2014, after 11:00 am.

## 2014-12-09 ENCOUNTER — Other Ambulatory Visit: Payer: Self-pay | Admitting: Neurosurgery

## 2015-01-06 ENCOUNTER — Encounter (HOSPITAL_COMMUNITY): Payer: Self-pay

## 2015-01-06 ENCOUNTER — Encounter (HOSPITAL_COMMUNITY)
Admission: RE | Admit: 2015-01-06 | Discharge: 2015-01-06 | Disposition: A | Discharge status: Home / Self Care | Payer: BLUE CROSS/BLUE SHIELD | Source: Ambulatory Visit | Attending: Neurosurgery | Admitting: Neurosurgery

## 2015-01-06 DIAGNOSIS — I10 Essential (primary) hypertension: Secondary | ICD-10-CM | POA: Diagnosis not present

## 2015-01-06 DIAGNOSIS — F418 Other specified anxiety disorders: Secondary | ICD-10-CM | POA: Diagnosis not present

## 2015-01-06 DIAGNOSIS — Z6835 Body mass index (BMI) 35.0-35.9, adult: Secondary | ICD-10-CM | POA: Diagnosis not present

## 2015-01-06 DIAGNOSIS — M4806 Spinal stenosis, lumbar region: Secondary | ICD-10-CM | POA: Diagnosis not present

## 2015-01-06 HISTORY — DX: Malignant (primary) neoplasm, unspecified: C80.1

## 2015-01-06 LAB — CBC
HEMATOCRIT: 44 % (ref 36.0–46.0)
Hemoglobin: 14.4 g/dL (ref 12.0–15.0)
MCH: 26.9 pg (ref 26.0–34.0)
MCHC: 32.7 g/dL (ref 30.0–36.0)
MCV: 82.1 fL (ref 78.0–100.0)
Platelets: 249 10*3/uL (ref 150–400)
RBC: 5.36 MIL/uL — ABNORMAL HIGH (ref 3.87–5.11)
RDW: 13.5 % (ref 11.5–15.5)
WBC: 9.1 10*3/uL (ref 4.0–10.5)

## 2015-01-06 LAB — BASIC METABOLIC PANEL
ANION GAP: 6 (ref 5–15)
BUN: 7 mg/dL (ref 6–20)
CALCIUM: 9.4 mg/dL (ref 8.9–10.3)
CO2: 28 mmol/L (ref 22–32)
Chloride: 106 mmol/L (ref 101–111)
Creatinine, Ser: 0.62 mg/dL (ref 0.44–1.00)
GFR calc non Af Amer: >60 mL/min (ref >60)
Glucose, Bld: 95 mg/dL (ref 65–99)
Potassium: 4.1 mmol/L (ref 3.5–5.1)
SODIUM: 140 mmol/L (ref 135–145)

## 2015-01-06 LAB — SURGICAL PCR SCREEN
MRSA, PCR: NEGATIVE
STAPHYLOCOCCUS AUREUS: NEGATIVE

## 2015-01-06 NOTE — Pre-Procedure Instructions (Signed)
Sanam IJANAE MACAPAGAL 01/06/2015      [facility] - [place], [place] - [address] [place] [address] Phone: [phone] Fax: [phone]    Your procedure is scheduled on Thursday January 14, 2015 at 7:30AM.  Report to [facility] at 5:30 A.M.  Call this number if you have problems the morning of surgery:  [phone]   Remember:  Do not eat food or drink liquids after midnight.  Take these medicines the morning of surgery with A SIP OF WATER amlodipine (norvasc), estradiol (estrace).   Do not take any aspirin, NSAIDS (advil, aleve, naproxen, ibuprofen), vitamins, supplements, fish oil and/or herbal medications five days prior to surgery starting Saturday January 09, 2015.   Do not wear jewelry, make-up or nail polish.  Do not wear lotions, powders, or perfumes.   Do not shave 48 hours prior to surgery.    Do not bring valuables to the hospital.  [facility] is not responsible for any belongings or valuables.  Contacts, dentures or bridgework may not be worn into surgery.  Leave your suitcase in the car.  After surgery it may be brought to your room.  For patients admitted to the hospital, discharge time will be determined by your treatment team.  Patients discharged the day of surgery will not be allowed to drive home.   See additional information for shower instructions.  Please read over the following fact sheets that you were given. Pain Booklet, Coughing and Deep Breathing, MRSA Information and Surgical Site Infection Prevention

## 2015-01-06 NOTE — Progress Notes (Signed)
   01/06/15 1028  OBSTRUCTIVE SLEEP APNEA  Have you ever been diagnosed with sleep apnea through a sleep study? No  Do you snore loudly (loud enough to be heard through closed doors)?  0  Do you often feel tired, fatigued, or sleepy during the daytime? 1  Has anyone observed you stop breathing during your sleep? 0  Do you have, or are you being treated for high blood pressure? 1  BMI more than 35 kg/m2? 1  Age over 37 years old? 0  Neck circumference greater than 40 cm/16 inches? 1 (17)  Gender: 0  Obstructive Sleep Apnea Score 4

## 2015-01-13 MED ORDER — DEXAMETHASONE SODIUM PHOSPHATE 10 MG/ML IJ SOLN
10.0000 mg | INTRAMUSCULAR | Status: AC
  Administered 2015-01-14: 10 mg via INTRAVENOUS
  Filled 2015-01-13: qty 1

## 2015-01-13 MED ORDER — CEFAZOLIN SODIUM-DEXTROSE 2-3 GM-% IV SOLR
2.0000 g | INTRAVENOUS | Status: AC
  Administered 2015-01-14: 2 g via INTRAVENOUS
  Filled 2015-01-13: qty 50

## 2015-01-14 ENCOUNTER — Encounter (HOSPITAL_COMMUNITY): Payer: Self-pay | Admitting: *Deleted

## 2015-01-14 ENCOUNTER — Ambulatory Visit (HOSPITAL_COMMUNITY)
Admission: RE | Admit: 2015-01-14 | Discharge: 2015-01-15 | Disposition: A | Discharge status: Home / Self Care | Payer: BLUE CROSS/BLUE SHIELD | Source: Ambulatory Visit | Attending: Neurosurgery | Admitting: Neurosurgery

## 2015-01-14 ENCOUNTER — Inpatient Hospital Stay (HOSPITAL_COMMUNITY): Payer: BLUE CROSS/BLUE SHIELD | Admitting: Anesthesiology

## 2015-01-14 ENCOUNTER — Inpatient Hospital Stay (HOSPITAL_COMMUNITY): Payer: BLUE CROSS/BLUE SHIELD

## 2015-01-14 ENCOUNTER — Encounter (HOSPITAL_COMMUNITY)
Admission: RE | Disposition: A | Discharge status: Home / Self Care | Payer: Self-pay | Source: Ambulatory Visit | Attending: Neurosurgery

## 2015-01-14 DIAGNOSIS — M48061 Spinal stenosis, lumbar region without neurogenic claudication: Secondary | ICD-10-CM | POA: Diagnosis present

## 2015-01-14 DIAGNOSIS — M4806 Spinal stenosis, lumbar region: Secondary | ICD-10-CM | POA: Diagnosis not present

## 2015-01-14 DIAGNOSIS — Z6835 Body mass index (BMI) 35.0-35.9, adult: Secondary | ICD-10-CM | POA: Insufficient documentation

## 2015-01-14 DIAGNOSIS — I10 Essential (primary) hypertension: Secondary | ICD-10-CM | POA: Insufficient documentation

## 2015-01-14 DIAGNOSIS — Z419 Encounter for procedure for purposes other than remedying health state, unspecified: Secondary | ICD-10-CM

## 2015-01-14 DIAGNOSIS — F418 Other specified anxiety disorders: Secondary | ICD-10-CM | POA: Insufficient documentation

## 2015-01-14 HISTORY — PX: LUMBAR LAMINECTOMY WITH COFLEX 1 LEVEL: SHX6514

## 2015-01-14 SURGERY — LUMBAR LAMINECTOMY WITH COFLEX 1 LEVEL
Anesthesia: General | Site: Back | Laterality: Bilateral

## 2015-01-14 MED ORDER — FENTANYL CITRATE (PF) 250 MCG/5ML IJ SOLN
INTRAMUSCULAR | Status: AC
  Filled 2015-01-14: qty 5

## 2015-01-14 MED ORDER — MENTHOL 3 MG MT LOZG: 1.0000 | LOZENGE | OROMUCOSAL | Status: DC | PRN

## 2015-01-14 MED ORDER — KETOROLAC TROMETHAMINE 30 MG/ML IJ SOLN
30.0000 mg | Freq: Four times a day (QID) | INTRAMUSCULAR | Status: DC
  Administered 2015-01-14 – 2015-01-15 (×4): 30 mg via INTRAVENOUS
  Filled 2015-01-14 (×8): qty 1

## 2015-01-14 MED ORDER — ROCURONIUM BROMIDE 100 MG/10ML IV SOLN
INTRAVENOUS | Status: DC | PRN
  Administered 2015-01-14: 50 mg via INTRAVENOUS

## 2015-01-14 MED ORDER — HYDROMORPHONE HCL 1 MG/ML IJ SOLN
INTRAMUSCULAR | Status: AC
  Administered 2015-01-14: 0.5 mg via INTRAVENOUS
  Filled 2015-01-14: qty 1

## 2015-01-14 MED ORDER — EPHEDRINE SULFATE 50 MG/ML IJ SOLN
INTRAMUSCULAR | Status: AC
  Filled 2015-01-14: qty 1

## 2015-01-14 MED ORDER — LOSARTAN POTASSIUM 50 MG PO TABS
100.0000 mg | ORAL_TABLET | Freq: Every day | ORAL | Status: DC
  Administered 2015-01-14 – 2015-01-15 (×2): 100 mg via ORAL
  Filled 2015-01-14 (×2): qty 2

## 2015-01-14 MED ORDER — PROPOFOL 10 MG/ML IV BOLUS
INTRAVENOUS | Status: AC
  Filled 2015-01-14: qty 20

## 2015-01-14 MED ORDER — ACETAMINOPHEN 650 MG RE SUPP: 650.0000 mg | RECTAL | Status: DC | PRN

## 2015-01-14 MED ORDER — GLYCOPYRROLATE 0.2 MG/ML IJ SOLN
INTRAMUSCULAR | Status: DC | PRN
Start: 2015-01-14 — End: 2015-01-14
  Administered 2015-01-14: 0.4 mg via INTRAVENOUS

## 2015-01-14 MED ORDER — PHENYLEPHRINE 40 MCG/ML (10ML) SYRINGE FOR IV PUSH (FOR BLOOD PRESSURE SUPPORT)
PREFILLED_SYRINGE | INTRAVENOUS | Status: AC
Start: 2015-01-14 — End: 2015-01-14
  Filled 2015-01-14: qty 10

## 2015-01-14 MED ORDER — LIDOCAINE HCL (CARDIAC) 20 MG/ML IV SOLN
INTRAVENOUS | Status: DC | PRN
  Administered 2015-01-14: 100 mg via INTRAVENOUS

## 2015-01-14 MED ORDER — ONDANSETRON HCL 4 MG/2ML IJ SOLN
INTRAMUSCULAR | Status: DC | PRN
  Administered 2015-01-14: 4 mg via INTRAVENOUS

## 2015-01-14 MED ORDER — FENTANYL CITRATE (PF) 250 MCG/5ML IJ SOLN
INTRAMUSCULAR | Status: DC | PRN
  Administered 2015-01-14: 150 ug via INTRAVENOUS
  Administered 2015-01-14: 50 ug via INTRAVENOUS

## 2015-01-14 MED ORDER — 0.9 % SODIUM CHLORIDE (POUR BTL) OPTIME
TOPICAL | Status: DC | PRN
  Administered 2015-01-14: 1000 mL

## 2015-01-14 MED ORDER — SODIUM CHLORIDE 0.9 % IJ SOLN
INTRAMUSCULAR | Status: AC
  Filled 2015-01-14: qty 10

## 2015-01-14 MED ORDER — SODIUM CHLORIDE 0.9 % IV SOLN: 250.0000 mL | INTRAVENOUS | Status: DC

## 2015-01-14 MED ORDER — HYDROMORPHONE HCL 1 MG/ML IJ SOLN
1.0000 mg | INTRAMUSCULAR | Status: DC | PRN
  Administered 2015-01-14 (×2): 1 mg via INTRAMUSCULAR
  Filled 2015-01-14 (×2): qty 1

## 2015-01-14 MED ORDER — ONDANSETRON HCL 4 MG/2ML IJ SOLN
4.0000 mg | INTRAMUSCULAR | Status: DC | PRN
  Administered 2015-01-14: 4 mg via INTRAVENOUS
  Filled 2015-01-14: qty 2

## 2015-01-14 MED ORDER — MIDAZOLAM HCL 2 MG/2ML IJ SOLN
INTRAMUSCULAR | Status: AC
  Filled 2015-01-14: qty 2

## 2015-01-14 MED ORDER — METHOCARBAMOL 500 MG PO TABS
ORAL_TABLET | ORAL | Status: AC
  Administered 2015-01-14: 500 mg via ORAL
  Filled 2015-01-14: qty 1

## 2015-01-14 MED ORDER — BISACODYL 5 MG PO TBEC
5.0000 mg | DELAYED_RELEASE_TABLET | Freq: Every day | ORAL | Status: DC | PRN
Start: 2015-01-14 — End: 2015-01-15
  Filled 2015-01-14: qty 1

## 2015-01-14 MED ORDER — PANTOPRAZOLE SODIUM 40 MG IV SOLR
40.0000 mg | Freq: Every day | INTRAVENOUS | Status: DC
  Administered 2015-01-14: 40 mg via INTRAVENOUS
  Filled 2015-01-14 (×2): qty 40

## 2015-01-14 MED ORDER — PROMETHAZINE HCL 25 MG/ML IJ SOLN: 6.2500 mg | INTRAMUSCULAR | Status: DC | PRN

## 2015-01-14 MED ORDER — ZOLPIDEM TARTRATE 5 MG PO TABS: 5.0000 mg | ORAL_TABLET | Freq: Every evening | ORAL | Status: DC | PRN

## 2015-01-14 MED ORDER — ROCURONIUM BROMIDE 50 MG/5ML IV SOLN
INTRAVENOUS | Status: AC
  Filled 2015-01-14: qty 1

## 2015-01-14 MED ORDER — LACTATED RINGERS IV SOLN
INTRAVENOUS | Status: DC | PRN
  Administered 2015-01-14 (×2): via INTRAVENOUS

## 2015-01-14 MED ORDER — AMLODIPINE BESYLATE 5 MG PO TABS
5.0000 mg | ORAL_TABLET | Freq: Every day | ORAL | Status: DC
  Administered 2015-01-15: 5 mg via ORAL
  Filled 2015-01-14: qty 1

## 2015-01-14 MED ORDER — METHOCARBAMOL 500 MG PO TABS
500.0000 mg | ORAL_TABLET | Freq: Four times a day (QID) | ORAL | Status: DC | PRN
  Administered 2015-01-14 – 2015-01-15 (×3): 500 mg via ORAL
  Filled 2015-01-14 (×2): qty 1

## 2015-01-14 MED ORDER — NEOSTIGMINE METHYLSULFATE 10 MG/10ML IV SOLN
INTRAVENOUS | Status: DC | PRN
  Administered 2015-01-14: 3 mg via INTRAVENOUS

## 2015-01-14 MED ORDER — CEFAZOLIN SODIUM-DEXTROSE 2-3 GM-% IV SOLR
2.0000 g | Freq: Three times a day (TID) | INTRAVENOUS | Status: AC
  Administered 2015-01-14 (×2): 2 g via INTRAVENOUS
  Filled 2015-01-14 (×2): qty 50

## 2015-01-14 MED ORDER — SODIUM CHLORIDE 0.9 % IJ SOLN
3.0000 mL | Freq: Two times a day (BID) | INTRAMUSCULAR | Status: DC
Start: 2015-01-14 — End: 2015-01-15
  Administered 2015-01-14: 3 mL via INTRAVENOUS

## 2015-01-14 MED ORDER — DEXAMETHASONE 4 MG PO TABS
4.0000 mg | ORAL_TABLET | Freq: Four times a day (QID) | ORAL | Status: AC
  Administered 2015-01-14 (×2): 4 mg via ORAL
  Filled 2015-01-14 (×2): qty 1

## 2015-01-14 MED ORDER — ACETAMINOPHEN 325 MG PO TABS: 650.0000 mg | ORAL_TABLET | ORAL | Status: DC | PRN

## 2015-01-14 MED ORDER — PHENOL 1.4 % MT LIQD: 1.0000 | OROMUCOSAL | Status: DC | PRN

## 2015-01-14 MED ORDER — SODIUM CHLORIDE 0.9 % IJ SOLN: 3.0000 mL | INTRAMUSCULAR | Status: DC | PRN

## 2015-01-14 MED ORDER — THROMBIN 5000 UNITS EX SOLR
CUTANEOUS | Status: DC | PRN
  Administered 2015-01-14 (×2): 5000 [IU] via TOPICAL

## 2015-01-14 MED ORDER — OXYCODONE-ACETAMINOPHEN 5-325 MG PO TABS
1.0000 | ORAL_TABLET | ORAL | Status: DC | PRN
  Administered 2015-01-14 – 2015-01-15 (×6): 2 via ORAL
  Filled 2015-01-14 (×6): qty 2

## 2015-01-14 MED ORDER — METHOCARBAMOL 1000 MG/10ML IJ SOLN
500.0000 mg | Freq: Four times a day (QID) | INTRAVENOUS | Status: DC | PRN
  Filled 2015-01-14: qty 5

## 2015-01-14 MED ORDER — SUCCINYLCHOLINE CHLORIDE 20 MG/ML IJ SOLN
INTRAMUSCULAR | Status: AC
  Filled 2015-01-14: qty 1

## 2015-01-14 MED ORDER — PROPOFOL 10 MG/ML IV BOLUS
INTRAVENOUS | Status: DC | PRN
  Administered 2015-01-14: 200 mg via INTRAVENOUS

## 2015-01-14 MED ORDER — HYDROMORPHONE HCL 1 MG/ML IJ SOLN
0.2500 mg | INTRAMUSCULAR | Status: DC | PRN
  Administered 2015-01-14 (×4): 0.5 mg via INTRAVENOUS

## 2015-01-14 MED ORDER — LIDOCAINE HCL (CARDIAC) 20 MG/ML IV SOLN
INTRAVENOUS | Status: AC
  Filled 2015-01-14: qty 5

## 2015-01-14 MED ORDER — BUPIVACAINE HCL (PF) 0.5 % IJ SOLN
INTRAMUSCULAR | Status: DC | PRN
  Administered 2015-01-14: 10 mL

## 2015-01-14 MED ORDER — DOCUSATE SODIUM 100 MG PO CAPS
100.0000 mg | ORAL_CAPSULE | Freq: Two times a day (BID) | ORAL | Status: DC
  Administered 2015-01-14 – 2015-01-15 (×2): 100 mg via ORAL
  Filled 2015-01-14: qty 1

## 2015-01-14 MED ORDER — HEMOSTATIC AGENTS (NO CHARGE) OPTIME
TOPICAL | Status: DC | PRN
  Administered 2015-01-14: 1 via TOPICAL

## 2015-01-14 MED ORDER — MIDAZOLAM HCL 5 MG/5ML IJ SOLN
INTRAMUSCULAR | Status: DC | PRN
  Administered 2015-01-14: 2 mg via INTRAVENOUS

## 2015-01-14 MED ORDER — DEXAMETHASONE SODIUM PHOSPHATE 4 MG/ML IJ SOLN: 4.0000 mg | Freq: Four times a day (QID) | INTRAMUSCULAR | Status: AC

## 2015-01-14 MED ORDER — ESTRADIOL 2 MG PO TABS
2.0000 mg | ORAL_TABLET | Freq: Every day | ORAL | Status: DC
  Administered 2015-01-15: 2 mg via ORAL
  Filled 2015-01-14: qty 1

## 2015-01-14 MED ORDER — HYDROCHLOROTHIAZIDE 25 MG PO TABS
25.0000 mg | ORAL_TABLET | Freq: Every day | ORAL | Status: DC
  Administered 2015-01-14 – 2015-01-15 (×2): 25 mg via ORAL
  Filled 2015-01-14 (×2): qty 1

## 2015-01-14 MED ORDER — KCL IN DEXTROSE-NACL 20-5-0.45 MEQ/L-%-% IV SOLN
80.0000 mL/h | INTRAVENOUS | Status: DC
  Administered 2015-01-14: 80 mL/h via INTRAVENOUS
  Filled 2015-01-14 (×4): qty 1000

## 2015-01-14 MED ORDER — SODIUM CHLORIDE 0.9 % IR SOLN
Status: DC | PRN
  Administered 2015-01-14: 500 mL

## 2015-01-14 SURGICAL SUPPLY — 43 items
BAG DECANTER FOR FLEXI CONT (MISCELLANEOUS) ×3 IMPLANT
BENZOIN TINCTURE PRP APPL 2/3 (GAUZE/BANDAGES/DRESSINGS) ×3 IMPLANT
BLADE CLIPPER SURG (BLADE) ×3 IMPLANT
BRUSH SCRUB EZ PLAIN DRY (MISCELLANEOUS) ×3 IMPLANT
BUR CUTTER 7.0 ROUND (BURR) ×3 IMPLANT
BUR MATCHSTICK NEURO 3.0 LAGG (BURR) IMPLANT
CANISTER SUCT 3000ML PPV (MISCELLANEOUS) ×3 IMPLANT
CLOSURE WOUND 1/2 X4 (GAUZE/BANDAGES/DRESSINGS) ×1
CONT SPEC 4OZ CLIKSEAL STRL BL (MISCELLANEOUS) ×3 IMPLANT
DRAPE C-ARM 42X72 X-RAY (DRAPES) ×6 IMPLANT
DRAPE LAPAROTOMY 100X72X124 (DRAPES) ×3 IMPLANT
DRAPE SURG 17X23 STRL (DRAPES) ×6 IMPLANT
DRSG OPSITE POSTOP 4X6 (GAUZE/BANDAGES/DRESSINGS) ×3 IMPLANT
DURAPREP 26ML APPLICATOR (WOUND CARE) ×3 IMPLANT
ELECT REM PT RETURN 9FT ADLT (ELECTROSURGICAL) ×3
ELECTRODE REM PT RTRN 9FT ADLT (ELECTROSURGICAL) ×1 IMPLANT
GAUZE SPONGE 4X4 12PLY STRL (GAUZE/BANDAGES/DRESSINGS) ×3 IMPLANT
GAUZE SPONGE 4X4 16PLY XRAY LF (GAUZE/BANDAGES/DRESSINGS) IMPLANT
GLOVE ECLIPSE 8.0 STRL XLNG CF (GLOVE) ×3 IMPLANT
GOWN STRL REUS W/ TWL LRG LVL3 (GOWN DISPOSABLE) ×3 IMPLANT
GOWN STRL REUS W/ TWL XL LVL3 (GOWN DISPOSABLE) ×1 IMPLANT
GOWN STRL REUS W/TWL 2XL LVL3 (GOWN DISPOSABLE) IMPLANT
GOWN STRL REUS W/TWL LRG LVL3 (GOWN DISPOSABLE) ×6
GOWN STRL REUS W/TWL XL LVL3 (GOWN DISPOSABLE) ×2
KIT BASIN OR (CUSTOM PROCEDURE TRAY) ×3 IMPLANT
KIT ROOM TURNOVER OR (KITS) ×3 IMPLANT
LIQUID BAND (GAUZE/BANDAGES/DRESSINGS) ×3 IMPLANT
NEEDLE HYPO 22GX1.5 SAFETY (NEEDLE) ×3 IMPLANT
NEEDLE SPNL 22GX3.5 QUINCKE BK (NEEDLE) ×6 IMPLANT
NS IRRIG 1000ML POUR BTL (IV SOLUTION) ×3 IMPLANT
PACK LAMINECTOMY NEURO (CUSTOM PROCEDURE TRAY) ×3 IMPLANT
PAD ARMBOARD 7.5X6 YLW CONV (MISCELLANEOUS) ×9 IMPLANT
PATTIES SURGICAL .75X.75 (GAUZE/BANDAGES/DRESSINGS) ×3 IMPLANT
SPONGE SURGIFOAM ABS GEL SZ50 (HEMOSTASIS) ×3 IMPLANT
STRIP CLOSURE SKIN 1/2X4 (GAUZE/BANDAGES/DRESSINGS) ×2 IMPLANT
SUT PROLENE 0 CT 1 30 (SUTURE) IMPLANT
SUT VIC AB 2-0 OS6 18 (SUTURE) ×9 IMPLANT
SUT VIC AB 3-0 CP2 18 (SUTURE) ×3 IMPLANT
SYR 20ML ECCENTRIC (SYRINGE) ×3 IMPLANT
TAPE STRIPS DRAPE STRL (GAUZE/BANDAGES/DRESSINGS) ×3 IMPLANT
TOWEL OR 17X24 6PK STRL BLUE (TOWEL DISPOSABLE) ×3 IMPLANT
TOWEL OR 17X26 10 PK STRL BLUE (TOWEL DISPOSABLE) ×3 IMPLANT
WATER STERILE IRR 1000ML POUR (IV SOLUTION) ×3 IMPLANT

## 2015-01-14 NOTE — H&P (Signed)
[name] is an 39 y.o. female.   Chief Complaint: Back pain with radiation to the legs HPI: The patient is a 39 year old female who is evaluated in the office for back pain with radiation the legs which is equal bilaterally. She's had this problem for a few months with no inciting event. It started off with more pain on the left but has, the patellar both sides are equally affected. She's saw an orthopedist who got an MRI scan. She had 2 epidural shots without relief. She was then evaluated in the office after failing anti-inflammatory medication. The patient states that standing increases the pain the most and walking increases it laterally. After evaluation the office the patient was tried an additional conservative therapy without improvement. 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 After discussing the options the patient requested surgery and now comes for a bilateral decompressive laminotomy at L4-5. I've had a long discussion with her regarding the risks and benefits of surgical intervention. The risks discussed include but are not limited to bleeding infection weakness numbness paralysis spinal fluid leak coma and death. We have discussed alternative methods of therapy along with the risks and benefits of nonintervention. She has had the opportunity to ask numerous questions and appears to understand. With this information in hand she has requested that we proceed with surgery  Past Medical History  Diagnosis Date  . Depression   . Anxiety   . Migraines   . Carpal tunnel syndrome of right wrist 11/2013  . Hypertension     is on 3 meds. - states BP is under control; has been on med. x 12 yr.  . Ganglion cyst of wrist 11/2013    right  . Right tennis elbow 11/2013  . Cancer     ovarian cancer    Past Surgical History  Procedure Laterality Date  . Cholecystectomy  02/11/2004    lap. chole.  . Cesarean section  03/11/2005; 04/05/2008  . Tubal  ligation  04/05/2008  . Abdominal hysterectomy      complete  . Carpal tunnel release Right 12/12/2013    Procedure: ENDOSCOPY RIGHT WRIST CARPAL TUNNEL RELEASE EXCISION GANGLION WRIST PRIMARY ;  Surgeon: Renette Butters, MD;  Location: [facility];  Service: Orthopedics;  Laterality: Right;  . Lateral epicondyle release Right 12/12/2013    Procedure: RIGHT TENNIS ELBOW RELEASE WITH BONE REPAIR;  Surgeon: Renette Butters, MD;  Location: [facility];  Service: Orthopedics;  Laterality: Right;    History reviewed. No pertinent family history. Social History:  reports that she has never smoked. She has never used smokeless tobacco. She reports that she does not drink alcohol or use illicit drugs.  Allergies: No Known Allergies  Medications Prior to Admission  Medication Sig Dispense Refill  . amLODipine (NORVASC) 5 MG tablet Take 5 mg by mouth daily.    Marland Kitchen estradiol (ESTRACE) 2 MG tablet Take 2 mg by mouth daily.    . hydrochlorothiazide (HYDRODIURIL) 25 MG tablet Take 25 mg by mouth every morning.     Marland Kitchen losartan (COZAAR) 100 MG tablet Take 100 mg by mouth daily.    Marland Kitchen docusate sodium (COLACE) 100 MG capsule Take 1 capsule (100 mg total) by mouth 2 (two) times daily. Continue this while taking narcotics to help with bowel movements (Patient not taking: Reported on 01/04/2015) 30 capsule 1  . ondansetron (ZOFRAN) 4 MG tablet Take 1 tablet (4 mg total) by mouth every  8 (eight) hours as needed for nausea. (Patient not taking: Reported on 01/04/2015) 60 tablet 0  . oxyCODONE (ROXICODONE) 5 MG immediate release tablet Take 2 tablets (10 mg total) by mouth every 4 (four) hours as needed for severe pain. (Patient not taking: Reported on 01/04/2015) 90 tablet 0    No results found for this or any previous visit (from the past 48 hour(s)). No results found.  A comprehensive review of systems was negative.  Blood pressure 144/100, pulse 89, temperature 98.1 F (36.7 C),  temperature source Oral, resp. rate 20, weight 100.699 kg (222 lb), last menstrual period 09/17/2010, SpO2 96 %.  The patient is awake alert and oriented. She has no facial asymmetry. Reflexes slightly decreased at the right ankle jerk versus the left. She has mild weakness of the extensor hallucis longus on the right. 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 The plan is for a bilateral L4-5 decompressive laminotomy.  Faythe Ghee, MD 01/14/2015, 7:27 AM

## 2015-01-14 NOTE — Anesthesia Preprocedure Evaluation (Addendum)
Anesthesia Evaluation  Patient identified by MRN, date of birth, ID band Patient awake    Reviewed: Allergy & Precautions, NPO status , Patient's Chart, lab work & pertinent test results  History of Anesthesia Complications Negative for: history of anesthetic complications  Airway Mallampati: II       Dental  (+) Teeth Intact, Dental Advisory Given   Pulmonary neg pulmonary ROS,    Pulmonary exam normal       Cardiovascular hypertension, Pt. on medications Normal cardiovascular exam    Neuro/Psych Anxiety Depression    GI/Hepatic negative GI ROS, Neg liver ROS,   Endo/Other  Morbid obesity  Renal/GU negative Renal ROS     Musculoskeletal   Abdominal   Peds  Hematology   Anesthesia Other Findings   Reproductive/Obstetrics                            Anesthesia Physical Anesthesia Plan  ASA: II  Anesthesia Plan: General   Post-op Pain Management:    Induction: Intravenous  Airway Management Planned: Oral ETT  Additional Equipment:   Intra-op Plan:   Post-operative Plan: Extubation in OR  Informed Consent: I have reviewed the patients History and Physical, chart, labs and discussed the procedure including the risks, benefits and alternatives for the proposed anesthesia with the patient or authorized representative who has indicated his/her understanding and acceptance.   Dental advisory given  Plan Discussed with: CRNA, Anesthesiologist and Surgeon  Anesthesia Plan Comments:         Anesthesia Quick Evaluation

## 2015-01-14 NOTE — Anesthesia Postprocedure Evaluation (Signed)
Anesthesia Post Note  Patient: [name]  Procedure(s) Performed: Procedure(s) (LRB): Laminectomy - bilateral - L4-L5  (Bilateral)  Anesthesia type: general  Patient location: PACU  Post pain: Pain level controlled  Post assessment: Patient's Cardiovascular Status Stable  Last Vitals:  Filed Vitals:   01/14/15 1022  BP:   Pulse: 69  Temp: 36.7 C  Resp: 21    Post vital signs: Reviewed and stable  Level of consciousness: sedated  Complications: No apparent anesthesia complications

## 2015-01-14 NOTE — Anesthesia Procedure Notes (Signed)
Procedure Name: Intubation Date/Time: 01/14/2015 7:42 AM Performed by: Julian Reil Pre-anesthesia Checklist: Patient identified, Emergency Drugs available, Suction available and Patient being monitored Patient Re-evaluated:Patient Re-evaluated prior to inductionOxygen Delivery Method: Circle system utilized Preoxygenation: Pre-oxygenation with 100% oxygen Intubation Type: IV induction Ventilation: Mask ventilation without difficulty Laryngoscope Size: Mac and 3 Grade View: Grade I Tube type: Oral Tube size: 7.5 mm Number of attempts: 1 Airway Equipment and Method: Stylet Placement Confirmation: ETT inserted through vocal cords under direct vision,  positive ETCO2 and breath sounds checked- equal and bilateral Secured at: 21 cm Tube secured with: Tape Dental Injury: Teeth and Oropharynx as per pre-operative assessment

## 2015-01-14 NOTE — Transfer of Care (Signed)
Immediate Anesthesia Transfer of Care Note  Patient: [name]  Procedure(s) Performed: Procedure(s) with comments: Laminectomy - bilateral - L4-L5  (Bilateral) - Laminectomy - bilateral - L4-L5   Patient Location: PACU  Anesthesia Type:General  Level of Consciousness: awake, alert , oriented and patient cooperative  Airway & Oxygen Therapy: Patient Spontanous Breathing and Patient connected to nasal cannula oxygen  Post-op Assessment: Report given to RN, Post -op Vital signs reviewed and stable and Patient moving all extremities  Post vital signs: Reviewed and stable  Last Vitals:  Filed Vitals:   01/14/15 0602  BP: 144/100  Pulse: 89  Temp: 36.7 C  Resp: 20    Complications: No apparent anesthesia complications

## 2015-01-14 NOTE — Op Note (Signed)
Preop diagnosis: Spinal stenosis with lateral recess stenosis L4-5 bilateral Postop diagnosis: Same Procedure: L4-5 bilateral decompressive laminotomy for resolution of lateral recess and central stenosis Surgeon: Emiyah Spraggins Asst.: Arnoldo Morale  After being placed the prone position the patient's back was prepped and draped in the usual sterile fashion. Localizing x-rays taken in the midline incision was made above the spinous processes of L4 and L5. The incision was carried on the spinous processes. Subperiosteal dissection was then carried out bilaterally along the spinous processes lamina facet joint and self-retaining retractor was placed for exposure. X-ray showed approach the appropriate level. Starting the patient's left side laminotomy was performed by removing the inferior one half of the L4 lamina the medial one third of the facet joint the superior one third of the L5 lamina. Residual bone and hypertrophic ligamentum flavum removed in a piecemeal fashion. L5 nerve root was identified and followed out its foramen without difficulty. Similar decompression was then carried out on the opposite side. Once again, generous decompression of lateral recess was carried out and the L5 nerve root followed out its foramen. At this time inspection was carried out in all directions for any evidence of residual compression and none could be identified. Irrigation was carried out and any bleeding control proper coagulation Gelfoam. The wound was then closed in multiple layers of Vicryls on the muscle fascia subcutaneous and subcuticular tissues. Dermabond and Steri-Strips were placed on the skin. A sterile dressing was then applied the patient was extubated and taken to recovery room in stable condition.

## 2015-01-15 ENCOUNTER — Encounter (HOSPITAL_COMMUNITY): Payer: Self-pay | Admitting: Neurosurgery

## 2015-01-15 DIAGNOSIS — M4806 Spinal stenosis, lumbar region: Secondary | ICD-10-CM | POA: Diagnosis not present

## 2015-01-15 MED ORDER — OXYCODONE-ACETAMINOPHEN 5-325 MG PO TABS: 1.0000 | ORAL_TABLET | ORAL | Status: DC | PRN

## 2015-01-15 MED ORDER — PANTOPRAZOLE SODIUM 40 MG PO TBEC: 40.0000 mg | DELAYED_RELEASE_TABLET | Freq: Every day | ORAL | Status: DC

## 2015-01-15 NOTE — Progress Notes (Signed)
Pt given D/C instructions with Rx's, verbal understanding was provided. Pt's IV was removed prior to D/C. Pt's incision is clean and dry. Pt D/C'd home via wheelchair @ 1630 per MD order. Pt is stable @ D/C and has no other needs at this time. Holli Humbles, RN

## 2015-01-15 NOTE — Discharge Instructions (Signed)
Wound Care Keep incision covered and dry for one week. You may shower from the neck down. You may remove outer bandage after one week.  Do not put any creams, lotions, or ointments on incision. Leave steri-strips on neck.  They will fall off by themselves. Activity Walk each and every day, increasing distance each day. No lifting greater than 5 lbs.  Avoid bending, arching, and twisting. No driving or riding in car until further notice at follow up appointment. If provided with back brace, wear when out of bed.  It is not necessary to wear brace in bed. Diet Resume your normal diet.  Return to Work Will be discussed at you follow up appointment. Call Your Doctor If Any of These Occur Redness, drainage, or swelling at the wound.  Temperature greater than 101 degrees. Severe pain not relieved by pain medication. Incision starts to come apart. Follow Up Appt Call today for appointment in 1-2 weeks ([phone]) or for problems.  If you have any hardware placed in your spine, you will need an x-ray before your appointment.

## 2015-01-15 NOTE — Discharge Summary (Signed)
  Physician Discharge Summary  Patient ID: [name] MRN: [id number] DOB/AGE: 37-Oct-1977 37 y.o.  Admit date: 01/14/2015 Discharge date: 01/15/2015  Admission Diagnoses:  Discharge Diagnoses:  Active Problems:   Spinal stenosis, lumbar   Discharged Condition: good  Hospital Course: Surgery yesterday for L45 decompression. Did well. Marked improvement in her pain. Wound clean and dry. Ambulated well. Home pod 1, specific instructions given.  Consults: None  Significant Diagnostic Studies: none  Treatments: surgery: Bilateral L 45 decompression  Discharge Exam: Blood pressure 109/75, pulse 62, temperature 98.1 F (36.7 C), temperature source Oral, resp. rate 18, weight 100.699 kg (222 lb), last menstrual period 09/17/2010, SpO2 95 %. Incision/Wound:clean and dry; no new neuro issues  Disposition: 01-Home or Self Care     Medication List    ASK your doctor about these medications        amLODipine 5 MG tablet  Commonly known as:  NORVASC  Take 5 mg by mouth daily.     docusate sodium 100 MG capsule  Commonly known as:  COLACE  Take 1 capsule (100 mg total) by mouth 2 (two) times daily. Continue this while taking narcotics to help with bowel movements     estradiol 2 MG tablet  Commonly known as:  ESTRACE  Take 2 mg by mouth daily.     hydrochlorothiazide 25 MG tablet  Commonly known as:  HYDRODIURIL  Take 25 mg by mouth every morning.     losartan 100 MG tablet  Commonly known as:  COZAAR  Take 100 mg by mouth daily.     ondansetron 4 MG tablet  Commonly known as:  ZOFRAN  Take 1 tablet (4 mg total) by mouth every 8 (eight) hours as needed for nausea.     oxyCODONE 5 MG immediate release tablet  Commonly known as:  ROXICODONE  Take 2 tablets (10 mg total) by mouth every 4 (four) hours as needed for severe pain.         At home rest most of the time. Get up 9 or 10 times each day and take a 15 or 20 minute walk. No riding in the car and to your  first postoperative appointment. If you have neck surgery you may shower from the chest down starting on the third postoperative day. If you had back surgery he may start showering on the third postoperative day with saran wrap wrapped around your incisional area 3 times. After the shower remove the saran wrap. Take pain medicine as needed and other medications as instructed. Call my office for an appointment.  SignedFaythe Ghee, MD 01/15/2015, 8:49 AM

## 2015-02-01 ENCOUNTER — Encounter (HOSPITAL_COMMUNITY): Payer: Self-pay | Admitting: *Deleted

## 2015-02-01 ENCOUNTER — Other Ambulatory Visit: Payer: Self-pay | Admitting: Neurosurgery

## 2015-02-01 NOTE — Progress Notes (Signed)
Called Dr. Cleotilde Neer office to request pre-op orders be signed, spoke with Manuela Schwartz.

## 2015-02-02 ENCOUNTER — Ambulatory Visit (HOSPITAL_COMMUNITY): Payer: BLUE CROSS/BLUE SHIELD | Admitting: Anesthesiology

## 2015-02-02 ENCOUNTER — Encounter (HOSPITAL_COMMUNITY): Payer: Self-pay | Admitting: Anesthesiology

## 2015-02-02 ENCOUNTER — Encounter (HOSPITAL_COMMUNITY)
Admission: RE | Disposition: A | Discharge status: Home / Self Care | Payer: Self-pay | Source: Ambulatory Visit | Attending: Neurosurgery

## 2015-02-02 ENCOUNTER — Ambulatory Visit (HOSPITAL_COMMUNITY)
Admission: RE | Admit: 2015-02-02 | Discharge: 2015-02-02 | Disposition: A | Discharge status: Home / Self Care | Payer: BLUE CROSS/BLUE SHIELD | Source: Ambulatory Visit | Attending: Neurosurgery | Admitting: Neurosurgery

## 2015-02-02 DIAGNOSIS — M1990 Unspecified osteoarthritis, unspecified site: Secondary | ICD-10-CM | POA: Insufficient documentation

## 2015-02-02 DIAGNOSIS — F419 Anxiety disorder, unspecified: Secondary | ICD-10-CM | POA: Insufficient documentation

## 2015-02-02 DIAGNOSIS — Y838 Other surgical procedures as the cause of abnormal reaction of the patient, or of later complication, without mention of misadventure at the time of the procedure: Secondary | ICD-10-CM | POA: Diagnosis not present

## 2015-02-02 DIAGNOSIS — Y9289 Other specified places as the place of occurrence of the external cause: Secondary | ICD-10-CM | POA: Insufficient documentation

## 2015-02-02 DIAGNOSIS — Z8543 Personal history of malignant neoplasm of ovary: Secondary | ICD-10-CM | POA: Diagnosis not present

## 2015-02-02 DIAGNOSIS — I10 Essential (primary) hypertension: Secondary | ICD-10-CM | POA: Insufficient documentation

## 2015-02-02 DIAGNOSIS — G709 Myoneural disorder, unspecified: Secondary | ICD-10-CM | POA: Diagnosis not present

## 2015-02-02 DIAGNOSIS — F329 Major depressive disorder, single episode, unspecified: Secondary | ICD-10-CM | POA: Diagnosis not present

## 2015-02-02 DIAGNOSIS — T8130XA Disruption of wound, unspecified, initial encounter: Secondary | ICD-10-CM | POA: Insufficient documentation

## 2015-02-02 HISTORY — PX: LUMBAR WOUND DEBRIDEMENT: SHX1988

## 2015-02-02 LAB — GRAM STAIN

## 2015-02-02 LAB — BASIC METABOLIC PANEL
Anion gap: 10 (ref 5–15)
BUN: 12 mg/dL (ref 6–20)
CO2: 26 mmol/L (ref 22–32)
CREATININE: 0.67 mg/dL (ref 0.44–1.00)
Calcium: 9.5 mg/dL (ref 8.9–10.3)
Chloride: 101 mmol/L (ref 101–111)
Glucose, Bld: 86 mg/dL (ref 65–99)
Potassium: 3.8 mmol/L (ref 3.5–5.1)
Sodium: 137 mmol/L (ref 135–145)

## 2015-02-02 LAB — CBC
HCT: 42.8 % (ref 36.0–46.0)
Hemoglobin: 14.3 g/dL (ref 12.0–15.0)
MCH: 26.9 pg (ref 26.0–34.0)
MCHC: 33.4 g/dL (ref 30.0–36.0)
MCV: 80.5 fL (ref 78.0–100.0)
Platelets: 361 10*3/uL (ref 150–400)
RBC: 5.32 MIL/uL — ABNORMAL HIGH (ref 3.87–5.11)
RDW: 13 % (ref 11.5–15.5)
WBC: 8.5 10*3/uL (ref 4.0–10.5)

## 2015-02-02 SURGERY — LUMBAR WOUND DEBRIDEMENT
Anesthesia: General

## 2015-02-02 MED ORDER — CEFAZOLIN SODIUM-DEXTROSE 2-3 GM-% IV SOLR
INTRAVENOUS | Status: DC | PRN
  Administered 2015-02-02: 2 g via INTRAVENOUS

## 2015-02-02 MED ORDER — PROPOFOL 10 MG/ML IV BOLUS
INTRAVENOUS | Status: AC
  Filled 2015-02-02: qty 20

## 2015-02-02 MED ORDER — LIDOCAINE HCL (CARDIAC) 20 MG/ML IV SOLN
INTRAVENOUS | Status: AC
  Filled 2015-02-02: qty 10

## 2015-02-02 MED ORDER — OXYCODONE HCL 5 MG PO TABS
ORAL_TABLET | ORAL | Status: DC
Start: 2015-02-02 — End: 2015-02-02
  Filled 2015-02-02: qty 2

## 2015-02-02 MED ORDER — MIDAZOLAM HCL 2 MG/2ML IJ SOLN
INTRAMUSCULAR | Status: AC
  Filled 2015-02-02: qty 2

## 2015-02-02 MED ORDER — FENTANYL CITRATE (PF) 250 MCG/5ML IJ SOLN
INTRAMUSCULAR | Status: AC
  Filled 2015-02-02: qty 5

## 2015-02-02 MED ORDER — ONDANSETRON HCL 4 MG/2ML IJ SOLN
INTRAMUSCULAR | Status: DC | PRN
  Administered 2015-02-02: 4 mg via INTRAVENOUS

## 2015-02-02 MED ORDER — CEFAZOLIN SODIUM-DEXTROSE 2-3 GM-% IV SOLR
INTRAVENOUS | Status: AC
  Filled 2015-02-02: qty 50

## 2015-02-02 MED ORDER — OXYCODONE HCL 10 MG PO TABS: 10.0000 mg | ORAL_TABLET | ORAL | Status: DC

## 2015-02-02 MED ORDER — HEMOSTATIC AGENTS (NO CHARGE) OPTIME: TOPICAL | Status: DC | PRN

## 2015-02-02 MED ORDER — ARTIFICIAL TEARS OP OINT
TOPICAL_OINTMENT | OPHTHALMIC | Status: DC | PRN
  Administered 2015-02-02: 1 via OPHTHALMIC

## 2015-02-02 MED ORDER — LIDOCAINE HCL (CARDIAC) 20 MG/ML IV SOLN
INTRAVENOUS | Status: AC
  Filled 2015-02-02: qty 5

## 2015-02-02 MED ORDER — ONDANSETRON HCL 4 MG/2ML IJ SOLN: 4.0000 mg | Freq: Once | INTRAMUSCULAR | Status: DC | PRN

## 2015-02-02 MED ORDER — ONDANSETRON HCL 4 MG/2ML IJ SOLN
INTRAMUSCULAR | Status: AC
  Filled 2015-02-02: qty 2

## 2015-02-02 MED ORDER — FENTANYL CITRATE (PF) 100 MCG/2ML IJ SOLN
INTRAMUSCULAR | Status: DC | PRN
  Administered 2015-02-02: 100 ug via INTRAVENOUS
  Administered 2015-02-02 (×3): 50 ug via INTRAVENOUS

## 2015-02-02 MED ORDER — HYDROMORPHONE HCL 1 MG/ML IJ SOLN
0.5000 mg | INTRAMUSCULAR | Status: AC | PRN
  Administered 2015-02-02 (×4): 0.5 mg via INTRAVENOUS

## 2015-02-02 MED ORDER — HYDROMORPHONE HCL 1 MG/ML IJ SOLN
INTRAMUSCULAR | Status: AC
  Filled 2015-02-02: qty 1

## 2015-02-02 MED ORDER — OXYCODONE HCL 5 MG PO TABS
10.0000 mg | ORAL_TABLET | Freq: Once | ORAL | Status: AC
  Administered 2015-02-02: 10 mg via ORAL

## 2015-02-02 MED ORDER — SODIUM CHLORIDE 0.9 % IR SOLN
Status: DC | PRN
  Administered 2015-02-02 (×2)

## 2015-02-02 MED ORDER — ARTIFICIAL TEARS OP OINT
TOPICAL_OINTMENT | OPHTHALMIC | Status: AC
  Filled 2015-02-02: qty 3.5

## 2015-02-02 MED ORDER — HYDROMORPHONE HCL 1 MG/ML IJ SOLN
INTRAMUSCULAR | Status: DC
Start: 2015-02-02 — End: 2015-02-02
  Filled 2015-02-02: qty 1

## 2015-02-02 MED ORDER — 0.9 % SODIUM CHLORIDE (POUR BTL) OPTIME
TOPICAL | Status: DC | PRN
  Administered 2015-02-02: 1000 mL

## 2015-02-02 MED ORDER — MIDAZOLAM HCL 5 MG/5ML IJ SOLN
INTRAMUSCULAR | Status: DC | PRN
  Administered 2015-02-02: 2 mg via INTRAVENOUS

## 2015-02-02 MED ORDER — THROMBIN 5000 UNITS EX SOLR: CUTANEOUS | Status: DC | PRN

## 2015-02-02 MED ORDER — LACTATED RINGERS IV SOLN
INTRAVENOUS | Status: DC
  Administered 2015-02-02: 16:00:00 via INTRAVENOUS

## 2015-02-02 MED ORDER — SUCCINYLCHOLINE CHLORIDE 20 MG/ML IJ SOLN
INTRAMUSCULAR | Status: AC
  Filled 2015-02-02: qty 1

## 2015-02-02 MED ORDER — DEXAMETHASONE SODIUM PHOSPHATE 4 MG/ML IJ SOLN
INTRAMUSCULAR | Status: DC | PRN
  Administered 2015-02-02: 4 mg via INTRAVENOUS

## 2015-02-02 MED ORDER — LIDOCAINE HCL (CARDIAC) 20 MG/ML IV SOLN
INTRAVENOUS | Status: DC | PRN
  Administered 2015-02-02: 60 mg via INTRAVENOUS

## 2015-02-02 MED ORDER — PROPOFOL 10 MG/ML IV BOLUS
INTRAVENOUS | Status: DC | PRN
  Administered 2015-02-02 (×2): 30 mg via INTRAVENOUS
  Administered 2015-02-02: 200 mg via INTRAVENOUS

## 2015-02-02 MED ORDER — SULFAMETHOXAZOLE-TRIMETHOPRIM 800-160 MG PO TABS: 1.0000 | ORAL_TABLET | Freq: Two times a day (BID) | ORAL | Status: DC

## 2015-02-02 SURGICAL SUPPLY — 43 items
BAG DECANTER FOR FLEXI CONT (MISCELLANEOUS) ×6 IMPLANT
CANISTER SUCT 3000ML PPV (MISCELLANEOUS) ×3 IMPLANT
CONT SPEC 4OZ CLIKSEAL STRL BL (MISCELLANEOUS) IMPLANT
DERMABOND ADHESIVE PROPEN (GAUZE/BANDAGES/DRESSINGS) ×2
DERMABOND ADVANCED .7 DNX6 (GAUZE/BANDAGES/DRESSINGS) ×1 IMPLANT
DRAPE LAPAROTOMY 100X72X124 (DRAPES) ×3 IMPLANT
DRAPE POUCH INSTRU U-SHP 10X18 (DRAPES) ×3 IMPLANT
DRSG OPSITE POSTOP 3X4 (GAUZE/BANDAGES/DRESSINGS) IMPLANT
DRSG OPSITE POSTOP 4X6 (GAUZE/BANDAGES/DRESSINGS) ×3 IMPLANT
DURAPREP 26ML APPLICATOR (WOUND CARE) ×3 IMPLANT
ELECT REM PT RETURN 9FT ADLT (ELECTROSURGICAL) ×3
ELECTRODE REM PT RTRN 9FT ADLT (ELECTROSURGICAL) ×1 IMPLANT
GAUZE SPONGE 4X4 12PLY STRL (GAUZE/BANDAGES/DRESSINGS) IMPLANT
GAUZE SPONGE 4X4 16PLY XRAY LF (GAUZE/BANDAGES/DRESSINGS) IMPLANT
GLOVE BIOGEL PI IND STRL 7.5 (GLOVE) ×1 IMPLANT
GLOVE BIOGEL PI INDICATOR 7.5 (GLOVE) ×2
GLOVE ECLIPSE 7.0 STRL STRAW (GLOVE) ×3 IMPLANT
GLOVE EXAM NITRILE LRG STRL (GLOVE) IMPLANT
GLOVE EXAM NITRILE MD LF STRL (GLOVE) IMPLANT
GLOVE EXAM NITRILE XL STR (GLOVE) IMPLANT
GLOVE EXAM NITRILE XS STR PU (GLOVE) IMPLANT
GOWN STRL REUS W/ TWL LRG LVL3 (GOWN DISPOSABLE) ×1 IMPLANT
GOWN STRL REUS W/ TWL XL LVL3 (GOWN DISPOSABLE) ×1 IMPLANT
GOWN STRL REUS W/TWL 2XL LVL3 (GOWN DISPOSABLE) IMPLANT
GOWN STRL REUS W/TWL LRG LVL3 (GOWN DISPOSABLE) ×2
GOWN STRL REUS W/TWL XL LVL3 (GOWN DISPOSABLE) ×2
KIT BASIN OR (CUSTOM PROCEDURE TRAY) ×3 IMPLANT
KIT REMOVER STAPLE SKIN (MISCELLANEOUS) ×3 IMPLANT
KIT ROOM TURNOVER OR (KITS) ×3 IMPLANT
NEEDLE HYPO 22GX1.5 SAFETY (NEEDLE) ×3 IMPLANT
NS IRRIG 1000ML POUR BTL (IV SOLUTION) ×3 IMPLANT
PACK LAMINECTOMY NEURO (CUSTOM PROCEDURE TRAY) ×3 IMPLANT
PAD ARMBOARD 7.5X6 YLW CONV (MISCELLANEOUS) ×3 IMPLANT
SPONGE SURGIFOAM ABS GEL SZ50 (HEMOSTASIS) ×3 IMPLANT
SUT VIC AB 0 CT1 18XCR BRD8 (SUTURE) ×2 IMPLANT
SUT VIC AB 0 CT1 8-18 (SUTURE) ×4
SUT VICRYL 3-0 RB1 18 ABS (SUTURE) ×6 IMPLANT
SWAB COLLECTION DEVICE MRSA (MISCELLANEOUS) ×3 IMPLANT
SYR 20ML ECCENTRIC (SYRINGE) IMPLANT
TOWEL OR 17X24 6PK STRL BLUE (TOWEL DISPOSABLE) ×3 IMPLANT
TOWEL OR 17X26 10 PK STRL BLUE (TOWEL DISPOSABLE) ×3 IMPLANT
TUBE ANAEROBIC SPECIMEN COL (MISCELLANEOUS) ×3 IMPLANT
WATER STERILE IRR 1000ML POUR (IV SOLUTION) ×3 IMPLANT

## 2015-02-02 NOTE — H&P (Signed)
CC:  Wound drainage  HPI: [name] is a 39 y.o. female who underwent lumbar laminectomy about 2 weeks ago, but has had persistent drainage from her wound despite outpatient oral antibiotic therapy. She therefore presents for elective wound debridement, washout, and closure.  PMH: Past Medical History  Diagnosis Date  . Depression   . Anxiety   . Migraines   . Carpal tunnel syndrome of right wrist 11/2013  . Hypertension     is on 3 meds. - states BP is under control; has been on med. x 12 yr.  . Ganglion cyst of wrist 11/2013    right  . Right tennis elbow 11/2013  . Cancer     ovarian cancer    PSH: Past Surgical History  Procedure Laterality Date  . Cholecystectomy  02/11/2004    lap. chole.  . Cesarean section  03/11/2005; 04/05/2008  . Tubal ligation  04/05/2008  . Abdominal hysterectomy      complete  . Carpal tunnel release Right 12/12/2013    Procedure: ENDOSCOPY RIGHT WRIST CARPAL TUNNEL RELEASE EXCISION GANGLION WRIST PRIMARY ;  Surgeon: Renette Butters, MD;  Location: [facility];  Service: Orthopedics;  Laterality: Right;  . Lateral epicondyle release Right 12/12/2013    Procedure: RIGHT TENNIS ELBOW RELEASE WITH BONE REPAIR;  Surgeon: Renette Butters, MD;  Location: [facility];  Service: Orthopedics;  Laterality: Right;  . Lumbar laminectomy with coflex 1 level Bilateral 01/14/2015    Procedure: Laminectomy - bilateral - L4-L5 ;  Surgeon: Karie Chimera, MD;  Location: [facility];  Service: Neurosurgery;  Laterality: Bilateral;  Laminectomy - bilateral - L4-L5     SH: History  Substance Use Topics  . Smoking status: Never Smoker   . Smokeless tobacco: Never Used  . Alcohol Use: No    MEDS: Prior to Admission medications   Medication Sig Start Date End Date Taking? Authorizing Provider  amLODipine (NORVASC) 5 MG tablet Take 5 mg by mouth daily.   Yes Historical Provider, MD  BIOTIN PO Take 1 tablet by mouth daily.   Yes  Historical Provider, MD  cephALEXin (KEFLEX) 500 MG capsule Take 500 mg by mouth 4 (four) times daily.   Yes Historical Provider, MD  cholecalciferol (VITAMIN D) 1000 UNITS tablet Take 1,000 Units by mouth daily.   Yes Historical Provider, MD  estradiol (ESTRACE) 2 MG tablet Take 2 mg by mouth daily.   Yes Historical Provider, MD  hydrochlorothiazide (HYDRODIURIL) 25 MG tablet Take 25 mg by mouth every morning.    Yes Historical Provider, MD  losartan (COZAAR) 100 MG tablet Take 100 mg by mouth daily.   Yes Historical Provider, MD  naproxen sodium (ALEVE) 220 MG tablet Take 660 mg by mouth 2 (two) times daily as needed (pain).   Yes Historical Provider, MD  Oxycodone HCl 10 MG TABS Take 1 tablet by mouth every 4 (four) hours. 01/26/15  Yes Historical Provider, MD    ALLERGY: No Known Allergies  ROS: ROS  NEUROLOGIC EXAM: Awake, alert, oriented Memory and concentration grossly intact Speech fluent, appropriate CN grossly intact Motor exam: Upper Extremities Deltoid Bicep Tricep Grip  Right 5/5 5/5 5/5 5/5  Left 5/5 5/5 5/5 5/5   Lower Extremity IP Quad PF DF EHL  Right 5/5 5/5 5/5 5/5 5/5  Left 5/5 5/5 5/5 5/5 5/5   Sensation grossly intact to LT   IMPRESSION: - 39 y.o. female 2 weeks s/p laminectomy with wound dehiscence  and persistent drainage.   PLAN: - Proceed with wound debridement, washout, and closure  I have reviewed treatment options including continued conservative treatment vs surgical washout/closure. Risks and benefits were reviewed in the office. After all questions were answered, she elected to proceed with surgery.

## 2015-02-02 NOTE — Progress Notes (Signed)
tramsported to main pacu

## 2015-02-02 NOTE — Op Note (Signed)
PREOP DIAGNOSIS:  1. Lumbar wound dehiscence, infection   POSTOP DIAGNOSIS: Same  PROCEDURE: 1. Debridement, washout, and closure of lumbar wound  SURGEON: Dr. Consuella Lose, MD  ASSISTANT: None  ANESTHESIA: General Endotracheal  EBL: 50cc  SPECIMENS: Wound culture  DRAINS: None  COMPLICATIONS: None immediate  CONDITION: Hemodynamically stable to PACU  HISTORY: [name] is a 39 y.o. female who previously underwent lumbar laminectomy about 2 weeks ago. The patient has had persistent purulent drainage from her wound, with partial dehiscence of the wound. She was placed on ora outpatient anti-biotics but had continued wound drainage. She therefore elected to proceed with surgical washout, debridement, and reclosure of the wound.   PROCEDURE IN DETAIL: After informed consent was obtained and witnessed, the patient was brought to the operating room. After induction of general anesthesia, the patient was positioned on the operative table in the proneon. All pressure points were meticulously padded. Previous skin incision was identified, prepped, and draped in usual sterile fashion.   After timeout was conducted, the previous woun was noted to be dehiscent in the inferior aspect, and the remainder of the wound was opened by cutting subcutaneous stitches. There was only minimal fluid collection in the suprafascial space. The fascial stitches were then cut, and there was a pocket of purulent fluid identified underneath the fascia. Cultures of this fluid was taken and sent for microbiology. The fluid collection was then evacuated, and the wound irrigated with copious amounts of anabolic irrigation. The fascial and muscular edges were debrided of any devitalized tissue using Metzenbaum scissors. The wound was again irrigated with copious amounts and a biotic irrigation.    The wound was then closed in layers using a combination of interrupted 0 and 3-0 Vicryl stitches in the  muscular, fascial, and subcutaneous layers. The skin was closed with Dermabond. Sterile dressing was then applied. The patient was then transferred to the stretcher, activated, and taken to the postanesthesia care unit in stable hemodynamic condition.  At the end of the case all sponge, needle, and instrument counts were correct.

## 2015-02-02 NOTE — Transfer of Care (Signed)
Immediate Anesthesia Transfer of Care Note  Patient: [name]  Procedure(s) Performed: Procedure(s) with comments: LUMBAR WOUND DEBRIDEMENT, WASHOUT, CLOSURE (N/A) - lumbar wound debridement  Patient Location: PACU  Anesthesia Type:General  Level of Consciousness: awake, alert , oriented and patient cooperative  Airway & Oxygen Therapy: Patient Spontanous Breathing and Patient connected to nasal cannula oxygen  Post-op Assessment: Report given to RN, Post -op Vital signs reviewed and stable and Patient moving all extremities X 4  Post vital signs: Reviewed and stable  Last Vitals:  Filed Vitals:   02/02/15 1433  BP: 153/74  Pulse: 70  Temp: 36.9 C  Resp: 18    Complications: No apparent anesthesia complications

## 2015-02-02 NOTE — Progress Notes (Signed)
Message left with Dr Kathyrn Sheriff with need to sign orders

## 2015-02-02 NOTE — Discharge Summary (Signed)
  Physician Discharge Summary  Patient ID: [name] MRN: [id number] DOB/AGE: 1976-04-19 39 y.o.  Admit date: 02/02/2015 Discharge date: 02/02/2015  Admission Diagnoses: Lumbar wound infection  Discharge Diagnoses: Same  Discharged Condition: Stable  Hospital Course:  Mrs. [name] is a 39 y.o. female who underwent elective wound debridement, washout, and closure. Procedure was undertaken without competition. She was at baseline postoperatively, and was therefore discharged home.  Treatments: Surgery - lumbar wound debridement, washout, and reclosure  Discharge Exam: Blood pressure 153/74, pulse 70, temperature 98.4 F (36.9 C), temperature source Oral, resp. rate 18, height 5\' 1"  (1.549 m), weight 100.699 kg (222 lb), last menstrual period 09/17/2010, SpO2 98 %. Awake, alert, oriented Speech fluent, appropriate CN grossly intact 5/5 BUE/BLE Wound c/d/i  Follow-up: Follow-up in Dr. Sande Rives office [facility] [phone]) in 1 week  Disposition: 01-Home or Self Care     Medication List    STOP taking these medications        cephALEXin 500 MG capsule  Commonly known as:  KEFLEX      TAKE these medications        ALEVE 220 MG tablet  Generic drug:  naproxen sodium  Take 660 mg by mouth 2 (two) times daily as needed (pain).     amLODipine 5 MG tablet  Commonly known as:  NORVASC  Take 5 mg by mouth daily.     BIOTIN PO  Take 1 tablet by mouth daily.     cholecalciferol 1000 UNITS tablet  Commonly known as:  VITAMIN D  Take 1,000 Units by mouth daily.     estradiol 2 MG tablet  Commonly known as:  ESTRACE  Take 2 mg by mouth daily.     hydrochlorothiazide 25 MG tablet  Commonly known as:  HYDRODIURIL  Take 25 mg by mouth every morning.     losartan 100 MG tablet  Commonly known as:  COZAAR  Take 100 mg by mouth daily.     Oxycodone HCl 10 MG Tabs  Take 1 tablet (10 mg total) by mouth every 4 (four) hours.     sulfamethoxazole-trimethoprim 800-160 MG per tablet  Commonly known as:  BACTRIM DS,SEPTRA DS  Take 1 tablet by mouth 2 (two) times daily.         SignedJairo Ben 02/02/2015, 5:02 PM

## 2015-02-02 NOTE — Anesthesia Procedure Notes (Signed)
Procedure Name: Intubation Date/Time: 02/02/2015 3:53 PM Performed by: Willeen Cass P Pre-anesthesia Checklist: Patient identified, Timeout performed, Emergency Drugs available, Suction available and Patient being monitored Patient Re-evaluated:Patient Re-evaluated prior to inductionOxygen Delivery Method: Circle system utilized Preoxygenation: Pre-oxygenation with 100% oxygen Intubation Type: IV induction Ventilation: Mask ventilation without difficulty Laryngoscope Size: Mac and 3 Grade View: Grade I Tube type: Oral Tube size: 7.0 mm Number of attempts: 1 Airway Equipment and Method: Stylet Placement Confirmation: ETT inserted through vocal cords under direct vision,  breath sounds checked- equal and bilateral and positive ETCO2 Secured at: 21 cm Tube secured with: Tape Dental Injury: Teeth and Oropharynx as per pre-operative assessment

## 2015-02-02 NOTE — Anesthesia Preprocedure Evaluation (Addendum)
Anesthesia Evaluation  Patient identified by MRN, date of birth, ID band Patient awake    Reviewed: Allergy & Precautions, NPO status , Patient's Chart, lab work & pertinent test results  Airway Mallampati: I  TM Distance: >3 FB Neck ROM: Full    Dental  (+) Teeth Intact, Dental Advisory Given   Pulmonary  breath sounds clear to auscultation  Pulmonary exam normal       Cardiovascular hypertension, Normal cardiovascular exam+ dysrhythmias Rhythm:Regular Rate:Normal     Neuro/Psych  Headaches, Anxiety  Neuromuscular disease    GI/Hepatic   Endo/Other    Renal/GU      Musculoskeletal  (+) Arthritis -,   Abdominal   Peds  Hematology   Anesthesia Other Findings   Reproductive/Obstetrics                            Anesthesia Physical Anesthesia Plan  ASA: II  Anesthesia Plan: General   Post-op Pain Management:    Induction: Intravenous  Airway Management Planned: Oral ETT  Additional Equipment:   Intra-op Plan:   Post-operative Plan: Extubation in OR  Informed Consent: I have reviewed the patients History and Physical, chart, labs and discussed the procedure including the risks, benefits and alternatives for the proposed anesthesia with the patient or authorized representative who has indicated his/her understanding and acceptance.     Plan Discussed with: CRNA, Anesthesiologist and Surgeon  Anesthesia Plan Comments:         Anesthesia Quick Evaluation

## 2015-02-03 ENCOUNTER — Encounter (HOSPITAL_COMMUNITY): Payer: Self-pay | Admitting: Neurosurgery

## 2015-02-03 NOTE — Anesthesia Postprocedure Evaluation (Signed)
  Anesthesia Post-op Note  Patient: [name]  Procedure(s) Performed: Procedure(s) with comments: LUMBAR WOUND DEBRIDEMENT, WASHOUT, CLOSURE (N/A) - lumbar wound debridement  Patient Location: PACU  Anesthesia Type:General  Level of Consciousness: awake, alert , oriented and patient cooperative  Airway and Oxygen Therapy: Patient Spontanous Breathing  Post-op Pain: mild  Post-op Assessment: Post-op Vital signs reviewed, Patient's Cardiovascular Status Stable, Respiratory Function Stable, Patent Airway, No signs of Nausea or vomiting and Pain level controlled              Post-op Vital Signs: stable  Last Vitals:  Filed Vitals:   02/02/15 1845  BP:   Pulse: 79  Temp: 36.6 C  Resp: 28    Complications: No apparent anesthesia complications

## 2015-02-05 LAB — WOUND CULTURE

## 2015-02-07 LAB — ANAEROBIC CULTURE

## 2015-07-12 ENCOUNTER — Encounter (HOSPITAL_COMMUNITY): Payer: Self-pay | Admitting: Emergency Medicine

## 2015-07-12 ENCOUNTER — Emergency Department (INDEPENDENT_AMBULATORY_CARE_PROVIDER_SITE_OTHER)
Admission: EM | Admit: 2015-07-12 | Discharge: 2015-07-12 | Disposition: A | Discharge status: Home / Self Care | Payer: Medicaid Other | Source: Home / Self Care | Attending: Family Medicine | Admitting: Family Medicine

## 2015-07-12 DIAGNOSIS — J029 Acute pharyngitis, unspecified: Secondary | ICD-10-CM | POA: Diagnosis not present

## 2015-07-12 NOTE — ED Provider Notes (Signed)
CSN: [id number]     Arrival date & time 07/12/15  1943 History   First MD Initiated Contact with Patient 07/12/15 2007     Chief Complaint  Patient presents with  . Sore Throat   (Consider location/radiation/quality/duration/timing/severity/associated sxs/prior Treatment) HPI Comments: 39 year old female complaining of a sore throat for either 2 weeks or 3 weeks based on her history. She was seen approximately 8 days ago by her PCP and treated with azithromycin. She has finished the course. She is complaining of persistent sore throat and cough. Denies fever or chills.   Past Medical History  Diagnosis Date  . Depression   . Anxiety   . Migraines   . Carpal tunnel syndrome of right wrist 11/2013  . Hypertension     is on 3 meds. - states BP is under control; has been on med. x 12 yr.  . Ganglion cyst of wrist 11/2013    right  . Right tennis elbow 11/2013  . Cancer [facility])     ovarian cancer   Past Surgical History  Procedure Laterality Date  . Cholecystectomy  02/11/2004    lap. chole.  . Cesarean section  03/11/2005; 04/05/2008  . Tubal ligation  04/05/2008  . Abdominal hysterectomy      complete  . Carpal tunnel release Right 12/12/2013    Procedure: ENDOSCOPY RIGHT WRIST CARPAL TUNNEL RELEASE EXCISION GANGLION WRIST PRIMARY ;  Surgeon: Renette Butters, MD;  Location: [place];  Service: Orthopedics;  Laterality: Right;  . Lateral epicondyle release Right 12/12/2013    Procedure: RIGHT TENNIS ELBOW RELEASE WITH BONE REPAIR;  Surgeon: Renette Butters, MD;  Location: [place];  Service: Orthopedics;  Laterality: Right;  . Lumbar laminectomy with coflex 1 level Bilateral 01/14/2015    Procedure: Laminectomy - bilateral - L4-L5 ;  Surgeon: Karie Chimera, MD;  Location: [place] [facility];  Service: Neurosurgery;  Laterality: Bilateral;  Laminectomy - bilateral - L4-L5   . Lumbar wound debridement N/A 02/02/2015    Procedure: LUMBAR WOUND DEBRIDEMENT, WASHOUT,  CLOSURE;  Surgeon: Consuella Lose, MD;  Location: [place] [facility];  Service: Neurosurgery;  Laterality: N/A;  lumbar wound debridement   No family history on file. Social History  Substance Use Topics  . Smoking status: Never Smoker   . Smokeless tobacco: Never Used  . Alcohol Use: No   OB History    No data available     Review of Systems  Constitutional: Negative.   HENT: Positive for sore throat. Negative for congestion, postnasal drip and sinus pressure.   Respiratory: Negative.   Cardiovascular: Negative.   Neurological: Negative.     Allergies  Review of patient's allergies indicates no known allergies.  Home Medications   Prior to Admission medications   Medication Sig Start Date End Date Taking? Authorizing Provider  amLODipine (NORVASC) 5 MG tablet Take 5 mg by mouth daily.    Historical Provider, MD  BIOTIN PO Take 1 tablet by mouth daily.    Historical Provider, MD  cholecalciferol (VITAMIN D) 1000 UNITS tablet Take 1,000 Units by mouth daily.    Historical Provider, MD  estradiol (ESTRACE) 2 MG tablet Take 2 mg by mouth daily.    Historical Provider, MD  hydrochlorothiazide (HYDRODIURIL) 25 MG tablet Take 25 mg by mouth every morning.     Historical Provider, MD  losartan (COZAAR) 100 MG tablet Take 100 mg by mouth daily.    Historical Provider, MD  naproxen sodium (ALEVE) 220 MG  tablet Take 660 mg by mouth 2 (two) times daily as needed (pain).    Historical Provider, MD  Oxycodone HCl 10 MG TABS Take 1 tablet (10 mg total) by mouth every 4 (four) hours. 02/02/15   Consuella Lose, MD  sulfamethoxazole-trimethoprim (BACTRIM DS,SEPTRA DS) 800-160 MG per tablet Take 1 tablet by mouth 2 (two) times daily. 02/02/15   Consuella Lose, MD   Meds Ordered and Administered this Visit  Medications - No data to display  BP 140/104 mmHg  Pulse 76  Temp(Src) 98.6 F (37 C) (Oral)  Resp 20  SpO2 98%  LMP 09/17/2010 No data found.   Physical Exam  Constitutional:  She is oriented to person, place, and time. She appears well-developed and well-nourished. No distress.  HENT:  Bilateral TMs are normal Oropharynx with minor erythema and clear PND. No exudates.  Eyes: EOM are normal.  Neck: Normal range of motion. Neck supple.  Cardiovascular: Normal rate and normal heart sounds.   Pulmonary/Chest: Effort normal. No respiratory distress.  Musculoskeletal: She exhibits no edema.  Lymphadenopathy:    She has no cervical adenopathy.  Neurological: She is alert and oriented to person, place, and time. She exhibits normal muscle tone.  Skin: Skin is warm and dry.  Psychiatric: She has a normal mood and affect.  Nursing note and vitals reviewed.   ED Course  Procedures (including critical care time)  Labs Review Labs Reviewed - No data to display  Imaging Review No results found.   Visual Acuity Review  Right Eye Distance:   Left Eye Distance:   Bilateral Distance:    Right Eye Near:   Left Eye Near:    Bilateral Near:         MDM   1. Sore throat    Ibuprofen 600 mg every 6-8h as neeeded Cepacol lozenges Take Claritin or Allegra or Zyrtec for drainage.    Janne Napoleon, NP 07/12/15 2023

## 2015-07-12 NOTE — Discharge Instructions (Signed)
Sore Throat A sore throat is pain, burning, irritation, or scratchiness of the throat. There is often pain or tenderness when swallowing or talking. A sore throat may be accompanied by other symptoms, such as coughing, sneezing, fever, and swollen neck glands. A sore throat is often the first sign of another sickness, such as a cold, flu, strep throat, or mononucleosis (commonly known as mono). Most sore throats go away without medical treatment. CAUSES  The most common causes of a sore throat include:  A viral infection, such as a cold, flu, or mono.  A bacterial infection, such as strep throat, tonsillitis, or whooping cough.  Seasonal allergies.  Dryness in the air.  Irritants, such as smoke or pollution.  Gastroesophageal reflux disease (GERD). HOME CARE INSTRUCTIONS  Ibuprofen 600 mg every 6-8h as neeeded Cepacol lozenges Take Claritin or Allegra or Zyrtec for drainage.  Only take over-the-counter medicines as directed by your caregiver.  Drink enough fluids to keep your urine clear or pale yellow.  Rest as needed.  Try using throat sprays, lozenges, or sucking on hard candy to ease any pain (if older than 4 years or as directed).  Sip warm liquids, such as broth, herbal tea, or warm water with honey to relieve pain temporarily. You may also eat or drink cold or frozen liquids such as frozen ice pops.  Gargle with salt water (mix 1 tsp salt with 8 oz of water).  Do not smoke and avoid secondhand smoke.  Put a cool-mist humidifier in your bedroom at night to moisten the air. You can also turn on a hot shower and sit in the bathroom with the door closed for 5-10 minutes. SEEK IMMEDIATE MEDICAL CARE IF:  You have difficulty breathing.  You are unable to swallow fluids, soft foods, or your saliva.  You have increased swelling in the throat.  Your sore throat does not get better in 7 days.  You have nausea and vomiting.  You have a fever or persistent symptoms for more  than 2-3 days.  You have a fever and your symptoms suddenly get worse. MAKE SURE YOU:   Understand these instructions.  Will watch your condition.  Will get help right away if you are not doing well or get worse.   This information is not intended to replace advice given to you by your health care provider. Make sure you discuss any questions you have with your health care provider.   Document Released: 08/17/2004 Document Revised: 07/31/2014 Document Reviewed: 03/17/2012 Elsevier Interactive Patient Education [employer].

## 2015-07-12 NOTE — ED Notes (Signed)
Patient has had a sore throat for 2 weeks.  Patient reports she was told in that time frame that she had strep and was treated with z pack that she finished 2 days ago.  Throat continues to be sore and left ear hurts, and pain left side of neck.

## 2015-08-10 DIAGNOSIS — F5101 Primary insomnia: Secondary | ICD-10-CM | POA: Insufficient documentation

## 2015-08-10 DIAGNOSIS — K219 Gastro-esophageal reflux disease without esophagitis: Secondary | ICD-10-CM | POA: Insufficient documentation

## 2015-08-10 DIAGNOSIS — G43009 Migraine without aura, not intractable, without status migrainosus: Secondary | ICD-10-CM | POA: Insufficient documentation

## 2015-08-10 DIAGNOSIS — E6609 Other obesity due to excess calories: Secondary | ICD-10-CM | POA: Insufficient documentation

## 2015-08-10 DIAGNOSIS — C569 Malignant neoplasm of unspecified ovary: Secondary | ICD-10-CM | POA: Insufficient documentation

## 2016-10-22 ENCOUNTER — Ambulatory Visit (HOSPITAL_COMMUNITY)
Admission: EM | Admit: 2016-10-22 | Discharge: 2016-10-22 | Disposition: A | Discharge status: Home / Self Care | Payer: Medicaid Other | Attending: Family Medicine | Admitting: Family Medicine

## 2016-10-22 ENCOUNTER — Encounter (HOSPITAL_COMMUNITY): Payer: Self-pay | Admitting: Emergency Medicine

## 2016-10-22 DIAGNOSIS — Z79899 Other long term (current) drug therapy: Secondary | ICD-10-CM | POA: Insufficient documentation

## 2016-10-22 DIAGNOSIS — J069 Acute upper respiratory infection, unspecified: Secondary | ICD-10-CM | POA: Insufficient documentation

## 2016-10-22 DIAGNOSIS — B9789 Other viral agents as the cause of diseases classified elsewhere: Secondary | ICD-10-CM

## 2016-10-22 DIAGNOSIS — R05 Cough: Secondary | ICD-10-CM | POA: Diagnosis present

## 2016-10-22 LAB — POCT RAPID STREP A: Streptococcus, Group A Screen (Direct): NEGATIVE

## 2016-10-22 MED ORDER — IPRATROPIUM BROMIDE 0.06 % NA SOLN: 2.0000 | Freq: Four times a day (QID) | NASAL | 0 refills | Status: DC

## 2016-10-22 MED ORDER — PSEUDOEPH-BROMPHEN-DM 30-2-10 MG/5ML PO SYRP: 5.0000 mL | ORAL_SOLUTION | Freq: Four times a day (QID) | ORAL | 0 refills | Status: DC | PRN

## 2016-10-22 NOTE — ED Triage Notes (Signed)
[facility] a [facility] a cough x 1 week.

## 2016-10-22 NOTE — ED Provider Notes (Signed)
CSN: [id number]     Arrival date & time 10/22/16  1228 History   None    Chief Complaint  Patient presents with  . Cough   (Consider location/radiation/quality/duration/timing/severity/associated sxs/prior Treatment) Patient c/o cough and uri sx's for last week.   The history is provided by the patient.  Cough  Cough characteristics:  Productive Sputum characteristics:  White Severity:  Moderate Onset quality:  Sudden Duration:  1 week Timing:  Constant Chronicity:  New Smoker: no   Relieved by:  Nothing Worsened by:  Nothing Ineffective treatments:  None tried   Past Medical History:  Diagnosis Date  . Anxiety   . Cancer ([place])    ovarian cancer  . Carpal tunnel syndrome of right wrist 11/2013  . Depression   . Ganglion cyst of wrist 11/2013   right  . Hypertension    is on 3 meds. - states BP is under control; has been on med. x 12 yr.  . Migraines   . Right tennis elbow 11/2013   Past Surgical History:  Procedure Laterality Date  . ABDOMINAL HYSTERECTOMY     complete  . CARPAL TUNNEL RELEASE Right 12/12/2013   Procedure: ENDOSCOPY RIGHT WRIST CARPAL TUNNEL RELEASE EXCISION GANGLION WRIST PRIMARY ;  Surgeon: Renette Butters, MD;  Location: [place];  Service: Orthopedics;  Laterality: Right;  . CESAREAN SECTION  03/11/2005; 04/05/2008  . CHOLECYSTECTOMY  02/11/2004   lap. chole.  Marland Kitchen LATERAL EPICONDYLE RELEASE Right 12/12/2013   Procedure: RIGHT TENNIS ELBOW RELEASE WITH BONE REPAIR;  Surgeon: Renette Butters, MD;  Location: [place];  Service: Orthopedics;  Laterality: Right;  . LUMBAR LAMINECTOMY WITH COFLEX 1 LEVEL Bilateral 01/14/2015   Procedure: Laminectomy - bilateral - L4-L5 ;  Surgeon: Karie Chimera, MD;  Location: [place] [facility];  Service: Neurosurgery;  Laterality: Bilateral;  Laminectomy - bilateral - L4-L5   . LUMBAR WOUND DEBRIDEMENT N/A 02/02/2015   Procedure: LUMBAR WOUND DEBRIDEMENT, WASHOUT, CLOSURE;  Surgeon: Consuella Lose, MD;  Location: [place] [facility];  Service: Neurosurgery;  Laterality: N/A;  lumbar wound debridement  . TUBAL LIGATION  04/05/2008   History reviewed. No pertinent family history. Social History  Substance Use Topics  . Smoking status: Never Smoker  . Smokeless tobacco: Never Used  . Alcohol use No   OB History    No data available     Review of Systems  Constitutional: Negative.   HENT: Negative.   Eyes: Negative.   Respiratory: Positive for cough.   Cardiovascular: Negative.   Gastrointestinal: Negative.   Endocrine: Negative.   Genitourinary: Negative.   Musculoskeletal: Negative.   Allergic/Immunologic: Negative.   Neurological: Negative.   Hematological: Negative.   Psychiatric/Behavioral: Negative.     Allergies  Patient has no known allergies.  Home Medications   Prior to Admission medications   Medication Sig Start Date End Date Taking? Authorizing Provider  amLODipine (NORVASC) 5 MG tablet Take 5 mg by mouth daily.   Yes Historical Provider, MD  hydrochlorothiazide (HYDRODIURIL) 25 MG tablet Take 25 mg by mouth every morning.    Yes Historical Provider, MD  losartan (COZAAR) 100 MG tablet Take 100 mg by mouth daily.   Yes Historical Provider, MD  brompheniramine-pseudoephedrine-DM 30-2-10 MG/5ML syrup Take 5 mLs by mouth 4 (four) times daily as needed. 10/22/16   Lysbeth Penner, FNP  ipratropium (ATROVENT) 0.06 % nasal spray Place 2 sprays into both nostrils 4 (four) times daily. 10/22/16   Orson Ape  Aleene Swanner, FNP   Meds Ordered and Administered this Visit  Medications - No data to display  BP (!) 210/108 (BP Location: Left Arm) Comment: Nurse Preston Fleeting aware of the elevated BP  Pulse 88   Temp 98.6 F (37 C) (Oral)   Resp 18   LMP 09/17/2010   SpO2 99%  No data found.   Physical Exam  Constitutional: She is oriented to person, place, and time. She appears well-developed and well-nourished.  HENT:  Head: Normocephalic.  Right Ear:  External ear normal.  Left Ear: External ear normal.  Mouth/Throat: Oropharynx is clear and moist.  Eyes: Conjunctivae and EOM are normal. Pupils are equal, round, and reactive to light.  Neck: Normal range of motion. Neck supple.  Cardiovascular: Normal rate, regular rhythm and normal heart sounds.   Pulmonary/Chest: Effort normal and breath sounds normal.  Abdominal: Soft. Bowel sounds are normal.  Neurological: She is alert and oriented to person, place, and time.  Nursing note and vitals reviewed.   Urgent Care Course     Procedures (including critical care time)  Labs Review Labs Reviewed  POCT RAPID STREP A    Imaging Review No results found.   Visual Acuity Review  Right Eye Distance:   Left Eye Distance:   Bilateral Distance:    Right Eye Near:   Left Eye Near:    Bilateral Near:         MDM   1. Viral URI with cough    Atrovent nasal spray Bromfed  DM  Push po fluids, rest, tylenol and motrin otc prn as directed for fever, arthralgias, and myalgias.  Follow up prn if sx's continue or persist.    Lysbeth Penner, FNP 10/22/16 [phone]

## 2016-10-22 NOTE — ED Notes (Addendum)
PT reports she was coughing when BP was checked in triage. PT has been taking her BP meds as prescribed. Dietrich Pates NP approves discharge

## 2016-10-25 LAB — CULTURE, GROUP A STREP (THRC)

## 2017-08-30 DIAGNOSIS — F411 Generalized anxiety disorder: Secondary | ICD-10-CM | POA: Insufficient documentation

## 2017-08-30 DIAGNOSIS — E2839 Other primary ovarian failure: Secondary | ICD-10-CM | POA: Insufficient documentation

## 2017-08-30 DIAGNOSIS — F339 Major depressive disorder, recurrent, unspecified: Secondary | ICD-10-CM | POA: Insufficient documentation

## 2017-12-28 ENCOUNTER — Encounter (HOSPITAL_COMMUNITY): Payer: Self-pay

## 2017-12-28 ENCOUNTER — Other Ambulatory Visit: Payer: Self-pay

## 2017-12-28 ENCOUNTER — Emergency Department (HOSPITAL_COMMUNITY)
Admission: EM | Admit: 2017-12-28 | Discharge: 2017-12-28 | Disposition: A | Discharge status: Home / Self Care | Payer: Medicaid Other | Attending: Emergency Medicine | Admitting: Emergency Medicine

## 2017-12-28 DIAGNOSIS — Z79899 Other long term (current) drug therapy: Secondary | ICD-10-CM | POA: Diagnosis not present

## 2017-12-28 DIAGNOSIS — Z8543 Personal history of malignant neoplasm of ovary: Secondary | ICD-10-CM | POA: Insufficient documentation

## 2017-12-28 DIAGNOSIS — I480 Paroxysmal atrial fibrillation: Secondary | ICD-10-CM | POA: Insufficient documentation

## 2017-12-28 DIAGNOSIS — R519 Headache, unspecified: Secondary | ICD-10-CM

## 2017-12-28 DIAGNOSIS — I10 Essential (primary) hypertension: Secondary | ICD-10-CM | POA: Insufficient documentation

## 2017-12-28 DIAGNOSIS — R51 Headache: Secondary | ICD-10-CM | POA: Diagnosis present

## 2017-12-28 MED ORDER — SODIUM CHLORIDE 0.9 % IV BOLUS
1000.0000 mL | Freq: Once | INTRAVENOUS | Status: AC
  Administered 2017-12-28: 1000 mL via INTRAVENOUS

## 2017-12-28 MED ORDER — METOCLOPRAMIDE HCL 10 MG PO TABS
10.0000 mg | ORAL_TABLET | Freq: Once | ORAL | Status: AC
  Administered 2017-12-28: 10 mg via ORAL
  Filled 2017-12-28: qty 1

## 2017-12-28 MED ORDER — DIPHENHYDRAMINE HCL 50 MG/ML IJ SOLN
12.5000 mg | Freq: Once | INTRAMUSCULAR | Status: AC
  Administered 2017-12-28: 12.5 mg via INTRAVENOUS
  Filled 2017-12-28: qty 1

## 2017-12-28 MED ORDER — KETOROLAC TROMETHAMINE 15 MG/ML IJ SOLN
15.0000 mg | Freq: Once | INTRAMUSCULAR | Status: AC
  Administered 2017-12-28: 15 mg via INTRAVENOUS
  Filled 2017-12-28: qty 1

## 2017-12-28 NOTE — ED Notes (Signed)
ED Provider at bedside. 

## 2017-12-28 NOTE — ED Triage Notes (Signed)
Per EMS- Patient was at a regular appointment with PCP. BP 87/57 at physician's office.Patient reported that she took BP med prior to appointment EMS called. Patient's BP-148/98 with EMS. Patient also c/o migraine x 5 days. Intermittent nausea and sensitivity to light.  No dizziness or LOC.

## 2017-12-31 NOTE — ED Provider Notes (Signed)
[facility] DEPT Provider Note   CSN: [id number] Arrival date & time: 12/28/17  1011     History   Chief Complaint Chief Complaint  Patient presents with  . Hypotension  . Migraine    HPI [name] is a 42 y.o. female.  HPI   42yF sent from PCP for evaluation after hypotensive in office. Was following up after being seen previously for headaches. She took her BP medication about 30 minutes prior to arriving to the office. Denies any dizziness, lightheadedness, dyspnea, palpitations, etc. She does have a HA but this is more of a chronic problem for her. She denies acute change in character.   Past Medical History:  Diagnosis Date  . Anxiety   . Cancer ([place])    ovarian cancer  . Carpal tunnel syndrome of right wrist 11/2013  . Depression   . Ganglion cyst of wrist 11/2013   right  . Hypertension    is on 3 meds. - states BP is under control; has been on med. x 12 yr.  . Migraines   . Right tennis elbow 11/2013    Patient Active Problem List   Diagnosis Date Noted  . Spinal stenosis, lumbar 01/14/2015  . Carpal tunnel syndrome 12/12/2013  . HYPERLIPIDEMIA 12/05/2007  . HYPERTENSION 12/05/2007  . ATRIAL FIBRILLATION, PAROXYSMAL 12/05/2007  . BRADYCARDIA 12/05/2007  . DEGENERATIVE JOINT DISEASE 12/05/2007    Past Surgical History:  Procedure Laterality Date  . ABDOMINAL HYSTERECTOMY     complete  . CARPAL TUNNEL RELEASE Right 12/12/2013   Procedure: ENDOSCOPY RIGHT WRIST CARPAL TUNNEL RELEASE EXCISION GANGLION WRIST PRIMARY ;  Surgeon: Renette Butters, MD;  Location: [place];  Service: Orthopedics;  Laterality: Right;  . CESAREAN SECTION  03/11/2005; 04/05/2008  . CHOLECYSTECTOMY  02/11/2004   lap. chole.  Marland Kitchen LATERAL EPICONDYLE RELEASE Right 12/12/2013   Procedure: RIGHT TENNIS ELBOW RELEASE WITH BONE REPAIR;  Surgeon: Renette Butters, MD;  Location: [place];  Service: Orthopedics;  Laterality:  Right;  . LUMBAR LAMINECTOMY WITH COFLEX 1 LEVEL Bilateral 01/14/2015   Procedure: Laminectomy - bilateral - L4-L5 ;  Surgeon: Karie Chimera, MD;  Location: [place] [facility];  Service: Neurosurgery;  Laterality: Bilateral;  Laminectomy - bilateral - L4-L5   . LUMBAR WOUND DEBRIDEMENT N/A 02/02/2015   Procedure: LUMBAR WOUND DEBRIDEMENT, WASHOUT, CLOSURE;  Surgeon: Consuella Lose, MD;  Location: [place] [facility];  Service: Neurosurgery;  Laterality: N/A;  lumbar wound debridement  . TUBAL LIGATION  04/05/2008     OB History   None      Home Medications    Prior to Admission medications   Medication Sig Start Date End Date Taking? Authorizing Provider  amLODipine (NORVASC) 10 MG tablet Take 10 mg by mouth daily. 11/06/17  Yes [provider]  aspirin-acetaminophen-caffeine (EXCEDRIN MIGRAINE) [phone] MG tablet Take 2 tablets by mouth daily as needed for migraine.   Yes [provider]  hydrochlorothiazide (HYDRODIURIL) 25 MG tablet Take 25 mg by mouth every morning.    Yes [provider]  losartan (COZAAR) 100 MG tablet Take 100 mg by mouth daily.   Yes [provider]  omeprazole (PRILOSEC) 20 MG capsule Take 20 mg by mouth daily.   Yes [provider]  rizatriptan (MAXALT) 10 MG tablet TK 1 T PO  AT ONSET  AND  MAY REPEAT IN 2 HOURS IF NEEDED 11/06/17  Yes [provider]  brompheniramine-pseudoephedrine-DM 30-2-10 MG/5ML syrup Take  5 mLs by mouth 4 (four) times daily as needed. Patient not taking: Reported on 12/28/2017 10/22/16   Lysbeth Penner, FNP  ipratropium (ATROVENT) 0.06 % nasal spray Place 2 sprays into both nostrils 4 (four) times daily. Patient not taking: Reported on 12/28/2017 10/22/16   Lysbeth Penner, FNP    Family History No family history on file.  Social History Social History   Tobacco Use  . Smoking status: Never Smoker  . Smokeless tobacco: Never Used  Substance Use Topics  . Alcohol use: No  . Drug use: No       Allergies   Patient has no known allergies.   Review of Systems Review of Systems  All systems reviewed and negative, other than as noted in HPI.  Physical Exam Updated Vital Signs BP 116/78   Pulse 70   Temp 98 F (36.7 C) (Oral)   Resp 17   Ht 5\' 1"  (1.549 m)   Wt 108.9 kg (240 lb)   LMP 09/17/2010   SpO2 100%   BMI 45.35 kg/m   Physical Exam  Constitutional: She is oriented to person, place, and time. She appears well-developed and well-nourished. No distress.  HENT:  Head: Normocephalic and atraumatic.  Eyes: Pupils are equal, round, and reactive to light. Conjunctivae and EOM are normal. Right eye exhibits no discharge. Left eye exhibits no discharge.  Neck: Neck supple.  No nuchal rigidity  Cardiovascular: Normal rate, regular rhythm and normal heart sounds. Exam reveals no gallop and no friction rub.  No murmur heard. Pulmonary/Chest: Effort normal and breath sounds normal. No respiratory distress.  Abdominal: Soft. She exhibits no distension. There is no tenderness.  Musculoskeletal: She exhibits no edema or tenderness.  Neurological: She is alert and oriented to person, place, and time. She displays normal reflexes. No cranial nerve deficit. She exhibits normal muscle tone.  Skin: Skin is warm and dry.  Psychiatric: She has a normal mood and affect. Her behavior is normal. Thought content normal.  Nursing note and vitals reviewed.    ED Treatments / Results  Labs (all labs ordered are listed, but only abnormal results are displayed) Labs Reviewed - No data to display  EKG None  Radiology No results found.  Procedures Procedures (including critical care time)  Medications Ordered in ED Medications  metoCLOPramide (REGLAN) tablet 10 mg (10 mg Oral Given 12/28/17 1217)  sodium chloride 0.9 % bolus 1,000 mL (0 mLs Intravenous Stopped 12/28/17 1307)  diphenhydrAMINE (BENADRYL) injection 12.5 mg (12.5 mg Intravenous Given 12/28/17 1217)  ketorolac  (TORADOL) 15 MG/ML injection 15 mg (15 mg Intravenous Given 12/28/17 1217)     Initial Impression / Assessment and Plan / ED Course  I have reviewed the triage vital signs and the nursing notes.  Pertinent labs & imaging results that were available during my care of the patient were reviewed by me and considered in my medical decision making (see chart for details).     42yF with hypotension, now resolved. No acute complaints with regards to this. HA which is subacute to chronic. Nonfocal neuro exam. Treated with much improvement.   It has been determined that no acute conditions requiring further emergency intervention are present at this time. The patient has been advised of the diagnosis and plan. I reviewed any labs and imaging including any potential incidental findings. We have discussed signs and symptoms that warrant return to the ED and they are listed in the discharge instructions.    Final Clinical Impressions(s) /  ED Diagnoses   Final diagnoses:  Nonintractable headache, unspecified chronicity pattern, unspecified headache type    ED Discharge Orders    None       Virgel Manifold, MD 12/31/17 1120

## 2018-06-14 DIAGNOSIS — R7303 Prediabetes: Secondary | ICD-10-CM | POA: Insufficient documentation

## 2018-06-14 DIAGNOSIS — L83 Acanthosis nigricans: Secondary | ICD-10-CM | POA: Insufficient documentation

## 2020-03-30 ENCOUNTER — Ambulatory Visit: Payer: Medicaid Other | Attending: Internal Medicine

## 2020-03-30 DIAGNOSIS — Z23 Encounter for immunization: Secondary | ICD-10-CM

## 2020-03-30 NOTE — Progress Notes (Signed)
   Covid-19 Vaccination Clinic  Name:  [name]    MRN: [id number] DOB: 05-31-1976  03/30/2020  [name] was observed post Covid-19 immunization for 15 minutes without incident. She was provided with Vaccine Information Sheet and instruction to access the V-Safe system.   [name] was instructed to call 911 with any severe reactions post vaccine: Marland Kitchen Difficulty breathing  . Swelling of face and throat  . A fast heartbeat  . A bad rash all over body  . Dizziness and weakness

## 2020-06-10 DIAGNOSIS — I493 Ventricular premature depolarization: Secondary | ICD-10-CM | POA: Insufficient documentation

## 2020-06-10 DIAGNOSIS — I491 Atrial premature depolarization: Secondary | ICD-10-CM | POA: Insufficient documentation

## 2020-09-21 DIAGNOSIS — C801 Malignant (primary) neoplasm, unspecified: Secondary | ICD-10-CM | POA: Insufficient documentation

## 2021-04-11 DIAGNOSIS — R1900 Intra-abdominal and pelvic swelling, mass and lump, unspecified site: Secondary | ICD-10-CM

## 2021-04-11 HISTORY — DX: Intra-abdominal and pelvic swelling, mass and lump, unspecified site: R19.00

## 2021-06-23 ENCOUNTER — Encounter: Payer: Self-pay | Admitting: Nurse Practitioner

## 2021-06-23 ENCOUNTER — Other Ambulatory Visit: Payer: Self-pay | Admitting: Nurse Practitioner

## 2021-06-23 ENCOUNTER — Other Ambulatory Visit: Payer: Self-pay

## 2021-06-23 ENCOUNTER — Ambulatory Visit (INDEPENDENT_AMBULATORY_CARE_PROVIDER_SITE_OTHER): Payer: Medicaid Other | Admitting: Nurse Practitioner

## 2021-06-23 VITALS — BP 110/80 | HR 70 | Temp 98.5°F | Ht 60.2 in | Wt 249.0 lb

## 2021-06-23 DIAGNOSIS — F320 Major depressive disorder, single episode, mild: Secondary | ICD-10-CM

## 2021-06-23 DIAGNOSIS — I10 Essential (primary) hypertension: Secondary | ICD-10-CM

## 2021-06-23 DIAGNOSIS — Z7689 Persons encountering health services in other specified circumstances: Secondary | ICD-10-CM

## 2021-06-23 DIAGNOSIS — Z6841 Body Mass Index (BMI) 40.0 and over, adult: Secondary | ICD-10-CM

## 2021-06-23 DIAGNOSIS — E559 Vitamin D deficiency, unspecified: Secondary | ICD-10-CM

## 2021-06-23 DIAGNOSIS — R7303 Prediabetes: Secondary | ICD-10-CM

## 2021-06-23 NOTE — Progress Notes (Signed)
I,[name],acting as a Education administrator for [employer], NP.,have documented all relevant documentation on the behalf of [employer], NP,as directed by  Emily Castilla, NP while in the presence of Emily Castilla, NP.  This visit occurred during the SARS-CoV-2 public health emergency.  Safety protocols were in place, including screening questions prior to the visit, additional usage of staff PPE, and extensive cleaning of exam room while observing appropriate contact time as indicated for disinfecting solutions.  Subjective:     Patient ID: [name] , female    DOB: 1976/05/20 , 45 y.o.   MRN: [id number]   Chief Complaint  Patient presents with   Hypertension   Establish Care    HPI  Patient presents today to establish primary care. She last seen a previously seen a primary provider about 6 months ago. She use to go to [facility], she seen Dr.Farr. Patient reports she feels like her blood pressure has been running high a few days in the past month. She stated she has not been checking her blood pressure but can just feel when it is high she would get a headache.  She has a hysterectomy. She is taking hormones.  She is  also having a lot stress due to personal family issues at home.  Will check labs today because she prescribed ozempic at her last practice but couldn't get it.   Hypertension This is a chronic problem. The current episode started more than 1 year ago. Pertinent negatives include no chest pain, headaches, palpitations or shortness of breath.    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 - states BP is under control; has been on med. x 12 yr.   Migraines    Right tennis elbow 11/2013     Family History  Problem Relation Age of Onset   Hypertension  Mother    Diabetes Mother    Cancer Mother    Diabetes Brother    Hypertension Brother    Diabetes Brother    Hypertension Maternal Grandmother    Heart attack Paternal Grandmother    Cancer Other      Current Outpatient Medications:    amLODipine (NORVASC) 10 MG tablet, Take 10 mg by mouth daily., Disp: , Rfl: 5   aspirin-acetaminophen-caffeine (EXCEDRIN MIGRAINE) 250-250-65 MG tablet, Take 2 tablets by mouth daily as needed for migraine., Disp: , Rfl:    atenolol (TENORMIN) 25 MG tablet, Take 25 mg by mouth daily., Disp: , Rfl:    estradiol (ESTRACE) 2 MG tablet, Take 2 mg by mouth daily., Disp: , Rfl:    hydrochlorothiazide (HYDRODIURIL) 25 MG tablet, Take 25 mg by mouth every morning. , Disp: , Rfl:    losartan (COZAAR) 100 MG tablet, Take 100 mg by mouth daily., Disp: , Rfl:    omeprazole (PRILOSEC) 20 MG capsule, Take 20 mg by mouth daily., Disp: , Rfl:    rizatriptan (MAXALT) 10 MG tablet, TK 1 T PO  AT ONSET  AND  MAY REPEAT IN 2 HOURS IF NEEDED, Disp: , Rfl: 5   No Known Allergies   Review of Systems  Constitutional:  Negative for chills and fatigue.  Respiratory:  Negative for cough, shortness of breath and wheezing.  Cardiovascular:  Negative for chest pain and palpitations.  Neurological:  Negative for headaches.  Psychiatric/Behavioral:         Tearful     Today's Vitals   06/23/21 1041  BP: 110/80  Pulse: 70  Temp: 98.5 F (36.9 C)  SpO2: 96%  Weight: 249 lb (112.9 kg)  Height: 5' 0.2" (1.529 m)  PainSc: 0-No pain   Body mass index is 48.31 kg/m.  Wt Readings from Last 3 Encounters:  06/23/21 249 lb (112.9 kg)  12/28/17 240 lb (108.9 kg)  02/02/15 222 lb (100.7 kg)     Objective:  Physical Exam Constitutional:      Appearance: Normal appearance. She is obese.  HENT:     Head: Normocephalic and atraumatic.  Cardiovascular:     Rate and Rhythm: Normal rate and regular rhythm.     Pulses: Normal pulses.     Heart sounds: Normal heart sounds. No  murmur heard. Pulmonary:     Effort: No respiratory distress.     Breath sounds: Normal breath sounds. No wheezing.  Skin:    General: Skin is warm and dry.     Capillary Refill: Capillary refill takes less than 2 seconds.  Neurological:     Mental Status: She is alert and oriented to person, place, and time.  Psychiatric:        Mood and Affect: Mood normal.        Behavior: Behavior normal.       Assessment And Plan:     1. Establishing care with new doctor, encounter for --Patient is here to establish care. [name] over patient medical, family, social and surgical history. -Reviewed with patient their medications and any allergies  -Reviewed with patient their sexual orientation, drug/tobacco and alcohol use -Dicussed any new concerns with patient  -recommended patient comes in for a physical exam and complete blood work.  -Educated patient about the importance of annual screenings and immunizations.  -Advised patient to eat a healthy diet along with exercise for atleast 30-45 min atleast 4-5 days of the week.   2. Essential hypertension -Currently taking amlodipine, losartan and hydrochlorothiazide  -Stable, chronic,  -Reviewed labs   3. Prediabetes -Chornic, stable  -Will look at her level. She is interested in starting Manjuaro or ozempic.  - Hemoglobin A1c  4. Vitamin D deficiency -Will check and supplement if needed. Advised patient to spend atleast 15 min. Daily in sunlight.  - Vitamin D (25 hydroxy)  5. Current mild episode of major depressive disorder, unspecified whether recurrent ([place]) - Ambulatory referral to Psychology  6. Class 3 severe obesity due to excess calories with body mass index (BMI) of 45.0 to 49.9 in adult, unspecified whether serious comorbidity present [facility])  Advised patient on a healthy diet including avoiding fast food and red meats. Increase the intake of lean meats including grilled chicken and [place].  Drink a lot of water. Decrease intake  of fatty foods. Exercise for 30-45 min. 4-5 a week to decrease the risk of cardiac event.   The patient was encouraged to call or send a message through [place] for any questions or concerns.   Follow up: if symptoms persist or do not get better.   Side effects and appropriate use of all the medication(s) were discussed with the patient today. Patient advised to use the medication(s) as directed by their healthcare provider. The patient was encouraged to read, review, and understand all associated package inserts and contact our office with any questions or  concerns. The patient accepts the risks of the treatment plan and had an opportunity to ask questions.   Staying healthy and adopting a healthy lifestyle for your overall health is important. You should eat 7 or more servings of fruits and vegetables per day. You should drink plenty of water to keep yourself hydrated and your kidneys healthy. This includes about 65-80+ fluid ounces of water. Limit your intake of animal fats especially for elevated cholesterol. Avoid highly processed food and limit your salt intake if you have hypertension. Avoid foods high in saturated/Trans fats. Along with a healthy diet it is also very important to maintain time for yourself to maintain a healthy mental health with low stress levels. You should get atleast 150 min of moderate intensity exercise weekly for a healthy heart. Along with eating right and exercising, aim for at least 7-9 hours of sleep daily.  Eat more whole grains which includes barley, wheat berries, oats, brown rice and whole wheat pasta. Use healthy plant oils which include olive, soy, corn, sunflower and peanut. Limit your caffeine and sugary drinks. Limit your intake of fast foods. Limit milk and dairy products to one or two daily servings.   Patient was given opportunity to ask questions. Patient verbalized understanding of the plan and was able to repeat key elements of the plan. All questions were  answered to their satisfaction.  Emily Chrisann Melaragno, DNP   I, [name] have reviewed all documentation for this visit. The documentation on 06/23/21 for the exam, diagnosis, procedures, and orders are all accurate and complete.   IF YOU HAVE BEEN REFERRED TO A SPECIALIST, IT MAY TAKE 1-2 WEEKS TO SCHEDULE/PROCESS THE REFERRAL. IF YOU HAVE NOT HEARD FROM US/SPECIALIST IN TWO WEEKS, PLEASE GIVE [place] A CALL AT [phone].   THE PATIENT IS ENCOURAGED TO PRACTICE SOCIAL DISTANCING DUE TO THE COVID-19 PANDEMIC.

## 2021-06-23 NOTE — Patient Instructions (Signed)

## 2021-08-01 ENCOUNTER — Other Ambulatory Visit: Payer: Self-pay

## 2021-08-01 ENCOUNTER — Other Ambulatory Visit: Payer: Medicaid Other

## 2021-08-02 ENCOUNTER — Other Ambulatory Visit: Payer: Self-pay | Admitting: Nurse Practitioner

## 2021-08-02 DIAGNOSIS — E559 Vitamin D deficiency, unspecified: Secondary | ICD-10-CM

## 2021-08-02 LAB — HEMOGLOBIN A1C
Est. average glucose Bld gHb Est-mCnc: 128 mg/dL
Hgb A1c MFr Bld: 6.1 % — ABNORMAL HIGH (ref 4.8–5.6)

## 2021-08-02 LAB — VITAMIN D 25 HYDROXY (VIT D DEFICIENCY, FRACTURES): Vit D, 25-Hydroxy: 24.3 ng/mL — ABNORMAL LOW (ref 30.0–100.0)

## 2021-08-02 MED ORDER — VITAMIN D (ERGOCALCIFEROL) 1.25 MG (50000 UNIT) PO CAPS: 50000.0000 [IU] | ORAL_CAPSULE | ORAL | 0 refills | Status: DC

## 2021-08-04 ENCOUNTER — Ambulatory Visit: Payer: Medicaid Other | Admitting: Nurse Practitioner

## 2021-08-04 ENCOUNTER — Encounter (HOSPITAL_COMMUNITY): Payer: Self-pay

## 2021-08-04 ENCOUNTER — Ambulatory Visit (HOSPITAL_COMMUNITY)
Admission: EM | Admit: 2021-08-04 | Discharge: 2021-08-04 | Disposition: A | Discharge status: Home / Self Care | Payer: Medicaid Other | Attending: Emergency Medicine | Admitting: Emergency Medicine

## 2021-08-04 ENCOUNTER — Other Ambulatory Visit: Payer: Self-pay

## 2021-08-04 DIAGNOSIS — R1011 Right upper quadrant pain: Secondary | ICD-10-CM | POA: Diagnosis present

## 2021-08-04 DIAGNOSIS — R1013 Epigastric pain: Secondary | ICD-10-CM | POA: Diagnosis not present

## 2021-08-04 LAB — COMPREHENSIVE METABOLIC PANEL
ALT: 61 U/L — ABNORMAL HIGH (ref 0–44)
AST: 55 U/L — ABNORMAL HIGH (ref 15–41)
Albumin: 3.8 g/dL (ref 3.5–5.0)
Alkaline Phosphatase: 94 U/L (ref 38–126)
Anion gap: 9 (ref 5–15)
BUN: 9 mg/dL (ref 6–20)
CO2: 29 mmol/L (ref 22–32)
Calcium: 9.4 mg/dL (ref 8.9–10.3)
Chloride: 102 mmol/L (ref 98–111)
Creatinine, Ser: 0.67 mg/dL (ref 0.44–1.00)
GFR, Estimated: >60 mL/min (ref >60)
Glucose, Bld: 105 mg/dL — ABNORMAL HIGH (ref 70–99)
Potassium: 3.6 mmol/L (ref 3.5–5.1)
Sodium: 140 mmol/L (ref 135–145)
Total Bilirubin: 1.1 mg/dL (ref 0.3–1.2)
Total Protein: 8.2 g/dL — ABNORMAL HIGH (ref 6.5–8.1)

## 2021-08-04 MED ORDER — TRAMADOL HCL 50 MG PO TABS: 50.0000 mg | ORAL_TABLET | Freq: Four times a day (QID) | ORAL | 0 refills | Status: DC | PRN

## 2021-08-04 NOTE — ED Provider Notes (Addendum)
[facility]    CSN: [id number] Arrival date & time: 08/04/21  7353      History   Chief Complaint Chief Complaint  Patient presents with   Abdominal Pain    HPI [name] is a 46 y.o. female.   Patient presents with right upper quadrant pain occurring intermittently for 2 weeks.  Endorses that symptoms became constant over the last 4 days.  Described as a dull but worsened with movement and deep breath causing the pain to become sharp and stabbing.  Pain radiates into epigastric region as well as to right flank and back.  Has attempted use of Advil and gas medication with no relief.  Denies nausea, vomiting, diarrhea, constipation, fever, chills, heartburn, indigestion, increased gas production, bloating.  Symptoms have occurred before but all testing found negative.    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 - states BP is under control; has been on med. x 12 yr.   Migraines    Right tennis elbow 11/2013    Patient Active Problem List   Diagnosis Date Noted   Spinal stenosis, lumbar 01/14/2015   Carpal tunnel syndrome 12/12/2013   HYPERLIPIDEMIA 12/05/2007   HYPERTENSION 12/05/2007   ATRIAL FIBRILLATION, PAROXYSMAL 12/05/2007   BRADYCARDIA 12/05/2007   DEGENERATIVE JOINT DISEASE 12/05/2007    Past Surgical History:  Procedure Laterality Date   ABDOMINAL HYSTERECTOMY     complete   CARPAL TUNNEL RELEASE Right 12/12/2013   Procedure: ENDOSCOPY RIGHT WRIST CARPAL TUNNEL RELEASE EXCISION GANGLION WRIST PRIMARY ;  Surgeon: Renette Butters, MD;  Location: [place];  Service: Orthopedics;  Laterality: Right;   CESAREAN SECTION  03/11/2005; 04/05/2008   CHOLECYSTECTOMY  02/11/2004   lap. chole.   LATERAL EPICONDYLE RELEASE Right 12/12/2013   Procedure: RIGHT  TENNIS ELBOW RELEASE WITH BONE REPAIR;  Surgeon: Renette Butters, MD;  Location: [place];  Service: Orthopedics;  Laterality: Right;   LUMBAR LAMINECTOMY WITH COFLEX 1 LEVEL Bilateral 01/14/2015   Procedure: Laminectomy - bilateral - L4-L5 ;  Surgeon: Karie Chimera, MD;  Location: [place] [facility];  Service: Neurosurgery;  Laterality: Bilateral;  Laminectomy - bilateral - L4-L5    LUMBAR WOUND DEBRIDEMENT N/A 02/02/2015   Procedure: LUMBAR WOUND DEBRIDEMENT, WASHOUT, CLOSURE;  Surgeon: Consuella Lose, MD;  Location: [place] [facility];  Service: Neurosurgery;  Laterality: N/A;  lumbar wound debridement   TUBAL LIGATION  04/05/2008    OB History   No obstetric history on file.      Home Medications    Prior to Admission medications   Medication Sig Start Date End Date Taking? Authorizing Provider  amLODipine (NORVASC) 10 MG tablet Take 10 mg by mouth daily. 11/06/17   [provider]  aspirin-acetaminophen-caffeine (EXCEDRIN MIGRAINE) [phone] MG tablet Take 2 tablets by mouth daily as needed for migraine.    [provider]  atenolol (TENORMIN) 25 MG tablet Take 25 mg by mouth daily. 04/15/21   [provider]  estradiol (ESTRACE) 2 MG tablet Take 2 mg by mouth daily. 03/11/21   [provider]  hydrochlorothiazide (HYDRODIURIL) 25 MG tablet Take 25 mg by mouth every morning.     [provider]  losartan (COZAAR) 100 MG  tablet Take 100 mg by mouth daily.    [provider]  omeprazole (PRILOSEC) 20 MG capsule Take 20 mg by mouth daily.    [provider]  rizatriptan (MAXALT) 10 MG tablet TK 1 T PO  AT ONSET  AND  MAY REPEAT IN 2 HOURS IF NEEDED 11/06/17   [provider]  Vitamin D, Ergocalciferol, (DRISDOL) 1.25 MG (50000 UNIT) CAPS capsule Take 1 capsule (50,000 Units total) by mouth every 7 (seven) days. 08/02/21   Bary Castilla, NP    Family History Family History  Problem Relation Age of Onset    Hypertension Mother    Diabetes Mother    Cancer Mother    Diabetes Brother    Hypertension Brother    Diabetes Brother    Hypertension Maternal Grandmother    Heart attack Paternal Grandmother    Cancer Other     Social History Social History   Tobacco Use   Smoking status: Never   Smokeless tobacco: Never  Vaping Use   Vaping Use: Never used  Substance Use Topics   Alcohol use: No   Drug use: No     Allergies   Patient has no known allergies.   Review of Systems Review of Systems  Constitutional: Negative.   Respiratory: Negative.    Cardiovascular: Negative.   Gastrointestinal:  Positive for abdominal pain. Negative for abdominal distention, anal bleeding, blood in stool, constipation, diarrhea, nausea, rectal pain and vomiting.  Skin: Negative.     Physical Exam Triage Vital Signs ED Triage Vitals  Enc Vitals Group     BP 08/04/21 1030 (!) 175/94     Pulse Rate 08/04/21 1030 76     Resp 08/04/21 1030 18     Temp 08/04/21 1030 98.3 F (36.8 C)     Temp Source 08/04/21 1030 Oral     SpO2 08/04/21 1030 100 %     Weight --      Height --      Head Circumference --      Peak Flow --      Pain Score 08/04/21 1031 7     Pain Loc --      Pain Edu? --      Excl. in [place]? --    No data found.  Updated Vital Signs BP (!) 175/94 (BP Location: Left Arm)    Pulse 76    Temp 98.3 F (36.8 C) (Oral)    Resp 18    LMP 09/17/2010    SpO2 100%   Visual Acuity Right Eye Distance:   Left Eye Distance:   Bilateral Distance:    Right Eye Near:   Left Eye Near:    Bilateral Near:     Physical Exam Constitutional:      Appearance: Normal appearance.  Eyes:     Extraocular Movements: Extraocular movements intact.  Pulmonary:     Effort: Pulmonary effort is normal.  Abdominal:     General: Abdomen is flat. Bowel sounds are normal.     Palpations: Abdomen is soft.     Tenderness: There is abdominal tenderness in the right upper quadrant and epigastric area.   Skin:    General: Skin is warm and dry.  Neurological:     General: No focal deficit present.     Mental Status: She is alert and oriented to person, place, and time.  Psychiatric:        Mood and Affect: Mood normal.  Behavior: Behavior normal.     UC Treatments / Results  Labs (all labs ordered are listed, but only abnormal results are displayed) Labs Reviewed - No data to display  EKG   Radiology No results found.  Procedures Procedures (including critical care time)  Medications Ordered in UC Medications - No data to display  Initial Impression / Assessment and Plan / UC Course  I have reviewed the triage vital signs and the nursing notes.  Pertinent labs & imaging results that were available during my care of the patient were reviewed by me and considered in my medical decision making (see chart for details).  Right upper quadrant abdominal pain Epigastric pain  Etiology of symptoms unknown at this time, discussed with patient, low suspicion of viral or bacterial process  due to length of symptoms without accompanying symptoms, unable to palpate liver on exam however after chart review, history of elevated liver enzymes, CMP pending, reviewed CT of abdomen from October 2022, showing fatty liver, discussed with patient, tramadol prescribed for pain management, given walking referral to GI for further evaluation  Code at level 4 due to time taken for chart review Final Clinical Impressions(s) / UC Diagnoses   Final diagnoses:  None   Discharge Instructions   None    ED Prescriptions   None    PDMP not reviewed this encounter.   Hans Eden, NP 08/04/21 1114    Hans Eden, NP 08/04/21 1115

## 2021-08-04 NOTE — ED Triage Notes (Signed)
Pt presents to the office for right side abdominal pain x 2 weeks.

## 2021-08-04 NOTE — Discharge Instructions (Addendum)
Today your abdominal pain is stable, I have a low suspicion of any infectious cause due to the length of your illness without accompanying symptoms and without you presenting ill  I recommend following up with a GI specialist for further evaluation of your symptoms  We have completed lab work today to check your liver enzymes, you will be notified of any concerning values  You may use tramadol every 6 hours as needed for severe pain  At any point if your abdominal pain worsens in severity please go to the nearest emergency department for further evaluation

## 2021-08-09 ENCOUNTER — Ambulatory Visit: Payer: Medicaid Other | Admitting: Nurse Practitioner

## 2021-09-26 DIAGNOSIS — R109 Unspecified abdominal pain: Secondary | ICD-10-CM | POA: Insufficient documentation

## 2021-09-26 DIAGNOSIS — Z79890 Hormone replacement therapy (postmenopausal): Secondary | ICD-10-CM | POA: Insufficient documentation

## 2021-09-27 ENCOUNTER — Other Ambulatory Visit: Payer: Self-pay

## 2021-09-27 ENCOUNTER — Encounter: Payer: Self-pay | Admitting: Nurse Practitioner

## 2021-09-27 ENCOUNTER — Ambulatory Visit (INDEPENDENT_AMBULATORY_CARE_PROVIDER_SITE_OTHER): Payer: Medicaid Other | Admitting: Nurse Practitioner

## 2021-09-27 VITALS — BP 116/80 | HR 85 | Temp 97.9°F | Ht 60.2 in | Wt 250.0 lb

## 2021-09-27 DIAGNOSIS — I10 Essential (primary) hypertension: Secondary | ICD-10-CM | POA: Diagnosis not present

## 2021-09-27 DIAGNOSIS — F331 Major depressive disorder, recurrent, moderate: Secondary | ICD-10-CM

## 2021-09-27 DIAGNOSIS — M79642 Pain in left hand: Secondary | ICD-10-CM

## 2021-09-27 DIAGNOSIS — M545 Low back pain, unspecified: Secondary | ICD-10-CM | POA: Diagnosis not present

## 2021-09-27 DIAGNOSIS — M5416 Radiculopathy, lumbar region: Secondary | ICD-10-CM

## 2021-09-27 DIAGNOSIS — Z9889 Other specified postprocedural states: Secondary | ICD-10-CM

## 2021-09-27 DIAGNOSIS — Z6841 Body Mass Index (BMI) 40.0 and over, adult: Secondary | ICD-10-CM

## 2021-09-27 DIAGNOSIS — G8929 Other chronic pain: Secondary | ICD-10-CM

## 2021-09-27 DIAGNOSIS — G5602 Carpal tunnel syndrome, left upper limb: Secondary | ICD-10-CM

## 2021-09-27 DIAGNOSIS — Z8543 Personal history of malignant neoplasm of ovary: Secondary | ICD-10-CM

## 2021-09-27 MED ORDER — HYDROCHLOROTHIAZIDE 25 MG PO TABS: 25.0000 mg | ORAL_TABLET | Freq: Every morning | ORAL | 1 refills | Status: DC

## 2021-09-27 MED ORDER — LOSARTAN POTASSIUM 100 MG PO TABS: 100.0000 mg | ORAL_TABLET | Freq: Every day | ORAL | 1 refills | Status: DC

## 2021-09-27 NOTE — Progress Notes (Unsigned)
I,Katawbba Wiggins,acting as a Education administrator for [employer], FNP.,have documented all relevant documentation on the behalf of Minette Brine, FNP,as directed by  Minette Brine, FNP while in the presence of Minette Brine, [place].   This visit occurred during the SARS-CoV-2 public health emergency.  Safety protocols were in place, including screening questions prior to the visit, additional usage of staff PPE, and extensive cleaning of exam room while observing appropriate contact time as indicated for disinfecting solutions.  Subjective:     Patient ID: [name] , female    DOB: 1976-02-09 , 46 y.o.   MRN: [id number]   Chief Complaint  Patient presents with   Hypertension    HPI  She has a history of ovarian cancer in 2015 had a complete hysterectomy. She is not having any menopausal symptoms. Her mother had breast cancer. She had a mammogram last week  She is having numbness and tingling to her left hand, has used a hand splint and wears at night does not feel any difference. Reports having back surgery in the past less than 10 years. Done at [facility]. She has pain to bilateral lateral thighs, feels like a stinging or burning sensation, more on left but now feeling on the right. She had PT last year for her back pain at [facility]. When she stands for a long time she has more pain.  Reports going to walk once weekly.  She has continued to exercises learned in PT.    Hypertension This is a chronic problem. The current episode started more than 1 year ago. The problem is controlled. Pertinent negatives include no anxiety or headaches. Risk factors for coronary artery disease include obesity and sedentary lifestyle. Past treatments include diuretics, calcium channel blockers and angiotensin blockers. There are no compliance problems.  There is no history of angina. There is no history of chronic renal disease.    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 - states BP is under control; has been on med. x 12 yr.   Migraines    Right tennis elbow 11/2013     Family History  Problem Relation Age of Onset   Hypertension Mother    Diabetes Mother    Cancer Mother    Diabetes Brother    Hypertension Brother    Diabetes Brother    Hypertension Maternal Grandmother    Heart attack Paternal Grandmother    Cancer Other      Current Outpatient Medications:    amLODipine (NORVASC) 10 MG tablet, Take 10 mg by mouth daily., Disp: , Rfl: 5   aspirin-acetaminophen-caffeine (EXCEDRIN MIGRAINE) 250-250-65 MG tablet, Take 2 tablets by mouth daily as needed for migraine., Disp: , Rfl:    atenolol (TENORMIN) 25 MG tablet, Take 25 mg by mouth daily., Disp: , Rfl:    estradiol (ESTRACE) 2 MG tablet, Take 2 mg by mouth daily., Disp: , Rfl:    omeprazole (PRILOSEC) 20 MG capsule, Take 20 mg by mouth daily., Disp: , Rfl:    rizatriptan (MAXALT) 10 MG tablet, TK 1 T PO  AT ONSET  AND  MAY REPEAT IN 2 HOURS IF NEEDED, Disp: , Rfl: 5   traMADol (ULTRAM) 50 MG tablet, Take 1 tablet (50 mg total)  by mouth every 6 (six) hours as needed., Disp: 15 tablet, Rfl: 0   Vitamin D, Ergocalciferol, (DRISDOL) 1.25 MG (50000 UNIT) CAPS capsule, Take 1 capsule (50,000 Units total) by mouth every 7 (seven) days., Disp: 12 capsule, Rfl: 0   hydrochlorothiazide (HYDRODIURIL) 25 MG tablet, Take 1 tablet (25 mg total) by mouth every morning., Disp: 90 tablet, Rfl: 1   losartan (COZAAR) 100 MG tablet, Take 1 tablet (100 mg total) by mouth daily., Disp: 90 tablet, Rfl: 1   No Known Allergies   Review of Systems  Constitutional: Negative.   Respiratory: Negative.    Neurological:  Positive for numbness (left hand). Negative for dizziness and headaches.  Psychiatric/Behavioral: Negative.      Today's Vitals   09/27/21 1415  BP:  116/80  Pulse: 85  Temp: 97.9 F (36.6 C)  Weight: 250 lb (113.4 kg)  Height: 5' 0.2" (1.529 m)   Body mass index is 48.5 kg/m.  Wt Readings from Last 3 Encounters:  09/27/21 250 lb (113.4 kg)  06/23/21 249 lb (112.9 kg)  12/28/17 240 lb (108.9 kg)    BP Readings from Last 3 Encounters:  09/27/21 116/80  08/04/21 (!) 175/94  06/23/21 110/80    Objective:  Physical Exam Vitals reviewed.  Constitutional:      Appearance: Normal appearance.  Cardiovascular:     Rate and Rhythm: Normal rate and regular rhythm.     Pulses: Normal pulses.     Heart sounds: Normal heart sounds. No murmur heard. Neurological:     General: No focal deficit present.     Mental Status: She is alert and oriented to person, place, and time.     Cranial Nerves: No cranial nerve deficit.     Motor: No weakness.  Psychiatric:        Mood and Affect: Mood normal.        Behavior: Behavior normal.        Thought Content: Thought content normal.        Judgment: Judgment normal.        Assessment And Plan:     1. Essential hypertension - BMP8+EGFR - hydrochlorothiazide (HYDRODIURIL) 25 MG tablet; Take 1 tablet (25 mg total) by mouth every morning.  Dispense: 90 tablet; Refill: 1 - losartan (COZAAR) 100 MG tablet; Take 1 tablet (100 mg total) by mouth daily.  Dispense: 90 tablet; Refill: 1  2. Class 3 severe obesity due to excess calories with body mass index (BMI) of 45.0 to 49.9 in adult, unspecified whether serious comorbidity present ([place])  3. History of ovarian cancer  She is encouraged to strive for BMI less than 30 to decrease cardiac risk. Advised to aim for at least 150 minutes of exercise per week.    Patient was given opportunity to ask questions. Patient verbalized understanding of the plan and was able to repeat key elements of the plan. All questions were answered to their satisfaction.  Minette Brine, FNP   I, Minette Brine, FNP, have reviewed all documentation for this visit. The  documentation on 09/27/21 for the exam, diagnosis, procedures, and orders are all accurate and complete.   IF YOU HAVE BEEN REFERRED TO A SPECIALIST, IT MAY TAKE 1-2 WEEKS TO SCHEDULE/PROCESS THE REFERRAL. IF YOU HAVE NOT HEARD FROM US/SPECIALIST IN TWO WEEKS, PLEASE GIVE [place] A CALL AT [phone].   THE PATIENT IS ENCOURAGED TO PRACTICE SOCIAL DISTANCING DUE TO THE COVID-19 PANDEMIC.

## 2021-09-27 NOTE — Patient Instructions (Signed)

## 2021-09-28 ENCOUNTER — Other Ambulatory Visit: Payer: Self-pay | Admitting: Nurse Practitioner

## 2021-09-28 DIAGNOSIS — R7989 Other specified abnormal findings of blood chemistry: Secondary | ICD-10-CM

## 2021-09-28 LAB — CMP14+EGFR
ALT: 79 IU/L — ABNORMAL HIGH (ref 0–32)
AST: 78 IU/L — ABNORMAL HIGH (ref 0–40)
Albumin/Globulin Ratio: 1.3 (ref 1.2–2.2)
Albumin: 4.6 g/dL (ref 3.8–4.8)
Alkaline Phosphatase: 123 IU/L — ABNORMAL HIGH (ref 44–121)
BUN/Creatinine Ratio: 18 (ref 9–23)
BUN: 12 mg/dL (ref 6–24)
Bilirubin Total: 0.4 mg/dL (ref 0.0–1.2)
CO2: 24 mmol/L (ref 20–29)
Calcium: 9.6 mg/dL (ref 8.7–10.2)
Chloride: 98 mmol/L (ref 96–106)
Creatinine, Ser: 0.65 mg/dL (ref 0.57–1.00)
Globulin, Total: 3.5 g/dL (ref 1.5–4.5)
Glucose: 78 mg/dL (ref 70–99)
Potassium: 3.7 mmol/L (ref 3.5–5.2)
Sodium: 138 mmol/L (ref 134–144)
Total Protein: 8.1 g/dL (ref 6.0–8.5)
eGFR: 112 mL/min/{1.73_m2} (ref >59)

## 2021-10-05 ENCOUNTER — Encounter: Payer: Self-pay | Admitting: Nurse Practitioner

## 2021-10-07 ENCOUNTER — Other Ambulatory Visit: Payer: Self-pay

## 2021-10-07 ENCOUNTER — Ambulatory Visit
Admission: RE | Admit: 2021-10-07 | Discharge: 2021-10-07 | Disposition: A | Discharge status: Home / Self Care | Payer: Medicaid Other | Source: Ambulatory Visit | Attending: Nurse Practitioner | Admitting: Nurse Practitioner

## 2021-10-07 DIAGNOSIS — R7989 Other specified abnormal findings of blood chemistry: Secondary | ICD-10-CM

## 2021-10-11 ENCOUNTER — Ambulatory Visit (INDEPENDENT_AMBULATORY_CARE_PROVIDER_SITE_OTHER): Payer: Medicaid Other | Admitting: Orthopedic Surgery

## 2021-10-11 ENCOUNTER — Encounter: Payer: Self-pay | Admitting: Orthopedic Surgery

## 2021-10-11 ENCOUNTER — Other Ambulatory Visit: Payer: Self-pay

## 2021-10-11 DIAGNOSIS — R200 Anesthesia of skin: Secondary | ICD-10-CM

## 2021-10-11 DIAGNOSIS — R202 Paresthesia of skin: Secondary | ICD-10-CM | POA: Diagnosis not present

## 2021-10-11 NOTE — Progress Notes (Signed)
? ?Office Visit Note ?  ?Patient: [name]           ?Date of Birth: 04/09/1976           ?MRN: [id number] ?Visit Date: 10/11/2021 ?             ?Requested by: Minette Brine, [place] ?[address] ?[address] ?[place],  [place] [id number] ?PCP: Minette Brine, FNP ? ? ?Assessment & Plan: ?Visit Diagnoses:  ?1. Numbness and tingling in left hand   ? ? ?Plan: We discussed the nature of presumed carpal tunnel syndrome as well as its diagnosis, prognosis and both conservative and surgical treatment options.  Discussed with the patient, that I would like to get an EMG/nerve conduction study to further evaluate her symptoms.  I can see her back in the office once the study is complete and we will discuss the results and determine the best course of treatment. ? ?Follow-Up Instructions: No follow-ups on file.  ? ?Orders:  ?No orders of the defined types were placed in this encounter. ? ?No orders of the defined types were placed in this encounter. ? ? ? ? Procedures: ?No procedures performed ? ? ?Clinical Data: ?No additional findings. ? ? ?Subjective: ?Chief Complaint  ?Patient presents with  ? Left Wrist - Numbness, Weakness  ?  RIGHT Handed, Pain 6/10, goes numb with certain activities, tried brace but it was uncomfortable, onset x 6 months  ? ? ?This is a 46 year old right-hand-dominant female who presents with numbness and tingling of her left hand.  This has been going on for about 6 months.  She describes numbness and tingling in all her fingers.  Her symptoms are intermittent.  They occur with certain activities such as driving a car.  She wakes at night for multiple reasons but notes that when she wakes her left hand is numb and tingly.  She shakes her hand for symptom relief.  She has worn a night brace with minimal symptom improvement.  She never had any nerve studies.  She has had a right carpal tunnel release many years ago for similar symptoms.  She has no history of diabetes, thyroid disease, cervical spine  issue, wrist trauma, or inflammatory arthropathy. ? ?Weakness ?Associated symptoms include weakness.  ? ?Review of Systems  ?Neurological:  Positive for weakness.  ? ? ?Objective: ?Vital Signs: BP (!) 154/92 (BP Location: Left Arm, Patient Position: Sitting)   Pulse 67   Ht 5' (1.524 m)   Wt 250 lb (113.4 kg)   LMP 09/17/2010   BMI 48.82 kg/m?  ? ?Physical Exam ?Constitutional:   ?   Appearance: Normal appearance.  ?Cardiovascular:  ?   Rate and Rhythm: Normal rate.  ?   Pulses: Normal pulses.  ?Pulmonary:  ?   Effort: Pulmonary effort is normal.  ?Skin: ?   General: Skin is warm and dry.  ?   Capillary Refill: Capillary refill takes less than 2 seconds.  ?Neurological:  ?   Mental Status: She is alert.  ? ? ?Left Hand Exam  ? ?Tenderness  ?The patient is experiencing no tenderness.  ? ?Range of Motion  ?The patient has normal left wrist ROM. ? ?Other  ?Erythema: absent ?Sensation: normal ?Pulse: present ? ?Comments:  Positive Tinel and Phalen signs at wrist.  Negative Tinel at elbow.  4/5 thenar motor strength compared to contralateral side but without atrophy.   ? ? ? ? ?Specialty Comments:  ?No specialty comments available. ? ?Imaging: ?No results found. ? ? ?  PMFS History: ?Patient Active Problem List  ? Diagnosis Date Noted  ? Numbness and tingling in left hand 10/11/2021  ? Spinal stenosis, lumbar 01/14/2015  ? Carpal tunnel syndrome 12/12/2013  ? HYPERLIPIDEMIA 12/05/2007  ? HYPERTENSION 12/05/2007  ? ATRIAL FIBRILLATION, PAROXYSMAL 12/05/2007  ? BRADYCARDIA 12/05/2007  ? DEGENERATIVE JOINT DISEASE 12/05/2007  ? ?Past Medical History:  ?Diagnosis Date  ? Anxiety   ? Cancer [facility])   ? ovarian cancer  ? Carpal tunnel syndrome of right wrist 11/2013  ? Depression   ? Ganglion cyst of wrist 11/2013  ? right  ? Hypertension   ? is on 3 meds. - states BP is under control; has been on med. x 12 yr.  ? Migraines   ? Right tennis elbow 11/2013  ?  ?Family History  ?Problem Relation Age of Onset  ? Hypertension Mother    ? Diabetes Mother   ? Cancer Mother   ? Diabetes Brother   ? Hypertension Brother   ? Diabetes Brother   ? Hypertension Maternal Grandmother   ? Heart attack Paternal Grandmother   ? Cancer Other   ?  ?Past Surgical History:  ?Procedure Laterality Date  ? ABDOMINAL HYSTERECTOMY    ? complete  ? CARPAL TUNNEL RELEASE Right 12/12/2013  ? Procedure: ENDOSCOPY RIGHT WRIST CARPAL TUNNEL RELEASE EXCISION GANGLION WRIST PRIMARY ;  Surgeon: Renette Butters, MD;  Location: [place];  Service: Orthopedics;  Laterality: Right;  ? CESAREAN SECTION  03/11/2005; 04/05/2008  ? CHOLECYSTECTOMY  02/11/2004  ? lap. chole.  ? LATERAL EPICONDYLE RELEASE Right 12/12/2013  ? Procedure: RIGHT TENNIS ELBOW RELEASE WITH BONE REPAIR;  Surgeon: Renette Butters, MD;  Location: [place] [facility];  Service: Orthopedics;  Laterality: Right;  ? LUMBAR LAMINECTOMY WITH COFLEX 1 LEVEL Bilateral 01/14/2015  ? Procedure: Laminectomy - bilateral - L4-L5 ;  Surgeon: Karie Chimera, MD;  Location: [place] [facility];  Service: Neurosurgery;  Laterality: Bilateral;  Laminectomy - bilateral - L4-L5   ? LUMBAR WOUND DEBRIDEMENT N/A 02/02/2015  ? Procedure: LUMBAR WOUND DEBRIDEMENT, WASHOUT, CLOSURE;  Surgeon: Consuella Lose, MD;  Location: [place] [facility];  Service: Neurosurgery;  Laterality: N/A;  lumbar wound debridement  ? TUBAL LIGATION  04/05/2008  ? ?Social History  ? ?Occupational History  ? Not on file  ?Tobacco Use  ? Smoking status: Never  ? Smokeless tobacco: Never  ?Vaping Use  ? Vaping Use: Never used  ?Substance and Sexual Activity  ? Alcohol use: No  ? Drug use: No  ? Sexual activity: Not on file  ? ? ? ? ? ? ?

## 2021-11-11 ENCOUNTER — Ambulatory Visit: Payer: Medicaid Other | Admitting: Physical Medicine and Rehabilitation

## 2021-11-11 ENCOUNTER — Encounter: Payer: Self-pay | Admitting: Physical Medicine and Rehabilitation

## 2021-11-11 DIAGNOSIS — R202 Paresthesia of skin: Secondary | ICD-10-CM | POA: Diagnosis not present

## 2021-11-11 NOTE — Progress Notes (Signed)
? ?[name] - 46 y.o. female MRN [id number]  Date of birth: [birth year] ? ?Office Visit Note: ?Visit Date: 11/11/2021 ?PCP: Minette Brine, FNP ?Referred by: Sherilyn Cooter, MD ? ?Subjective: ?Chief Complaint  ?Patient presents with  ? Left Hand - Pain, Numbness, Tingling  ? ?HPI:  [name] is a 46 y.o. female who comes in today at the request of Dr. Sherilyn Cooter for electrodiagnostic study of the Left upper extremities.  Patient is Right hand dominant.  She endorses left forearm pain with referral into the radial digits with numbness and tingling on the left. This has been going on for about 6-7 months.  She describes numbness and tingling in all her fingers.  Her symptoms are intermittent.  She does get worsening with driving her car and also has nocturnal complaints with a positive flick sign.  She has worn a night brace with minimal symptom improvement.  She never had any nerve studies.  She has had a right carpal tunnel release many years ago for similar symptoms.   ? ?ROS Otherwise per HPI. ? ?Assessment & Plan: ?Visit Diagnoses:  ?  ICD-10-CM   ?1. Paresthesia of skin  R20.2 NCV with EMG (electromyography)  ?  ?  ?Plan: Impression: ?The above electrodiagnostic study is ABNORMAL and reveals evidence of a moderate left median nerve entrapment at the wrist (carpal tunnel syndrome) affecting sensory and motor components.  ? ?There is no significant electrodiagnostic evidence of any other focal nerve entrapment, brachial plexopathy or cervical radiculopathy.  ? ?Recommendations: ?1.  Follow-up with referring physician. ?2.  Continue current management of symptoms. ?3.  Suggest surgical evaluation. ? ?Meds & Orders: No orders of the defined types were placed in this encounter. ?  ?Orders Placed This Encounter  ?Procedures  ? NCV with EMG (electromyography)  ?  ?Follow-up: Return in about 2 weeks (around 11/25/2021) for Sherilyn Cooter, MD.  ? ?Procedures: ?No procedures performed  ?EMG & NCV  Findings: ?Evaluation of the left median motor nerve showed prolonged distal onset latency (5.0 ms) and decreased conduction velocity (Elbow-Wrist, 47 m/s).  The left median (across palm) sensory nerve showed prolonged distal peak latency (Wrist, 4.6 ms).  All remaining nerves (as indicated in the following tables) were within normal limits.   ? ?All examined muscles (as indicated in the following table) showed no evidence of electrical instability.   ? ?Impression: ?The above electrodiagnostic study is ABNORMAL and reveals evidence of a moderate left median nerve entrapment at the wrist (carpal tunnel syndrome) affecting sensory and motor components.  ? ?There is no significant electrodiagnostic evidence of any other focal nerve entrapment, brachial plexopathy or cervical radiculopathy.  ? ?Recommendations: ?1.  Follow-up with referring physician. ?2.  Continue current management of symptoms. ?3.  Suggest surgical evaluation. ? ?___________________________ ?Laurence Spates FAAPMR ?Board Certified, Tax adviser of Physical Medicine and Rehabilitation ? ? ? ?Nerve Conduction Studies ?Anti Sensory Summary Table ? ? Stim Site NR Peak (ms) Norm Peak (ms) P-T Amp (?V) Norm P-T Amp Site1 Site2 Delta-P (ms) Dist (cm) Vel (m/s) Norm Vel (m/s)  ?Left Median Acr Palm Anti Sensory (2nd Digit)  31.8?C  ?Wrist    *4.6 <3.6 24.1 >10 Wrist Palm 2.6 0.0    ?Palm    2.0 <2.0 26.4         ?Left Radial Anti Sensory (Base 1st Digit)  32?C  ?Wrist    2.0 <3.1 26.5  Wrist Base 1st Digit 2.0 0.0    ?Left Ulnar  Anti Sensory (5th Digit)  32.1?C  ?Wrist    3.1 <3.7 29.3 >15.0 Wrist 5th Digit 3.1 14.0 45 >38  ? ?Motor Summary Table ? ? Stim Site NR Onset (ms) Norm Onset (ms) O-P Amp (mV) Norm O-P Amp Site1 Site2 Delta-0 (ms) Dist (cm) Vel (m/s) Norm Vel (m/s)  ?Left Median Motor (Abd Poll Brev)  32.2?C  ?Wrist    *5.0 <4.2 7.2 >5 Elbow Wrist 3.8 18.0 *47 >50  ?Elbow    8.8  5.9         ?Left Ulnar Motor (Abd Dig Min)  32.3?C  ?Wrist    3.0 <4.2  7.8 >3 B Elbow Wrist 2.6 16.0 62 >53  ?B Elbow    5.6  6.8  A Elbow B Elbow 1.6 10.0 62 >53  ?A Elbow    7.2  6.1         ? ?EMG ? ? Side Muscle Nerve Root Ins Act Fibs Psw Amp Dur Poly Recrt Int Fraser Din Comment  ?Left Abd Poll Brev Median C8-T1 Nml Nml Nml Nml Nml 0 Nml Nml   ?Left 1stDorInt Ulnar C8-T1 Nml Nml Nml Nml Nml 0 Nml Nml   ?Left PronatorTeres Median C6-7 Nml Nml Nml Nml Nml 0 Nml Nml   ?Left Biceps Musculocut C5-6 Nml Nml Nml Nml Nml 0 Nml Nml   ?Left Deltoid Axillary C5-6 Nml Nml Nml Nml Nml 0 Nml Nml   ? ? ?Nerve Conduction Studies ?Anti Sensory Left/Right Comparison ? ? Stim Site L Lat (ms) R Lat (ms) L-R Lat (ms) L Amp (?V) R Amp (?V) L-R Amp (%) Site1 Site2 L Vel (m/s) R Vel (m/s) L-R Vel (m/s)  ?Median Acr Palm Anti Sensory (2nd Digit)  31.8?C  ?Wrist *4.6   24.1   Wrist Palm     ?Palm 2.0   26.4         ?Radial Anti Sensory (Base 1st Digit)  32?C  ?Wrist 2.0   26.5   Wrist Base 1st Digit     ?Ulnar Anti Sensory (5th Digit)  32.1?C  ?Wrist 3.1   29.3   Wrist 5th Digit 45    ? ?Motor Left/Right Comparison ? ? Stim Site L Lat (ms) R Lat (ms) L-R Lat (ms) L Amp (mV) R Amp (mV) L-R Amp (%) Site1 Site2 L Vel (m/s) R Vel (m/s) L-R Vel (m/s)  ?Median Motor (Abd Poll Brev)  32.2?C  ?Wrist *5.0   7.2   Elbow Wrist *47    ?Elbow 8.8   5.9         ?Ulnar Motor (Abd Dig Min)  32.3?C  ?Wrist 3.0   7.8   B Elbow Wrist 62    ?B Elbow 5.6   6.8   A Elbow B Elbow 62    ?A Elbow 7.2   6.1         ? ? ? ?Waveforms: ?    ? ?   ? ?  ? ?Clinical History: ?No specialty comments available.  ? ? ? ?Objective:  VS:  HT:    WT:   BMI:     BP:   HR: bpm  TEMP: ( )  RESP:  ?Physical Exam ?Musculoskeletal:     ?   General: No swelling, tenderness or deformity.  ?   Comments: Inspection reveals no atrophy of the bilateral APB or FDI or hand intrinsics. There is no swelling, color changes, allodynia or dystrophic changes. There is 5 out of 5 strength  in the bilateral wrist extension, finger abduction and long finger flexion.  There is intact sensation to light touch in all dermatomal and peripheral nerve distributions. There is a negative Froment's test bilaterally. There is a negative Tinel's test at the bilateral wrist and elbow. There is a negative Phalen's test bilaterally. There is a negative Hoffmann's test bilaterally.  ?Skin: ?   General: Skin is warm and dry.  ?   Findings: No erythema or rash.  ?Neurological:  ?   General: No focal deficit present.  ?   Mental Status: She is alert and oriented to person, place, and time.  ?   Motor: No weakness or abnormal muscle tone.  ?   Coordination: Coordination normal.  ?Psychiatric:     ?   Mood and Affect: Mood normal.     ?   Behavior: Behavior normal.  ?  ? ?Imaging: ?No results found. ?

## 2021-11-11 NOTE — Progress Notes (Signed)
Pt state left forearm pain that travels to her finger. Pt state she has tingling and numbness in her fingers. Pt state she has been dropping things and her hand falls asleep. Pt state she right handed. ? ?Numeric Pain Rating Scale and Functional Assessment ?Average Pain 4 ? ? ?In the last MONTH (on 0-10 scale) has pain interfered with the following? ? ?1. General activity like being  able to carry out your everyday physical activities such as walking, climbing stairs, carrying groceries, or moving a chair?  ?Rating(9) ? ? ? ? ?

## 2021-11-14 ENCOUNTER — Other Ambulatory Visit: Payer: Self-pay | Admitting: Nurse Practitioner

## 2021-11-14 NOTE — Procedures (Signed)
EMG & NCV Findings: ?Evaluation of the left median motor nerve showed prolonged distal onset latency (5.0 ms) and decreased conduction velocity (Elbow-Wrist, 47 m/s).  The left median (across palm) sensory nerve showed prolonged distal peak latency (Wrist, 4.6 ms).  All remaining nerves (as indicated in the following tables) were within normal limits.   ? ?All examined muscles (as indicated in the following table) showed no evidence of electrical instability.   ? ?Impression: ?The above electrodiagnostic study is ABNORMAL and reveals evidence of a moderate left median nerve entrapment at the wrist (carpal tunnel syndrome) affecting sensory and motor components.  ? ?There is no significant electrodiagnostic evidence of any other focal nerve entrapment, brachial plexopathy or cervical radiculopathy.  ? ?Recommendations: ?1.  Follow-up with referring physician. ?2.  Continue current management of symptoms. ?3.  Suggest surgical evaluation. ? ?___________________________ ?[name] FAAPMR ?Board Certified, Tax adviser of Physical Medicine and Rehabilitation ? ? ? ?Nerve Conduction Studies ?Anti Sensory Summary Table ? ? Stim Site NR Peak (ms) Norm Peak (ms) P-T Amp (?V) Norm P-T Amp Site1 Site2 Delta-P (ms) Dist (cm) Vel (m/s) Norm Vel (m/s)  ?Left Median Acr Palm Anti Sensory (2nd Digit)  31.8?C  ?Wrist    *4.6 <3.6 24.1 >10 Wrist Palm 2.6 0.0    ?Palm    2.0 <2.0 26.4         ?Left Radial Anti Sensory (Base 1st Digit)  32?C  ?Wrist    2.0 <3.1 26.5  Wrist Base 1st Digit 2.0 0.0    ?Left Ulnar Anti Sensory (5th Digit)  32.1?C  ?Wrist    3.1 <3.7 29.3 >15.0 Wrist 5th Digit 3.1 14.0 45 >38  ? ?Motor Summary Table ? ? Stim Site NR Onset (ms) Norm Onset (ms) O-P Amp (mV) Norm O-P Amp Site1 Site2 Delta-0 (ms) Dist (cm) Vel (m/s) Norm Vel (m/s)  ?Left Median Motor (Abd Poll Brev)  32.2?C  ?Wrist    *5.0 <4.2 7.2 >5 Elbow Wrist 3.8 18.0 *47 >50  ?Elbow    8.8  5.9         ?Left Ulnar Motor (Abd Dig Min)  32.3?C  ?Wrist     3.0 <4.2 7.8 >3 B Elbow Wrist 2.6 16.0 62 >53  ?B Elbow    5.6  6.8  A Elbow B Elbow 1.6 10.0 62 >53  ?A Elbow    7.2  6.1         ? ?EMG ? ? Side Muscle Nerve Root Ins Act Fibs Psw Amp Dur Poly Recrt Int Fraser Din Comment  ?Left Abd Poll Brev Median C8-T1 Nml Nml Nml Nml Nml 0 Nml Nml   ?Left 1stDorInt Ulnar C8-T1 Nml Nml Nml Nml Nml 0 Nml Nml   ?Left PronatorTeres Median C6-7 Nml Nml Nml Nml Nml 0 Nml Nml   ?Left Biceps Musculocut C5-6 Nml Nml Nml Nml Nml 0 Nml Nml   ?Left Deltoid Axillary C5-6 Nml Nml Nml Nml Nml 0 Nml Nml   ? ? ?Nerve Conduction Studies ?Anti Sensory Left/Right Comparison ? ? Stim Site L Lat (ms) R Lat (ms) L-R Lat (ms) L Amp (?V) R Amp (?V) L-R Amp (%) Site1 Site2 L Vel (m/s) R Vel (m/s) L-R Vel (m/s)  ?Median Acr Palm Anti Sensory (2nd Digit)  31.8?C  ?Wrist *4.6   24.1   Wrist Palm     ?Palm 2.0   26.4         ?Radial Anti Sensory (Base 1st Digit)  32?C  ?  Wrist 2.0   26.5   Wrist Base 1st Digit     ?Ulnar Anti Sensory (5th Digit)  32.1?C  ?Wrist 3.1   29.3   Wrist 5th Digit 45    ? ?Motor Left/Right Comparison ? ? Stim Site L Lat (ms) R Lat (ms) L-R Lat (ms) L Amp (mV) R Amp (mV) L-R Amp (%) Site1 Site2 L Vel (m/s) R Vel (m/s) L-R Vel (m/s)  ?Median Motor (Abd Poll Brev)  32.2?C  ?Wrist *5.0   7.2   Elbow Wrist *47    ?Elbow 8.8   5.9         ?Ulnar Motor (Abd Dig Min)  32.3?C  ?Wrist 3.0   7.8   B Elbow Wrist 62    ?B Elbow 5.6   6.8   A Elbow B Elbow 62    ?A Elbow 7.2   6.1         ? ? ? ?Waveforms: ?    ? ?   ? ? ?

## 2021-11-15 MED ORDER — AMLODIPINE BESYLATE 10 MG PO TABS: 10.0000 mg | ORAL_TABLET | Freq: Every day | ORAL | 0 refills | Status: DC

## 2021-11-20 ENCOUNTER — Encounter: Payer: Self-pay | Admitting: Nurse Practitioner

## 2021-11-25 ENCOUNTER — Ambulatory Visit: Payer: Medicaid Other | Admitting: Orthopedic Surgery

## 2021-11-25 ENCOUNTER — Ambulatory Visit (INDEPENDENT_AMBULATORY_CARE_PROVIDER_SITE_OTHER): Payer: Medicaid Other | Admitting: Orthopedic Surgery

## 2021-11-25 DIAGNOSIS — G5602 Carpal tunnel syndrome, left upper limb: Secondary | ICD-10-CM

## 2021-11-28 ENCOUNTER — Other Ambulatory Visit: Payer: Self-pay

## 2021-11-28 ENCOUNTER — Other Ambulatory Visit: Payer: Self-pay | Admitting: Nurse Practitioner

## 2021-11-28 DIAGNOSIS — G5602 Carpal tunnel syndrome, left upper limb: Secondary | ICD-10-CM | POA: Insufficient documentation

## 2021-11-28 MED ORDER — PANTOPRAZOLE SODIUM 40 MG PO TBEC: 40.0000 mg | DELAYED_RELEASE_TABLET | Freq: Every day | ORAL | 1 refills | Status: DC

## 2021-11-28 NOTE — Progress Notes (Signed)
? ?Office Visit Note ?  ?Patient: [name]           ?Date of Birth: [birth year]           ?MRN: [id number] ?Visit Date: 11/25/2021 ?             ?Requested by: Minette Brine, [place] ?[address] ?[address] ?[place],   [id number] ?PCP: Minette Brine, FNP ? ? ?Assessment & Plan: ?Visit Diagnoses:  ?1. Carpal tunnel syndrome, left upper limb   ? ? ?Plan: We reviewed her electrodiagnostic studies which demonstrate a moderate left median nerve entrapment at the wrist.  Her symptoms are unchanged from previous.  We again discussed the treatment options for carpal tunnel syndrome including both conservative and surgical treatment.  She would like to proceed with surgical treatment.  We discussed risks of carpal tunnel release which include but not limited to bleeding, infection, damage to neurovascular structures, incomplete symptom relief, need for additional surgery.  A surgical date will be confirmed with the patient. ? ?Follow-Up Instructions: No follow-ups on file.  ? ?Orders:  ?No orders of the defined types were placed in this encounter. ? ?No orders of the defined types were placed in this encounter. ? ? ? ? Procedures: ?No procedures performed ? ? ?Clinical Data: ?No additional findings. ? ? ?Subjective: ?Chief Complaint  ?Patient presents with  ? Left Hand - Follow-up  ?  EMG/NCS review  ? ? ?Is a 46 year old right-hand-dominant female presents today to review her electrodiagnostic studies.  She reports continued numbness and paresthesias involving the left hand.  She still having nocturnal symptoms several nights per week.  She has failed conservative management with night splints.  She had previous right carpal tunnel release for similar symptoms with significant relief. ? ? ?Review of Systems ? ? ?Objective: ?Vital Signs: LMP 09/17/2010  ? ?Physical Exam ?Constitutional:   ?   Appearance: Normal appearance.  ?Cardiovascular:  ?   Rate and Rhythm: Normal rate.  ?   Pulses: Normal pulses.  ?Pulmonary:   ?   Effort: Pulmonary effort is normal.  ?Skin: ?   General: Skin is warm and dry.  ?   Capillary Refill: Capillary refill takes less than 2 seconds.  ?Neurological:  ?   Mental Status: She is alert.  ? ? ?Left Hand Exam  ? ?Tenderness  ?The patient is experiencing no tenderness.  ? ?Range of Motion  ?The patient has normal left wrist ROM. ? ?Other  ?Erythema: absent ?Sensation: normal ?Pulse: present ? ?Comments:  Positive Tinel and Phalen signs at wrist.  4/5 thenar motor strength compared to contralateral side but without atrophy.   ? ? ? ? ?Specialty Comments:  ?No specialty comments available. ? ?Imaging: ?No results found. ? ? ?PMFS History: ?Patient Active Problem List  ? Diagnosis Date Noted  ? Carpal tunnel syndrome, left upper limb 11/28/2021  ? Numbness and tingling in left hand 10/11/2021  ? Abdominal cramping 09/26/2021  ? GAD (generalized anxiety disorder) 08/30/2017  ? Migraine without aura 08/10/2015  ? Ovarian cancer ([place]) 08/10/2015  ? Spinal stenosis, lumbar 01/14/2015  ? Carpal tunnel syndrome 12/12/2013  ? HYPERLIPIDEMIA 12/05/2007  ? HYPERTENSION 12/05/2007  ? ATRIAL FIBRILLATION, PAROXYSMAL 12/05/2007  ? BRADYCARDIA 12/05/2007  ? DEGENERATIVE JOINT DISEASE 12/05/2007  ? ?Past Medical History:  ?Diagnosis Date  ? Anxiety   ? Cancer [facility])   ? ovarian cancer  ? Carpal tunnel syndrome of right wrist 11/2013  ? Depression   ? Ganglion  cyst of wrist 11/2013  ? right  ? Hypertension   ? is on 3 meds. - states BP is under control; has been on med. x 12 yr.  ? Migraines   ? Right tennis elbow 11/2013  ?  ?Family History  ?Problem Relation Age of Onset  ? Hypertension Mother   ? Diabetes Mother   ? Cancer Mother   ? Diabetes Brother   ? Hypertension Brother   ? Diabetes Brother   ? Hypertension Maternal Grandmother   ? Heart attack Paternal Grandmother   ? Cancer Other   ?  ?Past Surgical History:  ?Procedure Laterality Date  ? ABDOMINAL HYSTERECTOMY    ? complete  ? CARPAL TUNNEL RELEASE Right 12/12/2013   ? Procedure: ENDOSCOPY RIGHT WRIST CARPAL TUNNEL RELEASE EXCISION GANGLION WRIST PRIMARY ;  Surgeon: Renette Butters, MD;  Location: [place];  Service: Orthopedics;  Laterality: Right;  ? CESAREAN SECTION  03/11/2005; 04/05/2008  ? CHOLECYSTECTOMY  02/11/2004  ? lap. chole.  ? LATERAL EPICONDYLE RELEASE Right 12/12/2013  ? Procedure: RIGHT TENNIS ELBOW RELEASE WITH BONE REPAIR;  Surgeon: Renette Butters, MD;  Location: [place];  Service: Orthopedics;  Laterality: Right;  ? LUMBAR LAMINECTOMY WITH COFLEX 1 LEVEL Bilateral 01/14/2015  ? Procedure: Laminectomy - bilateral - L4-L5 ;  Surgeon: Karie Chimera, MD;  Location: [place] [facility];  Service: Neurosurgery;  Laterality: Bilateral;  Laminectomy - bilateral - L4-L5   ? LUMBAR WOUND DEBRIDEMENT N/A 02/02/2015  ? Procedure: LUMBAR WOUND DEBRIDEMENT, WASHOUT, CLOSURE;  Surgeon: Consuella Lose, MD;  Location: [place] [facility];  Service: Neurosurgery;  Laterality: N/A;  lumbar wound debridement  ? TUBAL LIGATION  04/05/2008  ? ?Social History  ? ?Occupational History  ? Not on file  ?Tobacco Use  ? Smoking status: Never  ? Smokeless tobacco: Never  ?Vaping Use  ? Vaping Use: Never used  ?Substance and Sexual Activity  ? Alcohol use: No  ? Drug use: No  ? Sexual activity: Not on file  ? ? ? ? ? ? ?

## 2022-01-16 ENCOUNTER — Encounter (HOSPITAL_COMMUNITY): Payer: Self-pay

## 2022-01-16 ENCOUNTER — Ambulatory Visit (HOSPITAL_COMMUNITY): Payer: Medicaid Other | Admitting: Clinical

## 2022-01-30 ENCOUNTER — Encounter: Payer: Self-pay | Admitting: Nurse Practitioner

## 2022-01-30 ENCOUNTER — Ambulatory Visit (INDEPENDENT_AMBULATORY_CARE_PROVIDER_SITE_OTHER): Payer: Medicaid Other | Admitting: Nurse Practitioner

## 2022-01-30 VITALS — BP 110/68 | HR 68 | Temp 98.1°F | Ht 60.0 in | Wt 241.6 lb

## 2022-01-30 DIAGNOSIS — F320 Major depressive disorder, single episode, mild: Secondary | ICD-10-CM | POA: Diagnosis not present

## 2022-01-30 DIAGNOSIS — Z6841 Body Mass Index (BMI) 40.0 and over, adult: Secondary | ICD-10-CM | POA: Diagnosis not present

## 2022-01-30 DIAGNOSIS — Z139 Encounter for screening, unspecified: Secondary | ICD-10-CM

## 2022-01-30 DIAGNOSIS — I10 Essential (primary) hypertension: Secondary | ICD-10-CM

## 2022-01-30 DIAGNOSIS — Z23 Encounter for immunization: Secondary | ICD-10-CM

## 2022-01-30 DIAGNOSIS — Z8543 Personal history of malignant neoplasm of ovary: Secondary | ICD-10-CM

## 2022-01-30 DIAGNOSIS — Z1159 Encounter for screening for other viral diseases: Secondary | ICD-10-CM

## 2022-01-30 NOTE — Progress Notes (Signed)
I,Tianna Badgett,acting as a Education administrator for [employer], FNP.,have documented all relevant documentation on the behalf of Minette Brine, FNP,as directed by  Minette Brine, FNP while in the presence of Minette Brine, FNP  Subjective:     Patient ID: [name] , female    DOB: 12-16-1975 , 46 y.o.   MRN: [id number]   Chief Complaint  Patient presents with   Hypertension    HPI  Patient presents today for HTN follow up. Patient is wanting to know more about getting a shot for Hep A when getting in water because she is going on vacation.  She is going to [employer] for weight management she has one appt left before surgery (gastric sleeve).   Wt Readings from Last 3 Encounters: 01/30/22 : 241 lb 9.6 oz (109.6 kg) 10/11/21 : 250 lb (113.4 kg) 09/27/21 : 250 lb (113.4 kg)  She is eating better and exercising 3 times a week.   Hypertension This is a chronic problem. The current episode started more than 1 year ago. The problem is controlled. Pertinent negatives include no anxiety or headaches. Risk factors for coronary artery disease include obesity and sedentary lifestyle. Past treatments include diuretics, calcium channel blockers and angiotensin blockers. There are no compliance problems.  There is no history of angina. There is no history of chronic renal disease.     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 - states BP is under control; has been on med. x 12 yr.   Migraines    Right tennis elbow 11/2013     Family History  Problem Relation Age of Onset   Hypertension Mother    Diabetes Mother    Cancer Mother    Diabetes Brother    Hypertension Brother    Diabetes Brother    Hypertension Maternal Grandmother    Heart attack Paternal Grandmother    Cancer Other      Current Outpatient  Medications:    aspirin-acetaminophen-caffeine (EXCEDRIN MIGRAINE) 250-250-65 MG tablet, Take 2 tablets by mouth daily as needed for migraine., Disp: , Rfl:    estradiol (ESTRACE) 2 MG tablet, Take 2 mg by mouth daily., Disp: , Rfl:    hydrochlorothiazide (HYDRODIURIL) 25 MG tablet, Take 1 tablet (25 mg total) by mouth every morning., Disp: 90 tablet, Rfl: 1   ibuprofen (ADVIL) 200 MG tablet, Take by mouth., Disp: , Rfl:    losartan (COZAAR) 100 MG tablet, Take 1 tablet (100 mg total) by mouth daily., Disp: 90 tablet, Rfl: 1   pantoprazole (PROTONIX) 40 MG tablet, Take 1 tablet (40 mg total) by mouth daily., Disp: 90 tablet, Rfl: 1   rizatriptan (MAXALT) 10 MG tablet, TK 1 T PO  AT ONSET  AND  MAY REPEAT IN 2 HOURS IF NEEDED, Disp: , Rfl: 5   amLODipine (NORVASC) 10 MG tablet, Take 1 tablet (10 mg total) by mouth daily., Disp: 90 tablet, Rfl: 0   traMADol (ULTRAM) 50 MG tablet, Take 1 tablet (50 mg total) by mouth every 6 (six) hours as needed. (Patient not taking: Reported on 01/30/2022), Disp: 15 tablet, Rfl: 0   Vitamin D, Ergocalciferol, (DRISDOL) 1.25 MG (50000 UNIT) CAPS capsule, Take 1 capsule (50,000 Units total) by mouth every 7 (  seven) days. (Patient not taking: Reported on 01/30/2022), Disp: 12 capsule, Rfl: 0   Allergies  Allergen Reactions   Venlafaxine Anaphylaxis and Nausea Only    Insomnia; Depression Insomnia; Depression      Review of Systems  Constitutional: Negative.   Respiratory: Negative.    Cardiovascular: Negative.   Gastrointestinal: Negative.   Neurological: Negative.  Negative for headaches.     Today's Vitals   01/30/22 1513  BP: 110/68  Pulse: 68  Temp: 98.1 F (36.7 C)  TempSrc: Oral  Weight: 241 lb 9.6 oz (109.6 kg)  Height: 5' (1.524 m)  PainSc: 0-No pain   Body mass index is 47.18 kg/m. Wt Readings from Last 3 Encounters:  01/30/22 241 lb 9.6 oz (109.6 kg)  10/11/21 250 lb (113.4 kg)  09/27/21 250 lb (113.4 kg)     Objective:  Physical  Exam Vitals reviewed.  Constitutional:      Appearance: Normal appearance.  Cardiovascular:     Rate and Rhythm: Normal rate and regular rhythm.     Pulses: Normal pulses.     Heart sounds: Normal heart sounds. No murmur heard. Neurological:     General: No focal deficit present.     Mental Status: She is alert and oriented to person, place, and time.     Cranial Nerves: No cranial nerve deficit.     Motor: No weakness.  Psychiatric:        Mood and Affect: Mood normal.        Behavior: Behavior normal.        Thought Content: Thought content normal.        Judgment: Judgment normal.         Assessment And Plan:     1. Essential hypertension Comments: Blood pressure is well controlled, continue current medication.  - BMP8+eGFR  2. Current mild episode of major depressive disorder, unspecified whether recurrent ([place]) Comments: Declines medications at this time  3. Encounter for hepatitis C screening test for low risk patient Will check Hepatitis C screening due to recent recommendations to screen all adults 18 years and older - Hepatitis C antibody  4. Class 3 severe obesity due to excess calories with body mass index (BMI) of 45.0 to 49.9 in adult, unspecified whether serious comorbidity present (HCC) Chronic Discussed healthy diet and regular exercise options  Encouraged to exercise at least 150 minutes per week with 2 days of strength training  5. History of ovarian cancer  6. Encounter for screening Comments: Reports having Hepatitis A in the past, will check titer - Hepatitis A antibody, total    Patient was given opportunity to ask questions. Patient verbalized understanding of the plan and was able to repeat key elements of the plan. All questions were answered to their satisfaction.  Minette Brine, FNP   I, Minette Brine, FNP, have reviewed all documentation for this visit. The documentation on 01/30/22 for the exam, diagnosis, procedures, and orders are all  accurate and complete.   IF YOU HAVE BEEN REFERRED TO A SPECIALIST, IT MAY TAKE 1-2 WEEKS TO SCHEDULE/PROCESS THE REFERRAL. IF YOU HAVE NOT HEARD FROM US/SPECIALIST IN TWO WEEKS, PLEASE GIVE [place] A CALL AT [phone].   THE PATIENT IS ENCOURAGED TO PRACTICE SOCIAL DISTANCING DUE TO THE COVID-19 PANDEMIC.

## 2022-01-30 NOTE — Patient Instructions (Signed)
Hypertension, Adult High blood pressure (hypertension) is when the force of blood pumping through the arteries is too strong. The arteries are the blood vessels that carry blood from the heart throughout the body. Hypertension forces the heart to work harder to pump blood and may cause arteries to become narrow or stiff. Untreated or uncontrolled hypertension can lead to a heart attack, heart failure, a stroke, kidney disease, and other problems. A blood pressure reading consists of a higher number over a lower number. Ideally, your blood pressure should be below 120/80. The first ("top") number is called the systolic pressure. It is a measure of the pressure in your arteries as your heart beats. The second ("bottom") number is called the diastolic pressure. It is a measure of the pressure in your arteries as the heart relaxes. What are the causes? The exact cause of this condition is not known. There are some conditions that result in high blood pressure. What increases the risk? Certain factors may make you more likely to develop high blood pressure. Some of these risk factors are under your control, including: Smoking. Not getting enough exercise or physical activity. Being overweight. Having too much fat, sugar, calories, or salt (sodium) in your diet. Drinking too much alcohol. Other risk factors include: Having a personal history of heart disease, diabetes, high cholesterol, or kidney disease. Stress. Having a family history of high blood pressure and high cholesterol. Having obstructive sleep apnea. Age. The risk increases with age. What are the signs or symptoms? High blood pressure may not cause symptoms. Very high blood pressure (hypertensive crisis) may cause: Headache. Fast or irregular heartbeats (palpitations). Shortness of breath. Nosebleed. Nausea and vomiting. Vision changes. Severe chest pain, dizziness, and seizures. How is this diagnosed? This condition is diagnosed by  measuring your blood pressure while you are seated, with your arm resting on a flat surface, your legs uncrossed, and your feet flat on the floor. The cuff of the blood pressure monitor will be placed directly against the skin of your upper arm at the level of your heart. Blood pressure should be measured at least twice using the same arm. Certain conditions can cause a difference in blood pressure between your right and left arms. If you have a high blood pressure reading during one visit or you have normal blood pressure with other risk factors, you may be asked to: Return on a different day to have your blood pressure checked again. Monitor your blood pressure at home for 1 week or longer. If you are diagnosed with hypertension, you may have other blood or imaging tests to help your health care provider understand your overall risk for other conditions. How is this treated? This condition is treated by making healthy lifestyle changes, such as eating healthy foods, exercising more, and reducing your alcohol intake. You may be referred for counseling on a healthy diet and physical activity. Your health care provider may prescribe medicine if lifestyle changes are not enough to get your blood pressure under control and if: Your systolic blood pressure is above 130. Your diastolic blood pressure is above 80. Your personal target blood pressure may vary depending on your medical conditions, your age, and other factors. Follow these instructions at home: Eating and drinking  Eat a diet that is high in fiber and potassium, and low in sodium, added sugar, and fat. An example of this eating plan is called the DASH diet. DASH stands for Dietary Approaches to Stop Hypertension. To eat this way: Eat   plenty of fresh fruits and vegetables. Try to fill one half of your plate at each meal with fruits and vegetables. Eat whole grains, such as whole-wheat pasta, brown rice, or whole-grain bread. Fill about one  fourth of your plate with whole grains. Eat or drink low-fat dairy products, such as skim milk or low-fat yogurt. Avoid fatty cuts of meat, processed or cured meats, and poultry with skin. Fill about one fourth of your plate with lean proteins, such as fish, chicken without skin, beans, eggs, or tofu. Avoid pre-made and processed foods. These tend to be higher in sodium, added sugar, and fat. Reduce your daily sodium intake. Many people with hypertension should eat less than 1,500 mg of sodium a day. Do not drink alcohol if: Your health care provider tells you not to drink. You are pregnant, may be pregnant, or are planning to become pregnant. If you drink alcohol: Limit how much you have to: 0-1 drink a day for women. 0-2 drinks a day for men. Know how much alcohol is in your drink. In the [place] one drink equals one 12 oz bottle of beer (355 mL), one 5 oz glass of wine (148 mL), or one 1 oz glass of hard liquor (44 mL). Lifestyle  Work with your health care provider to maintain a healthy body weight or to lose weight. Ask what an ideal weight is for you. Get at least 30 minutes of exercise that causes your heart to beat faster (aerobic exercise) most days of the week. Activities may include walking, swimming, or biking. Include exercise to strengthen your muscles (resistance exercise), such as Pilates or lifting weights, as part of your weekly exercise routine. Try to do these types of exercises for 30 minutes at least 3 days a week. Do not use any products that contain nicotine or tobacco. These products include cigarettes, chewing tobacco, and vaping devices, such as e-cigarettes. If you need help quitting, ask your health care provider. Monitor your blood pressure at home as told by your health care provider. Keep all follow-up visits. This is important. Medicines Take over-the-counter and prescription medicines only as told by your health care provider. Follow directions carefully. Blood  pressure medicines must be taken as prescribed. Do not skip doses of blood pressure medicine. Doing this puts you at risk for problems and can make the medicine less effective. Ask your health care provider about side effects or reactions to medicines that you should watch for. Contact a health care provider if you: Think you are having a reaction to a medicine you are taking. Have headaches that keep coming back (recurring). Feel dizzy. Have swelling in your ankles. Have trouble with your vision. Get help right away if you: Develop a severe headache or confusion. Have unusual weakness or numbness. Feel faint. Have severe pain in your chest or abdomen. Vomit repeatedly. Have trouble breathing. These symptoms may be an emergency. Get help right away. Call 911. Do not wait to see if the symptoms will go away. Do not drive yourself to the hospital. Summary Hypertension is when the force of blood pumping through your arteries is too strong. If this condition is not controlled, it may put you at risk for serious complications. Your personal target blood pressure may vary depending on your medical conditions, your age, and other factors. For most people, a normal blood pressure is less than 120/80. Hypertension is treated with lifestyle changes, medicines, or a combination of both. Lifestyle changes include losing weight, eating a healthy,   low-sodium diet, exercising more, and limiting alcohol. This information is not intended to replace advice given to you by your health care provider. Make sure you discuss any questions you have with your health care provider. Document Revised: 05/17/2021 Document Reviewed: 05/17/2021 Elsevier Patient Education  2023 Elsevier Inc.  

## 2022-01-31 LAB — BMP8+EGFR
BUN/Creatinine Ratio: 20 (ref 9–23)
BUN: 12 mg/dL (ref 6–24)
CO2: 26 mmol/L (ref 20–29)
Calcium: 9.7 mg/dL (ref 8.7–10.2)
Chloride: 101 mmol/L (ref 96–106)
Creatinine, Ser: 0.61 mg/dL (ref 0.57–1.00)
Glucose: 120 mg/dL — ABNORMAL HIGH (ref 70–99)
Potassium: 3.6 mmol/L (ref 3.5–5.2)
Sodium: 141 mmol/L (ref 134–144)
eGFR: 113 mL/min/{1.73_m2} (ref >59)

## 2022-01-31 LAB — HEPATITIS C ANTIBODY: Hep C Virus Ab: NONREACTIVE

## 2022-01-31 LAB — HEPATITIS A ANTIBODY, TOTAL: hep A Total Ab: POSITIVE — AB

## 2022-02-23 ENCOUNTER — Other Ambulatory Visit: Payer: Self-pay

## 2022-02-23 MED ORDER — AMLODIPINE BESYLATE 10 MG PO TABS: 10.0000 mg | ORAL_TABLET | Freq: Every day | ORAL | 0 refills | Status: DC

## 2022-03-02 ENCOUNTER — Other Ambulatory Visit: Payer: Self-pay

## 2022-03-02 ENCOUNTER — Emergency Department (HOSPITAL_BASED_OUTPATIENT_CLINIC_OR_DEPARTMENT_OTHER): Payer: Medicaid Other

## 2022-03-02 ENCOUNTER — Encounter (HOSPITAL_BASED_OUTPATIENT_CLINIC_OR_DEPARTMENT_OTHER): Payer: Self-pay

## 2022-03-02 ENCOUNTER — Ambulatory Visit
Admission: EM | Admit: 2022-03-02 | Discharge: 2022-03-02 | Disposition: A | Discharge status: Other Acute Inpatient Hospital | Payer: Medicaid Other | Attending: Physician Assistant | Admitting: Physician Assistant

## 2022-03-02 ENCOUNTER — Emergency Department (HOSPITAL_BASED_OUTPATIENT_CLINIC_OR_DEPARTMENT_OTHER)
Admission: EM | Admit: 2022-03-02 | Discharge: 2022-03-02 | Disposition: A | Discharge status: Home / Self Care | Payer: Medicaid Other | Attending: Emergency Medicine | Admitting: Emergency Medicine

## 2022-03-02 DIAGNOSIS — R079 Chest pain, unspecified: Secondary | ICD-10-CM | POA: Diagnosis present

## 2022-03-02 DIAGNOSIS — Z7982 Long term (current) use of aspirin: Secondary | ICD-10-CM | POA: Diagnosis not present

## 2022-03-02 DIAGNOSIS — R0789 Other chest pain: Secondary | ICD-10-CM | POA: Insufficient documentation

## 2022-03-02 LAB — TROPONIN I (HIGH SENSITIVITY)
Troponin I (High Sensitivity): 10 ng/L (ref <18)
Troponin I (High Sensitivity): 9 ng/L (ref <18)

## 2022-03-02 LAB — D-DIMER, QUANTITATIVE: D-Dimer, Quant: <0.27 ug/mL-FEU (ref 0.00–0.50)

## 2022-03-02 LAB — COMPREHENSIVE METABOLIC PANEL
ALT: 42 U/L (ref 0–44)
AST: 31 U/L (ref 15–41)
Albumin: 4 g/dL (ref 3.5–5.0)
Alkaline Phosphatase: 78 U/L (ref 38–126)
Anion gap: 11 (ref 5–15)
BUN: 15 mg/dL (ref 6–20)
CO2: 28 mmol/L (ref 22–32)
Calcium: 9.8 mg/dL (ref 8.9–10.3)
Chloride: 100 mmol/L (ref 98–111)
Creatinine, Ser: 0.67 mg/dL (ref 0.44–1.00)
GFR, Estimated: >60 mL/min (ref >60)
Glucose, Bld: 92 mg/dL (ref 70–99)
Potassium: 3.2 mmol/L — ABNORMAL LOW (ref 3.5–5.1)
Sodium: 139 mmol/L (ref 135–145)
Total Bilirubin: 0.5 mg/dL (ref 0.3–1.2)
Total Protein: 8.2 g/dL — ABNORMAL HIGH (ref 6.5–8.1)

## 2022-03-02 LAB — CBC WITH DIFFERENTIAL/PLATELET
Abs Immature Granulocytes: 0.02 10*3/uL (ref 0.00–0.07)
Basophils Absolute: 0.1 10*3/uL (ref 0.0–0.1)
Basophils Relative: 1 %
Eosinophils Absolute: 0.2 10*3/uL (ref 0.0–0.5)
Eosinophils Relative: 3 %
HCT: 43.2 % (ref 36.0–46.0)
Hemoglobin: 14.2 g/dL (ref 12.0–15.0)
Immature Granulocytes: 0 %
Lymphocytes Relative: 40 %
Lymphs Abs: 3.6 10*3/uL (ref 0.7–4.0)
MCH: 26.8 pg (ref 26.0–34.0)
MCHC: 32.9 g/dL (ref 30.0–36.0)
MCV: 81.7 fL (ref 80.0–100.0)
Monocytes Absolute: 0.9 10*3/uL (ref 0.1–1.0)
Monocytes Relative: 10 %
Neutro Abs: 4.1 10*3/uL (ref 1.7–7.7)
Neutrophils Relative %: 46 %
Platelets: 240 10*3/uL (ref 150–400)
RBC: 5.29 MIL/uL — ABNORMAL HIGH (ref 3.87–5.11)
RDW: 14 % (ref 11.5–15.5)
WBC: 9 10*3/uL (ref 4.0–10.5)
nRBC: 0 % (ref 0.0–0.2)

## 2022-03-02 LAB — LIPASE, BLOOD: Lipase: 50 U/L (ref 11–51)

## 2022-03-02 MED ORDER — ACETAMINOPHEN 325 MG PO TABS
650.0000 mg | ORAL_TABLET | Freq: Once | ORAL | Status: AC
  Administered 2022-03-02: 650 mg via ORAL
  Filled 2022-03-02: qty 2

## 2022-03-02 NOTE — ED Triage Notes (Signed)
Pt presents to uc with pain to chest for one week. Pt reports no otc medication.  Pt does have cardiac history and htn

## 2022-03-02 NOTE — ED Notes (Signed)
Patient is being discharged from the Urgent Care and sent to the Emergency Department via pov . Per rebecca myers, pa, patient is in need of higher level of care due to ekg needed unable to preform it here. Patient is aware and verbalizes understanding of plan of care.  Vitals:   03/02/22 1736  BP: (!) 142/90  Pulse: 69  Resp: 19  Temp: 97.7 F (36.5 C)  SpO2: 98%

## 2022-03-02 NOTE — ED Notes (Signed)
Reviewed AVS/discharge instruction with patient. Time allotted for and all questions answered. Patient is agreeable for d/c and escorted to ed exit by staff.  

## 2022-03-02 NOTE — ED Triage Notes (Signed)
Patient here POV from UC.  Endorses CP that began approximately 1 Week ago and has worsened today. Mainly Constant and Left Chest Oriented.   No SOB. No Fevers. No N/V/D.   Sent by UC due to Equipment Failure with EKG Machine.   NAD Noted during Triage. A&Ox4. GCS 15. Ambulatory.

## 2022-03-02 NOTE — ED Provider Notes (Signed)
[name] Provider Note   CSN: [id number] Arrival date & time: 03/02/22  1826     History  Chief Complaint  Patient presents with   Chest Pain    [name] is a 46 y.o. female.  Is here with some left-sided chest pain fairly constant over the last week or so.  Denies any shortness of breath, nausea, vomiting, diarrhea.  She is on estrogen.  History of ovarian cancer in remission.  History of high blood pressure.  History of anxiety and depression.  Denies any infectious symptoms.  Denies any abdominal pain, shortness of breath, weakness, numbness.  Nothing makes it worse or better.  No significant cardiac history in the family.  No recent surgery or travel.  The history is provided by the patient.       Home Medications Prior to Admission medications   Medication Sig Start Date End Date Taking? Authorizing Provider  amLODipine (NORVASC) 10 MG tablet Take 1 tablet (10 mg total) by mouth daily. 02/23/22   Minette Brine, FNP  aspirin-acetaminophen-caffeine (EXCEDRIN MIGRAINE) [phone] MG tablet Take 2 tablets by mouth daily as needed for migraine.    [provider]  estradiol (ESTRACE) 2 MG tablet Take 2 mg by mouth daily. 03/11/21   [provider]  hydrochlorothiazide (HYDRODIURIL) 25 MG tablet Take 1 tablet (25 mg total) by mouth every morning. 09/27/21   Minette Brine, FNP  ibuprofen (ADVIL) 200 MG tablet Take by mouth.    [provider]  losartan (COZAAR) 100 MG tablet Take 1 tablet (100 mg total) by mouth daily. 09/27/21   Minette Brine, FNP  pantoprazole (PROTONIX) 40 MG tablet Take 1 tablet (40 mg total) by mouth daily. 11/28/21 11/28/22  Minette Brine, FNP  rizatriptan (MAXALT) 10 MG tablet TK 1 T PO  AT ONSET  AND  MAY REPEAT IN 2 HOURS IF NEEDED 11/06/17   [provider]  traMADol (ULTRAM) 50 MG tablet Take 1 tablet (50 mg total) by mouth every 6 (six) hours as needed. Patient not taking: Reported on  01/30/2022 08/04/21   Hans Eden, NP  Vitamin D, Ergocalciferol, (DRISDOL) 1.25 MG (50000 UNIT) CAPS capsule Take 1 capsule (50,000 Units total) by mouth every 7 (seven) days. Patient not taking: Reported on 01/30/2022 08/02/21   Bary Castilla, NP      Allergies    Venlafaxine    Review of Systems   Review of Systems  Physical Exam Updated Vital Signs BP 113/65   Pulse 63   Temp (!) 96.8 F (36 C) (Temporal)   Resp (!) 27   Ht 5' (1.524 m)   Wt 109.6 kg   LMP 09/17/2010   SpO2 97%   BMI 47.19 kg/m  Physical Exam Vitals and nursing note reviewed.  Constitutional:      General: She is not in acute distress.    Appearance: She is well-developed. She is not ill-appearing.  HENT:     Head: Normocephalic and atraumatic.  Eyes:     Extraocular Movements: Extraocular movements intact.     Conjunctiva/sclera: Conjunctivae normal.     Pupils: Pupils are equal, round, and reactive to light.  Cardiovascular:     Rate and Rhythm: Normal rate and regular rhythm.     Pulses:          Radial pulses are 2+ on the right side and 2+ on the left side.     Heart sounds: Normal heart sounds. No murmur heard. Pulmonary:  Effort: Pulmonary effort is normal. No respiratory distress.     Breath sounds: Normal breath sounds. No decreased breath sounds.  Chest:     Chest wall: Tenderness present.  Abdominal:     Palpations: Abdomen is soft.     Tenderness: There is no abdominal tenderness.  Musculoskeletal:        General: No swelling.     Cervical back: Normal range of motion and neck supple.     Right lower leg: No edema.     Left lower leg: No edema.  Skin:    General: Skin is warm and dry.     Capillary Refill: Capillary refill takes less than 2 seconds.  Neurological:     Mental Status: She is alert.  Psychiatric:        Mood and Affect: Mood normal.     ED Results / Procedures / Treatments   Labs (all labs ordered are listed, but only abnormal results are  displayed) Labs Reviewed  CBC WITH DIFFERENTIAL/PLATELET - Abnormal; Notable for the following components:      Result Value   RBC 5.29 (*)    All other components within normal limits  COMPREHENSIVE METABOLIC PANEL - Abnormal; Notable for the following components:   Potassium 3.2 (*)    Total Protein 8.2 (*)    All other components within normal limits  LIPASE, BLOOD  D-DIMER, QUANTITATIVE  TROPONIN I (HIGH SENSITIVITY)  TROPONIN I (HIGH SENSITIVITY)    EKG None  Radiology DG Chest Portable 1 View  Result Date: 03/02/2022 CLINICAL DATA:  Chest pain. EXAM: PORTABLE CHEST 1 VIEW COMPARISON:  07/30/2013 FINDINGS: The cardiomediastinal contours are normal. The lungs are clear. Pulmonary vasculature is normal. No consolidation, pleural effusion, or pneumothorax. No acute osseous abnormalities are seen. IMPRESSION: No acute chest findings. Electronically Signed   By: Keith Rake M.D.   On: 03/02/2022 19:01    Procedures Procedures    Medications Ordered in ED Medications  acetaminophen (TYLENOL) tablet 650 mg (650 mg Oral Given 03/02/22 2037)    ED Course/ Medical Decision Making/ A&P                           Medical Decision Making Amount and/or Complexity of Data Reviewed Labs: ordered. Radiology: ordered.  Risk OTC drugs.   [name] is here with chest pain.  Normal vitals.  No fever.  EKG per my review and interpretation shows sinus rhythm.  No ischemic changes.  Heart score of 1.  She is on estrogen.  Will get D-dimer.  Will get CBC, CMP, lipase, D-dimer, troponin, chest x-ray.  Differential diagnosis is likely MSK pain or GI pain.  Overall atypical story.  Will evaluate for ACS, PE, pneumonia but these are thought to be less likely.  Somewhat reproducible pain on exam.  Per my review and interpretation labs has no significant anemia, electrolyte abnormality, kidney injury.  D-dimer is normal.  Troponin negative x 2.  Overall suspect this is MSK or stress  related.  Will have her follow-up with her primary care doctor.  Discharged in good condition.  This chart was dictated using voice recognition software.  Despite best efforts to proofread,  errors can occur which can change the documentation meaning.         Final Clinical Impression(s) / ED Diagnoses Final diagnoses:  Atypical chest pain    Rx / DC Orders ED Discharge Orders     None  Lennice Sites, DO 03/02/22 2042

## 2022-03-02 NOTE — ED Provider Notes (Signed)
Patient here today for evaluation of chest pain that has been gradually worsening over the week.  She notes the pain is present to the left side of her chest and into her left neck area and has been persistent.  She denies any known injury.  She has not had shortness of breath but does have cardiac history.  Unfortunately we do not have operating EKG machine in office today and recommended further evaluation in the emergency department.  Patient is agreeable to same.   Francene Finders, PA-C 03/02/22 1805

## 2022-03-02 NOTE — ED Notes (Signed)
Patient ambulated to restroom, NAD

## 2022-03-06 ENCOUNTER — Ambulatory Visit (INDEPENDENT_AMBULATORY_CARE_PROVIDER_SITE_OTHER): Payer: Medicaid Other | Admitting: Nurse Practitioner

## 2022-03-06 VITALS — BP 128/60 | HR 76 | Temp 98.2°F | Ht 60.0 in | Wt 237.0 lb

## 2022-03-06 DIAGNOSIS — R0789 Other chest pain: Secondary | ICD-10-CM

## 2022-03-06 DIAGNOSIS — E876 Hypokalemia: Secondary | ICD-10-CM | POA: Diagnosis not present

## 2022-03-06 DIAGNOSIS — Z6841 Body Mass Index (BMI) 40.0 and over, adult: Secondary | ICD-10-CM

## 2022-03-06 DIAGNOSIS — I10 Essential (primary) hypertension: Secondary | ICD-10-CM | POA: Diagnosis not present

## 2022-03-06 DIAGNOSIS — N6321 Unspecified lump in the left breast, upper outer quadrant: Secondary | ICD-10-CM

## 2022-03-06 MED ORDER — POTASSIUM CHLORIDE CRYS ER 10 MEQ PO TBCR: 10.0000 meq | EXTENDED_RELEASE_TABLET | Freq: Two times a day (BID) | ORAL | 2 refills | Status: DC

## 2022-03-06 NOTE — Patient Instructions (Signed)
   Chest Wall Pain Chest wall pain is pain in or around the bones and muscles of your chest. Chest wall pain may be caused by: An injury. Coughing a lot. Using your chest and arm muscles too much. Sometimes, the cause may not be known. This pain may take a few weeks or longer to get better. Follow these instructions at home: Managing pain, stiffness, and swelling If told, put ice on the painful area: Put ice in a plastic bag. Place a towel between your skin and the bag. Leave the ice on for 20 minutes, 2-3 times a day.  Activity Rest as told by your doctor. Avoid doing things that cause pain. This includes lifting heavy items. Ask your doctor what activities are safe for you. General instructions  Take over-the-counter and prescription medicines only as told by your doctor. Do not use any products that contain nicotine or tobacco, such as cigarettes, e-cigarettes, and chewing tobacco. If you need help quitting, ask your doctor. Keep all follow-up visits as told by your doctor. This is important. Contact a doctor if: You have a fever. Your chest pain gets worse. You have new symptoms. Get help right away if: You feel sick to your stomach (nauseous) or you throw up (vomit). You feel sweaty or light-headed. You have a cough with mucus from your lungs (sputum) or you cough up blood. You are short of breath. These symptoms may be an emergency. Do not wait to see if the symptoms will go away. Get medical help right away. Call your local emergency services (911 in the [place] Do not drive yourself to the hospital. Summary Chest wall pain is pain in or around the bones and muscles of your chest. It may be treated with ice, rest, and medicines. Your condition may also get better if you avoid doing things that cause pain. Contact a doctor if you have a fever, chest pain that gets worse, or new symptoms. Get help right away if you feel light-headed or you get short of breath. These symptoms  may be an emergency. This information is not intended to replace advice given to you by your health care provider. Make sure you discuss any questions you have with your health care provider. Document Revised: 10/12/2020 Document Reviewed: 09/24/2020 Elsevier Patient Education  2023 [employer] Inc.  

## 2022-03-06 NOTE — Progress Notes (Signed)
I,Tianna Badgett,acting as a Education administrator for [employer], FNP.,have documented all relevant documentation on the behalf of Minette Brine, FNP,as directed by  Minette Brine, FNP while in the presence of Minette Brine, [place].  Subjective:     Patient ID: [name] , female    DOB: [birth year] , 46 y.o.   MRN: [id number]   Chief Complaint  Patient presents with   Follow-up    HPI  Patient presents today for ED follow up for left chest pain, that had started 1.5 weeks prior. Pain was present all the time. Continues to have pain now, no changes with position changes. She has taken tylenol several times. He is on estradiol from the [facility] at the gyn since about 2015. She had a hysterectomy due to ovarian cancer. Last seen in January or February. Denies any physical pulling or lifting. Dull pain to left chest. She did take tylenol daily last week without relief.   She has seen Dr. Shelly Bombard in 2021 in [place], due to having similar. She had a stress test and Xio patch which both were normal.      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 - states BP is under control; has been on med. x 12 yr.   Migraines    Right tennis elbow 11/2013     Family History  Problem Relation Age of Onset   Hypertension Mother    Diabetes Mother    Cancer Mother    Diabetes Brother    Hypertension Brother    Diabetes Brother    Hypertension Maternal Grandmother    Heart attack Paternal Grandmother    Cancer Other      Current Outpatient Medications:    potassium chloride (KLOR-CON M) 10 MEQ tablet, Take 1 tablet (10 mEq total) by mouth 2 (two) times daily., Disp: 30 tablet, Rfl: 2   amLODipine (NORVASC) 10 MG tablet, Take 1 tablet (10 mg total) by mouth daily., Disp: 90 tablet, Rfl: 0   aspirin-acetaminophen-caffeine  (EXCEDRIN MIGRAINE) 250-250-65 MG tablet, Take 2 tablets by mouth daily as needed for migraine., Disp: , Rfl:    estradiol (ESTRACE) 2 MG tablet, Take 2 mg by mouth daily., Disp: , Rfl:    hydrochlorothiazide (HYDRODIURIL) 25 MG tablet, Take 1 tablet (25 mg total) by mouth every morning., Disp: 90 tablet, Rfl: 1   ibuprofen (ADVIL) 200 MG tablet, Take by mouth., Disp: , Rfl:    losartan (COZAAR) 100 MG tablet, Take 1 tablet (100 mg total) by mouth daily., Disp: 90 tablet, Rfl: 1   pantoprazole (PROTONIX) 40 MG tablet, Take 1 tablet (40 mg total) by mouth daily., Disp: 90 tablet, Rfl: 1   rizatriptan (MAXALT) 10 MG tablet, TK 1 T PO  AT ONSET  AND  MAY REPEAT IN 2 HOURS IF NEEDED, Disp: , Rfl: 5   Allergies  Allergen Reactions   Venlafaxine Anaphylaxis and Nausea Only    Insomnia; Depression Insomnia; Depression      Review of Systems  Constitutional: Negative.  Negative for fatigue.  Respiratory: Negative.  Negative for chest tightness, shortness of breath and wheezing.   Cardiovascular:  Positive for chest pain (dull). Negative for palpitations and leg swelling.  Gastrointestinal: Negative.   Neurological:  Negative.   Psychiatric/Behavioral: Negative.       Today's Vitals   03/06/22 1039  BP: 128/60  Pulse: 76  Temp: 98.2 F (36.8 C)  TempSrc: Oral  Weight: 237 lb (107.5 kg)  Height: 5' (1.524 m)   Body mass index is 46.29 kg/m.  Wt Readings from Last 3 Encounters:  03/06/22 237 lb (107.5 kg)  03/02/22 241 lb 10 oz (109.6 kg)  01/30/22 241 lb 9.6 oz (109.6 kg)    Objective:  Physical Exam Vitals reviewed.  Constitutional:      General: She is not in acute distress.    Appearance: Normal appearance.  Cardiovascular:     Rate and Rhythm: Normal rate and regular rhythm.     Pulses: Normal pulses.     Heart sounds: Normal heart sounds. No murmur heard. Chest:     Chest wall: No mass.  Breasts:    Right: Normal. No swelling, mass or tenderness.     Left: Mass  (left breast at 12 oclock tenderness) and tenderness present. No swelling.  Neurological:     General: No focal deficit present.     Mental Status: She is alert and oriented to person, place, and time.     Cranial Nerves: No cranial nerve deficit.     Motor: No weakness.  Psychiatric:        Mood and Affect: Mood normal.        Behavior: Behavior normal.        Thought Content: Thought content normal.        Judgment: Judgment normal.         Assessment And Plan:     1. Atypical chest pain Comments: Seen in ER with EKG which was normal.  - MM DIAG BREAST TOMO BILATERAL; Future  2. Class 3 severe obesity due to excess calories with body mass index (BMI) of 45.0 to 49.9 in adult, unspecified whether serious comorbidity present [facility]) She is encouraged to strive for BMI less than 30 to decrease cardiac risk. Advised to aim for at least 150 minutes of exercise per week.  3. Hypokalemia Comments:  she is on HCTZ and has a history of low potassium - potassium chloride (KLOR-CON M) 10 MEQ tablet; Take 1 tablet (10 mEq total) by mouth 2 (two) times daily.  Dispense: 30 tablet; Refill: 2  4. Essential hypertension Comments: Blood pressure is well controlled, continue current medications - potassium chloride (KLOR-CON M) 10 MEQ tablet; Take 1 tablet (10 mEq total) by mouth 2 (two) times daily.  Dispense: 30 tablet; Refill: 2  5. Mass of upper outer quadrant of left breast Comments: at 12 oclock with tenderness.      Patient was given opportunity to ask questions. Patient verbalized understanding of the plan and was able to repeat key elements of the plan. All questions were answered to their satisfaction.  Minette Brine, FNP    I, Minette Brine, FNP, have reviewed all documentation for this visit. The documentation on 03/06/22 for the exam, diagnosis, procedures, and orders are all accurate and complete.  IF YOU HAVE BEEN REFERRED TO A SPECIALIST, IT MAY TAKE 1-2 WEEKS TO  SCHEDULE/PROCESS THE REFERRAL. IF YOU HAVE NOT HEARD FROM US/SPECIALIST IN TWO WEEKS, PLEASE GIVE [place] A CALL AT [phone].   THE PATIENT IS ENCOURAGED TO PRACTICE SOCIAL DISTANCING DUE TO THE COVID-19 PANDEMIC.

## 2022-03-08 ENCOUNTER — Other Ambulatory Visit: Payer: Self-pay | Admitting: Nurse Practitioner

## 2022-03-08 DIAGNOSIS — R0789 Other chest pain: Secondary | ICD-10-CM

## 2022-03-12 ENCOUNTER — Encounter: Payer: Self-pay | Admitting: Nurse Practitioner

## 2022-03-16 ENCOUNTER — Other Ambulatory Visit: Payer: Self-pay | Admitting: Nurse Practitioner

## 2022-03-16 ENCOUNTER — Ambulatory Visit
Admission: RE | Admit: 2022-03-16 | Discharge: 2022-03-16 | Disposition: A | Discharge status: Home / Self Care | Payer: Medicaid Other | Source: Ambulatory Visit | Attending: Nurse Practitioner | Admitting: Nurse Practitioner

## 2022-03-16 ENCOUNTER — Ambulatory Visit: Payer: Medicaid Other

## 2022-03-16 DIAGNOSIS — R0789 Other chest pain: Secondary | ICD-10-CM

## 2022-04-10 ENCOUNTER — Other Ambulatory Visit: Payer: Self-pay

## 2022-04-10 DIAGNOSIS — I10 Essential (primary) hypertension: Secondary | ICD-10-CM

## 2022-04-10 MED ORDER — HYDROCHLOROTHIAZIDE 25 MG PO TABS: 25.0000 mg | ORAL_TABLET | Freq: Every morning | ORAL | 1 refills | Status: DC

## 2022-05-03 ENCOUNTER — Ambulatory Visit (INDEPENDENT_AMBULATORY_CARE_PROVIDER_SITE_OTHER): Payer: Medicaid Other | Admitting: Nurse Practitioner

## 2022-05-03 ENCOUNTER — Encounter: Payer: Self-pay | Admitting: Nurse Practitioner

## 2022-05-03 VITALS — BP 138/80 | HR 85 | Temp 98.2°F | Ht 60.0 in | Wt 238.0 lb

## 2022-05-03 DIAGNOSIS — R7303 Prediabetes: Secondary | ICD-10-CM | POA: Diagnosis not present

## 2022-05-03 DIAGNOSIS — Z6841 Body Mass Index (BMI) 40.0 and over, adult: Secondary | ICD-10-CM | POA: Diagnosis not present

## 2022-05-03 DIAGNOSIS — Z23 Encounter for immunization: Secondary | ICD-10-CM

## 2022-05-03 DIAGNOSIS — Z111 Encounter for screening for respiratory tuberculosis: Secondary | ICD-10-CM

## 2022-05-03 DIAGNOSIS — I10 Essential (primary) hypertension: Secondary | ICD-10-CM

## 2022-05-03 NOTE — Patient Instructions (Addendum)
Influenza Virus Vaccine injection What is this medication? INFLUENZA VIRUS VACCINE (in floo EN zuh VAHY ruhs vak SEEN) helps to reduce the risk of getting influenza also known as the flu. The vaccine only helps protect you against some strains of the flu. This medicine may be used for other purposes; ask your health care provider or pharmacist if you have questions. COMMON BRAND NAME(S): Afluria, Afluria Quadrivalent, Agriflu, Alfuria, FLUAD, FLUAD Quadrivalent, Fluarix, Fluarix Quadrivalent, Flublok, Flublok Quadrivalent, FLUCELVAX, FLUCELVAX Quadrivalent, Flulaval, Flulaval Quadrivalent, Fluvirin, Fluzone, Fluzone High-Dose, Fluzone Intradermal, Fluzone Quadrivalent What should I tell my care team before I take this medication? They need to know if you have any of these conditions: bleeding disorder like hemophilia fever or infection Guillain-Barre syndrome or other neurological problems immune system problems infection with the human immunodeficiency virus (HIV) or AIDS low blood platelet counts multiple sclerosis an unusual or allergic reaction to influenza virus vaccine, latex, other medicines, foods, dyes, or preservatives. Different brands of vaccines contain different allergens. Some may contain latex or eggs. Talk to your doctor about your allergies to make sure that you get the right vaccine. pregnant or trying to get pregnant breast-feeding How should I use this medication? This vaccine is for injection into a muscle or under the skin. It is given by a health care professional. A copy of Vaccine Information Statements will be given before each vaccination. Read this sheet carefully each time. The sheet may change frequently. Talk to your healthcare provider to see which vaccines are right for you. Some vaccines should not be used in all age groups. Overdosage: If you think you have taken too much of this medicine contact a poison control center or emergency room at once. NOTE: This  medicine is only for you. Do not share this medicine with others. What if I miss a dose? This does not apply. What may interact with this medication? chemotherapy or radiation therapy medicines that lower your immune system like etanercept, anakinra, infliximab, and adalimumab medicines that treat or prevent blood clots like warfarin phenytoin steroid medicines like prednisone or cortisone theophylline vaccines This list may not describe all possible interactions. Give your health care provider a list of all the medicines, herbs, non-prescription drugs, or dietary supplements you use. Also tell them if you smoke, drink alcohol, or use illegal drugs. Some items may interact with your medicine. What should I watch for while using this medication? Report any side effects that do not go away within 3 days to your doctor or health care professional. Call your health care provider if any unusual symptoms occur within 6 weeks of receiving this vaccine. You may still catch the flu, but the illness is not usually as bad. You cannot get the flu from the vaccine. The vaccine will not protect against colds or other illnesses that may cause fever. The vaccine is needed every year. What side effects may I notice from receiving this medication? Side effects that you should report to your doctor or health care professional as soon as possible: allergic reactions like skin rash, itching or hives, swelling of the face, lips, or tongue Side effects that usually do not require medical attention (report to your doctor or health care professional if they continue or are bothersome): fever headache muscle aches and pains pain, tenderness, redness, or swelling at the injection site tiredness This list may not describe all possible side effects. Call your doctor for medical advice about side effects. You may report side effects to FDA at 1-800-FDA-1088.   Where should I keep my medication? The vaccine will be given  by a health care professional in a clinic, pharmacy, doctor's office, or other health care setting. You will not be given vaccine doses to store at home. NOTE: This sheet is a summary. It may not cover all possible information. If you have questions about this medicine, talk to your doctor, pharmacist, or health care provider.  2023 Elsevier/Gold Standard (2021-02-11 00:00:00) Hypertension, Adult High blood pressure (hypertension) is when the force of blood pumping through the arteries is too strong. The arteries are the blood vessels that carry blood from the heart throughout the body. Hypertension forces the heart to work harder to pump blood and may cause arteries to become narrow or stiff. Untreated or uncontrolled hypertension can lead to a heart attack, heart failure, a stroke, kidney disease, and other problems. A blood pressure reading consists of a higher number over a lower number. Ideally, your blood pressure should be below 120/80. The first ("top") number is called the systolic pressure. It is a measure of the pressure in your arteries as your heart beats. The second ("bottom") number is called the diastolic pressure. It is a measure of the pressure in your arteries as the heart relaxes. What are the causes? The exact cause of this condition is not known. There are some conditions that result in high blood pressure. What increases the risk? Certain factors may make you more likely to develop high blood pressure. Some of these risk factors are under your control, including: Smoking. Not getting enough exercise or physical activity. Being overweight. Having too much fat, sugar, calories, or salt (sodium) in your diet. Drinking too much alcohol. Other risk factors include: Having a personal history of heart disease, diabetes, high cholesterol, or kidney disease. Stress. Having a family history of high blood pressure and high cholesterol. Having obstructive sleep apnea. Age. The risk  increases with age. What are the signs or symptoms? High blood pressure may not cause symptoms. Very high blood pressure (hypertensive crisis) may cause: Headache. Fast or irregular heartbeats (palpitations). Shortness of breath. Nosebleed. Nausea and vomiting. Vision changes. Severe chest pain, dizziness, and seizures. How is this diagnosed? This condition is diagnosed by measuring your blood pressure while you are seated, with your arm resting on a flat surface, your legs uncrossed, and your feet flat on the floor. The cuff of the blood pressure monitor will be placed directly against the skin of your upper arm at the level of your heart. Blood pressure should be measured at least twice using the same arm. Certain conditions can cause a difference in blood pressure between your right and left arms. If you have a high blood pressure reading during one visit or you have normal blood pressure with other risk factors, you may be asked to: Return on a different day to have your blood pressure checked again. Monitor your blood pressure at home for 1 week or longer. If you are diagnosed with hypertension, you may have other blood or imaging tests to help your health care provider understand your overall risk for other conditions. How is this treated? This condition is treated by making healthy lifestyle changes, such as eating healthy foods, exercising more, and reducing your alcohol intake. You may be referred for counseling on a healthy diet and physical activity. Your health care provider may prescribe medicine if lifestyle changes are not enough to get your blood pressure under control and if: Your systolic blood pressure is above 130. Your  diastolic blood pressure is above 80. Your personal target blood pressure may vary depending on your medical conditions, your age, and other factors. Follow these instructions at home: Eating and drinking  Eat a diet that is high in fiber and potassium,  and low in sodium, added sugar, and fat. An example of this eating plan is called the DASH diet. DASH stands for Dietary Approaches to Stop Hypertension. To eat this way: Eat plenty of fresh fruits and vegetables. Try to fill one half of your plate at each meal with fruits and vegetables. Eat whole grains, such as whole-wheat pasta, brown rice, or whole-grain bread. Fill about one fourth of your plate with whole grains. Eat or drink low-fat dairy products, such as skim milk or low-fat yogurt. Avoid fatty cuts of meat, processed or cured meats, and poultry with skin. Fill about one fourth of your plate with lean proteins, such as fish, chicken without skin, beans, eggs, or tofu. Avoid pre-made and processed foods. These tend to be higher in sodium, added sugar, and fat. Reduce your daily sodium intake. Many people with hypertension should eat less than 1,500 mg of sodium a day. Do not drink alcohol if: Your health care provider tells you not to drink. You are pregnant, may be pregnant, or are planning to become pregnant. If you drink alcohol: Limit how much you have to: 0-1 drink a day for women. 0-2 drinks a day for men. Know how much alcohol is in your drink. In the [place] one drink equals one 12 oz bottle of beer (355 mL), one 5 oz glass of wine (148 mL), or one 1 oz glass of hard liquor (44 mL). Lifestyle  Work with your health care provider to maintain a healthy body weight or to lose weight. Ask what an ideal weight is for you. Get at least 30 minutes of exercise that causes your heart to beat faster (aerobic exercise) most days of the week. Activities may include walking, swimming, or biking. Include exercise to strengthen your muscles (resistance exercise), such as Pilates or lifting weights, as part of your weekly exercise routine. Try to do these types of exercises for 30 minutes at least 3 days a week. Do not use any products that contain nicotine or tobacco. These products include  cigarettes, chewing tobacco, and vaping devices, such as e-cigarettes. If you need help quitting, ask your health care provider. Monitor your blood pressure at home as told by your health care provider. Keep all follow-up visits. This is important. Medicines Take over-the-counter and prescription medicines only as told by your health care provider. Follow directions carefully. Blood pressure medicines must be taken as prescribed. Do not skip doses of blood pressure medicine. Doing this puts you at risk for problems and can make the medicine less effective. Ask your health care provider about side effects or reactions to medicines that you should watch for. Contact a health care provider if you: Think you are having a reaction to a medicine you are taking. Have headaches that keep coming back (recurring). Feel dizzy. Have swelling in your ankles. Have trouble with your vision. Get help right away if you: Develop a severe headache or confusion. Have unusual weakness or numbness. Feel faint. Have severe pain in your chest or abdomen. Vomit repeatedly. Have trouble breathing. These symptoms may be an emergency. Get help right away. Call 911. Do not wait to see if the symptoms will go away. Do not drive yourself to the hospital. Summary Hypertension is when  the force of blood pumping through your arteries is too strong. If this condition is not controlled, it may put you at risk for serious complications. Your personal target blood pressure may vary depending on your medical conditions, your age, and other factors. For most people, a normal blood pressure is less than 120/80. Hypertension is treated with lifestyle changes, medicines, or a combination of both. Lifestyle changes include losing weight, eating a healthy, low-sodium diet, exercising more, and limiting alcohol. This information is not intended to replace advice given to you by your health care provider. Make sure you discuss any  questions you have with your health care provider. Document Revised: 05/17/2021 Document Reviewed: 05/17/2021 Elsevier Patient Education  [place].

## 2022-05-03 NOTE — Progress Notes (Signed)
I,Tianna Badgett,acting as a Neurosurgeon for [employer], FNP.,have documented all relevant documentation on the behalf of Arnette Felts, FNP,as directed by  Arnette Felts, FNP while in the presence of Arnette Felts, FNP.  Subjective:     Patient ID: [name] , female    DOB: 1976/01/16 , 46 y.o.   MRN: [id number]   Chief Complaint  Patient presents with   Hypertension    HPI  Patient presents today for HTN follow up.  She has been walking more, she needs a quantiferon for work  [employer] from Last 3 Encounters: 05/03/22 : 238 lb (108 kg) 03/06/22 : 237 lb (107.5 kg) 03/02/22 : 241 lb 10 oz (109.6 kg)  She has seen Cardiology since her last visit. No changes.  She had an abnormal stress test and will have further testing. She is seeing Dr. Judithe Modest Cardiology with [facility].   She is scheduled to have gastric sleeve in November - she is down 20 lbs. She is having done at [facility].   Hypertension This is a chronic problem. The current episode started more than 1 year ago. The problem is controlled. Pertinent negatives include no anxiety or headaches. Risk factors for coronary artery disease include obesity and sedentary lifestyle. Past treatments include diuretics, calcium channel blockers and angiotensin blockers. There are no compliance problems.  There is no history of angina. There is no history of chronic renal disease.     Past Medical History:  Diagnosis Date   Anxiety    Cancer [facility])    ovarian cancer   Carpal tunnel syndrome of right wrist 11/2013   Depression    Ganglion cyst of wrist 11/2013   right   Hypertension    is on 3 meds. - states BP is under control; has been on med. x 12 yr.   Migraines    Right tennis elbow 11/2013     Family History  Problem Relation Age of Onset   Hypertension Mother    Diabetes Mother    Cancer Mother    Diabetes Brother    Hypertension Brother    Diabetes Brother    Hypertension Maternal Grandmother    Heart attack  Paternal Grandmother    Cancer Other      Current Outpatient Medications:    amLODipine (NORVASC) 10 MG tablet, Take 1 tablet (10 mg total) by mouth daily., Disp: 90 tablet, Rfl: 0   aspirin-acetaminophen-caffeine (EXCEDRIN MIGRAINE) 250-250-65 MG tablet, Take 2 tablets by mouth daily as needed for migraine., Disp: , Rfl:    estradiol (ESTRACE) 2 MG tablet, Take 2 mg by mouth daily., Disp: , Rfl:    hydrochlorothiazide (HYDRODIURIL) 25 MG tablet, Take 1 tablet (25 mg total) by mouth every morning., Disp: 90 tablet, Rfl: 1   ibuprofen (ADVIL) 200 MG tablet, Take by mouth., Disp: , Rfl:    losartan (COZAAR) 100 MG tablet, Take 1 tablet (100 mg total) by mouth daily., Disp: 90 tablet, Rfl: 1   pantoprazole (PROTONIX) 40 MG tablet, Take 1 tablet (40 mg total) by mouth daily., Disp: 90 tablet, Rfl: 1   potassium chloride (KLOR-CON M) 10 MEQ tablet, Take 1 tablet (10 mEq total) by mouth 2 (two) times daily., Disp: 30 tablet, Rfl: 2   rizatriptan (MAXALT) 10 MG tablet, TK 1 T PO  AT ONSET  AND  MAY REPEAT IN 2 HOURS IF NEEDED, Disp: , Rfl: 5   Allergies  Allergen Reactions   Venlafaxine Anaphylaxis and Nausea Only  Insomnia; Depression Insomnia; Depression      Review of Systems  Constitutional: Negative.   Respiratory: Negative.    Cardiovascular: Negative.   Gastrointestinal: Negative.   Neurological: Negative.  Negative for headaches.     Today's Vitals   05/03/22 1553  BP: 138/80  Pulse: 85  Temp: 98.2 F (36.8 C)  TempSrc: Oral  Weight: 238 lb (108 kg)  Height: 5' (1.524 m)   Body mass index is 46.48 kg/m.  Wt Readings from Last 3 Encounters:  05/03/22 238 lb (108 kg)  03/06/22 237 lb (107.5 kg)  03/02/22 241 lb 10 oz (109.6 kg)    Objective:  Physical Exam Vitals reviewed.  Constitutional:      General: She is not in acute distress.    Appearance: Normal appearance. She is obese.  Cardiovascular:     Rate and Rhythm: Normal rate and regular rhythm.      Pulses: Normal pulses.     Heart sounds: Normal heart sounds. No murmur heard. Pulmonary:     Effort: Pulmonary effort is normal. No respiratory distress.     Breath sounds: Normal breath sounds. No wheezing.  Skin:    General: Skin is warm and dry.     Capillary Refill: Capillary refill takes less than 2 seconds.  Neurological:     General: No focal deficit present.     Mental Status: She is alert and oriented to person, place, and time.     Cranial Nerves: No cranial nerve deficit.     Motor: No weakness.  Psychiatric:        Mood and Affect: Mood normal.        Behavior: Behavior normal.        Thought Content: Thought content normal.        Judgment: Judgment normal.         Assessment And Plan:     1. Essential hypertension Comments: Blood pressure is fairly controlled. Continue current medications - BMP8+eGFR  2. Prediabetes Comments: HgbA1c is stable, continue focusing on healthy diet low in sugar and carbs. Encouraged to exercise regularly.  - Hemoglobin A1c  3. Need for influenza vaccination Influenza vaccine administered Encouraged to take Tylenol as needed for fever or muscle aches. - Flu Vaccine QUAD 6+ mos PF IM (Fluarix Quad PF)  4. Need for Tdap vaccination Will give tetanus vaccine today while in office. Refer to order management. TDAP will be administered to adults 47-40 years old every 10 years. - Tdap vaccine greater than or equal to 7yo IM  5. Screening for tuberculosis - QuantiFERON-TB Gold Plus  6. Class 3 severe obesity due to excess calories with body mass index (BMI) of 45.0 to 49.9 in adult, unspecified whether serious comorbidity present [facility]) She is encouraged to strive for BMI less than 30 to decrease cardiac risk. Advised to aim for at least 150 minutes of exercise per week.    Patient was given opportunity to ask questions. Patient verbalized understanding of the plan and was able to repeat key elements of the plan. All questions were  answered to their satisfaction.  Minette Brine, FNP   I, Minette Brine, FNP, have reviewed all documentation for this visit. The documentation on 05/03/22 for the exam, diagnosis, procedures, and orders are all accurate and complete.   IF YOU HAVE BEEN REFERRED TO A SPECIALIST, IT MAY TAKE 1-2 WEEKS TO SCHEDULE/PROCESS THE REFERRAL. IF YOU HAVE NOT HEARD FROM US/SPECIALIST IN TWO WEEKS, PLEASE GIVE [place] A CALL AT  [phone].   THE PATIENT IS ENCOURAGED TO PRACTICE SOCIAL DISTANCING DUE TO THE COVID-19 PANDEMIC.

## 2022-05-06 LAB — BMP8+EGFR
BUN/Creatinine Ratio: 22 (ref 9–23)
BUN: 16 mg/dL (ref 6–24)
CO2: 20 mmol/L (ref 20–29)
Calcium: 9.8 mg/dL (ref 8.7–10.2)
Chloride: 98 mmol/L (ref 96–106)
Creatinine, Ser: 0.72 mg/dL (ref 0.57–1.00)
Glucose: 97 mg/dL (ref 70–99)
Potassium: 3.5 mmol/L (ref 3.5–5.2)
Sodium: 138 mmol/L (ref 134–144)
eGFR: 106 mL/min/{1.73_m2} (ref >59)

## 2022-05-06 LAB — QUANTIFERON-TB GOLD PLUS
QuantiFERON Mitogen Value: >10 IU/mL
QuantiFERON Nil Value: 0.05 IU/mL
QuantiFERON TB1 Ag Value: 0.5 IU/mL
QuantiFERON TB2 Ag Value: 0.47 IU/mL
QuantiFERON-TB Gold Plus: POSITIVE — AB

## 2022-05-06 LAB — HEMOGLOBIN A1C
Est. average glucose Bld gHb Est-mCnc: 120 mg/dL
Hgb A1c MFr Bld: 5.8 % — ABNORMAL HIGH (ref 4.8–5.6)

## 2022-05-08 ENCOUNTER — Encounter: Payer: Self-pay | Admitting: Nurse Practitioner

## 2022-05-09 ENCOUNTER — Ambulatory Visit
Admission: EM | Admit: 2022-05-09 | Discharge: 2022-05-09 | Disposition: A | Discharge status: Home / Self Care | Payer: Medicaid Other | Attending: Internal Medicine | Admitting: Internal Medicine

## 2022-05-09 ENCOUNTER — Ambulatory Visit (INDEPENDENT_AMBULATORY_CARE_PROVIDER_SITE_OTHER): Payer: Medicaid Other

## 2022-05-09 DIAGNOSIS — Z111 Encounter for screening for respiratory tuberculosis: Secondary | ICD-10-CM

## 2022-05-09 NOTE — ED Provider Notes (Signed)
[facility]    CSN: [id number] Arrival date & time: 05/09/22  1626      History   Chief Complaint Chief Complaint  Patient presents with   abnormal blood work     HPI [name] is a 46 y.o. female.   Patient presents to receive a chest x-ray after having a positive QuantiFERON gold TB test at her PCP.  This was performed on 05/03/2022 for work purposes, and her PCP told her to get a chest x-ray to confirm.  Patient denies any associated symptoms including cough, night sweats, fever.  Denies any exposure to TB as well.     Past Medical History:  Diagnosis Date   Anxiety    Cancer [facility])    ovarian cancer   Carpal tunnel syndrome of right wrist 11/2013   Depression    Ganglion cyst of wrist 11/2013   right   Hypertension    is on 3 meds. - states BP is under control; has been on med. x 12 yr.   Migraines    Right tennis elbow 11/2013    Patient Active Problem List   Diagnosis Date Noted   Current mild episode of major depressive disorder, unspecified whether recurrent ([place]) 01/30/2022   Carpal tunnel syndrome, left upper limb 11/28/2021   Numbness and tingling in left hand 10/11/2021   Abdominal cramping 09/26/2021   GAD (generalized anxiety disorder) 08/30/2017   Migraine without aura 08/10/2015   Spinal stenosis, lumbar 01/14/2015   Carpal tunnel syndrome 12/12/2013   HYPERLIPIDEMIA 12/05/2007   HYPERTENSION 12/05/2007   BRADYCARDIA 12/05/2007   DEGENERATIVE JOINT DISEASE 12/05/2007    Past Surgical History:  Procedure Laterality Date   ABDOMINAL HYSTERECTOMY     complete   CARPAL TUNNEL RELEASE Right 12/12/2013   Procedure: ENDOSCOPY RIGHT WRIST CARPAL TUNNEL RELEASE EXCISION GANGLION WRIST PRIMARY ;  Surgeon: Renette Butters, MD;  Location: [place];  Service: Orthopedics;  Laterality: Right;   CESAREAN SECTION  03/11/2005; 04/05/2008   CHOLECYSTECTOMY  02/11/2004   lap. chole.   LATERAL EPICONDYLE RELEASE Right  12/12/2013   Procedure: RIGHT TENNIS ELBOW RELEASE WITH BONE REPAIR;  Surgeon: Renette Butters, MD;  Location: [place];  Service: Orthopedics;  Laterality: Right;   LUMBAR LAMINECTOMY WITH COFLEX 1 LEVEL Bilateral 01/14/2015   Procedure: Laminectomy - bilateral - L4-L5 ;  Surgeon: Karie Chimera, MD;  Location: [place] [facility];  Service: Neurosurgery;  Laterality: Bilateral;  Laminectomy - bilateral - L4-L5    LUMBAR WOUND DEBRIDEMENT N/A 02/02/2015   Procedure: LUMBAR WOUND DEBRIDEMENT, WASHOUT, CLOSURE;  Surgeon: Consuella Lose, MD;  Location: [place] [facility];  Service: Neurosurgery;  Laterality: N/A;  lumbar wound debridement   TUBAL LIGATION  04/05/2008    OB History   No obstetric history on file.      Home Medications    Prior to Admission medications   Medication Sig Start Date End Date Taking? Authorizing Provider  amLODipine (NORVASC) 10 MG tablet Take 1 tablet (10 mg total) by mouth daily. 02/23/22   Minette Brine, FNP  aspirin-acetaminophen-caffeine (EXCEDRIN MIGRAINE) [phone] MG tablet Take 2 tablets by mouth daily as needed for migraine.    [provider]  estradiol (ESTRACE) 2 MG tablet Take 2 mg by mouth daily. 03/11/21   [provider]  hydrochlorothiazide (HYDRODIURIL) 25 MG tablet Take 1 tablet (25 mg total) by mouth every morning. 04/10/22   Minette Brine, FNP  ibuprofen (ADVIL) 200 MG tablet  Take by mouth.    [provider]  losartan (COZAAR) 100 MG tablet Take 1 tablet (100 mg total) by mouth daily. 09/27/21   Minette Brine, FNP  pantoprazole (PROTONIX) 40 MG tablet Take 1 tablet (40 mg total) by mouth daily. 11/28/21 11/28/22  Minette Brine, FNP  potassium chloride (KLOR-CON M) 10 MEQ tablet Take 1 tablet (10 mEq total) by mouth 2 (two) times daily. 03/06/22   Minette Brine, FNP  rizatriptan (MAXALT) 10 MG tablet TK 1 T PO  AT ONSET  AND  MAY REPEAT IN 2 HOURS IF NEEDED 11/06/17   [provider]    Family History Family  History  Problem Relation Age of Onset   Hypertension Mother    Diabetes Mother    Cancer Mother    Diabetes Brother    Hypertension Brother    Diabetes Brother    Hypertension Maternal Grandmother    Heart attack Paternal Grandmother    Cancer Other     Social History Social History   Tobacco Use   Smoking status: Never   Smokeless tobacco: Never  Vaping Use   Vaping Use: Never used  Substance Use Topics   Alcohol use: No   Drug use: No     Allergies   Venlafaxine   Review of Systems Review of Systems Per HPI  Physical Exam Triage Vital Signs ED Triage Vitals  Enc Vitals Group     BP 05/09/22 1709 135/83     Pulse Rate 05/09/22 1709 79     Resp 05/09/22 1709 18     Temp 05/09/22 1709 98 F (36.7 C)     Temp src --      SpO2 05/09/22 1709 98 %     Weight --      Height --      Head Circumference --      Peak Flow --      Pain Score 05/09/22 1712 0     Pain Loc --      Pain Edu? --      Excl. in [place]? --    No data found.  Updated Vital Signs BP 135/83   Pulse 79   Temp 98 F (36.7 C)   Resp 18   LMP 09/17/2010   SpO2 98%   Visual Acuity Right Eye Distance:   Left Eye Distance:   Bilateral Distance:    Right Eye Near:   Left Eye Near:    Bilateral Near:     Physical Exam Constitutional:      General: She is not in acute distress.    Appearance: Normal appearance. She is not toxic-appearing or diaphoretic.  HENT:     Head: Normocephalic and atraumatic.  Eyes:     Extraocular Movements: Extraocular movements intact.     Conjunctiva/sclera: Conjunctivae normal.  Cardiovascular:     Rate and Rhythm: Normal rate and regular rhythm.     Pulses: Normal pulses.     Heart sounds: Normal heart sounds.  Pulmonary:     Effort: Pulmonary effort is normal. No respiratory distress.     Breath sounds: Normal breath sounds. No stridor. No wheezing, rhonchi or rales.  Neurological:     General: No focal deficit present.     Mental Status: She is  alert and oriented to person, place, and time. Mental status is at baseline.  Psychiatric:        Mood and Affect: Mood normal.        Behavior:  Behavior normal.        Thought Content: Thought content normal.        Judgment: Judgment normal.      UC Treatments / Results  Labs (all labs ordered are listed, but only abnormal results are displayed) Labs Reviewed - No data to display  EKG   Radiology DG Chest 2 View  Result Date: 05/09/2022 CLINICAL DATA:  Positive TB test EXAM: CHEST - 2 VIEW COMPARISON:  03/02/2022 FINDINGS: A mild pectus excavatum deformity. Surgical clips in the upper abdomen. Midline trachea. Normal heart size and mediastinal contours. No pleural effusion or pneumothorax. Clear lungs. IMPRESSION: No acute cardiopulmonary disease. Electronically Signed   By: Abigail Miyamoto M.D.   On: 05/09/2022 17:38    Procedures Procedures (including critical care time)  Medications Ordered in UC Medications - No data to display  Initial Impression / Assessment and Plan / UC Course  I have reviewed the triage vital signs and the nursing notes.  Pertinent labs & imaging results that were available during my care of the patient were reviewed by me and considered in my medical decision making (see chart for details).     Chest x-ray was normal.  No current concern for pulmonary TB.  Advised patient to follow-up with PCP for any further evaluation or suggestions.  Patient verbalized understanding and was agreeable with plan. Final Clinical Impressions(s) / UC Diagnoses   Final diagnoses:  Normal screening chest x-ray for tuberculosis     Discharge Instructions      Your chest x-ray was normal.  Please follow-up with your primary care doctor.     ED Prescriptions   None    PDMP not reviewed this encounter.   Teodora Medici, [place] 05/09/22 [phone]

## 2022-05-09 NOTE — Discharge Instructions (Signed)
Your chest x-ray was normal.  Please follow-up with your primary care doctor.

## 2022-05-09 NOTE — ED Triage Notes (Signed)
Pt presents to uc with co of abnormal blood tb test ad need for chest xray

## 2022-05-24 ENCOUNTER — Ambulatory Visit (INDEPENDENT_AMBULATORY_CARE_PROVIDER_SITE_OTHER): Payer: Medicaid Other | Admitting: Orthopaedic Surgery

## 2022-05-24 ENCOUNTER — Ambulatory Visit (INDEPENDENT_AMBULATORY_CARE_PROVIDER_SITE_OTHER): Payer: Medicaid Other

## 2022-05-24 ENCOUNTER — Encounter: Payer: Self-pay | Admitting: Orthopaedic Surgery

## 2022-05-24 DIAGNOSIS — M542 Cervicalgia: Secondary | ICD-10-CM

## 2022-05-24 DIAGNOSIS — G5602 Carpal tunnel syndrome, left upper limb: Secondary | ICD-10-CM

## 2022-05-24 MED ORDER — METHOCARBAMOL 750 MG PO TABS: 750.0000 mg | ORAL_TABLET | Freq: Two times a day (BID) | ORAL | 0 refills | Status: DC | PRN

## 2022-05-24 MED ORDER — DICLOFENAC SODIUM 75 MG PO TBEC: 75.0000 mg | DELAYED_RELEASE_TABLET | Freq: Two times a day (BID) | ORAL | 2 refills | Status: DC | PRN

## 2022-05-24 NOTE — Progress Notes (Signed)
Office Visit Note   Patient: [name]           Date of Birth: [birth year]           MRN: [id number] Visit Date: 05/24/2022              Requested by: Minette Brine, [address] [place],  [place] [address] PCP: Minette Brine, FNP   Assessment & Plan: Visit Diagnoses:  1. Carpal tunnel syndrome of left wrist   2. Neck pain     Plan: Impression cervicalgia and left carpal tunnel syndrome moderate in severity.  Believe the patient's symptoms are coming from both her neck and her carpal tunnel syndrome.  For her neck, we have discussed starting her on an anti-inflammatory and sending her to physical therapy.  She is agreeable to this plan.  For her carpal tunnel syndrome, she would like to go ahead and proceed with carpal tunnel release.  Risk, benefits and possible complications reviewed.  Rehab and recovery time discussed.  All questions were answered.  Follow-Up Instructions: Return for post-op.   Orders:  Orders Placed This Encounter  Procedures   XR Cervical Spine 2 or 3 views   Ambulatory referral to Physical Therapy   Meds ordered this encounter  Medications   diclofenac (VOLTAREN) 75 MG EC tablet    Sig: Take 1 tablet (75 mg total) by mouth 2 (two) times daily as needed.    Dispense:  60 tablet    Refill:  2   methocarbamol (ROBAXIN-750) 750 MG tablet    Sig: Take 1 tablet (750 mg total) by mouth 2 (two) times daily as needed for muscle spasms.    Dispense:  20 tablet    Refill:  0      Procedures: No procedures performed   Clinical Data: No additional findings.   Subjective: Chief Complaint  Patient presents with   Left Wrist - Pain   Neck - Pain    HPI patient is a pleasant 46 year old female who comes in today with left hand paresthesias.  She has previously been seen by Dr. Tempie Donning for this where nerve conduction study was obtained which showed moderate compression median nerve.  She had failed conservative treatment options to  include night splinting and was awaiting a call to schedule surgery.  She is here today as she never heard from the surgery scheduler.  She is now also complaining of bilateral neck pain radiating to both shoulder blades and occasionally down the left arm.  Movement of her neck seems to worsen her symptoms.  No previous history of cervical spine pathology.  Review of Systems as detailed in HPI.  All others reviewed and are negative.   Objective: Vital Signs: LMP 09/17/2010   Physical Exam well-developed well-nourished female in no acute distress.  Alert and oriented x3.  Ortho Exam left wrist exam shows a positive Tinel.  Negative Phalen.  No thenar atrophy.  She is neurovascular tact distally.  Cervical spine exam reveals mild spinous and bilateral paraspinous musculature tenderness.  Increased pain with flexion, extension or rotation.  Tenderness in the both parascapular regions.  No focal weakness.  Specialty Comments:  No specialty comments available.  Imaging: XR Cervical Spine 2 or 3 views  Result Date: 05/24/2022 X-rays demonstrate moderate degenerative changes C5-6 C6-7 with straightening of the cervical spine    PMFS History: Patient Active Problem List   Diagnosis Date Noted   Current mild episode of major depressive disorder,  unspecified whether recurrent ([place]) 01/30/2022   Carpal tunnel syndrome, left upper limb 11/28/2021   Numbness and tingling in left hand 10/11/2021   Abdominal cramping 09/26/2021   GAD (generalized anxiety disorder) 08/30/2017   Migraine without aura 08/10/2015   Spinal stenosis, lumbar 01/14/2015   Carpal tunnel syndrome 12/12/2013   HYPERLIPIDEMIA 12/05/2007   HYPERTENSION 12/05/2007   BRADYCARDIA 12/05/2007   DEGENERATIVE JOINT DISEASE 12/05/2007   Past Medical History:  Diagnosis Date   Anxiety    Cancer [facility])    ovarian cancer   Carpal tunnel syndrome of right wrist 11/2013   Depression    Ganglion cyst of wrist 11/2013   right    Hypertension    is on 3 meds. - states BP is under control; has been on med. x 12 yr.   Migraines    Right tennis elbow 11/2013    Family History  Problem Relation Age of Onset   Hypertension Mother    Diabetes Mother    Cancer Mother    Diabetes Brother    Hypertension Brother    Diabetes Brother    Hypertension Maternal Grandmother    Heart attack Paternal Grandmother    Cancer Other     Past Surgical History:  Procedure Laterality Date   ABDOMINAL HYSTERECTOMY     complete   CARPAL TUNNEL RELEASE Right 12/12/2013   Procedure: ENDOSCOPY RIGHT WRIST CARPAL TUNNEL RELEASE EXCISION GANGLION WRIST PRIMARY ;  Surgeon: Renette Butters, MD;  Location: [place];  Service: Orthopedics;  Laterality: Right;   CESAREAN SECTION  03/11/2005; 04/05/2008   CHOLECYSTECTOMY  02/11/2004   lap. chole.   LATERAL EPICONDYLE RELEASE Right 12/12/2013   Procedure: RIGHT TENNIS ELBOW RELEASE WITH BONE REPAIR;  Surgeon: Renette Butters, MD;  Location: [place];  Service: Orthopedics;  Laterality: Right;   LUMBAR LAMINECTOMY WITH COFLEX 1 LEVEL Bilateral 01/14/2015   Procedure: Laminectomy - bilateral - L4-L5 ;  Surgeon: Karie Chimera, MD;  Location: [place] NEURO ORS;  Service: Neurosurgery;  Laterality: Bilateral;  Laminectomy - bilateral - L4-L5    LUMBAR WOUND DEBRIDEMENT N/A 02/02/2015   Procedure: LUMBAR WOUND DEBRIDEMENT, WASHOUT, CLOSURE;  Surgeon: Consuella Lose, MD;  Location: [place] NEURO ORS;  Service: Neurosurgery;  Laterality: N/A;  lumbar wound debridement   TUBAL LIGATION  04/05/2008   Social History   Occupational History   Not on file  Tobacco Use   Smoking status: Never   Smokeless tobacco: Never  Vaping Use   Vaping Use: Never used  Substance and Sexual Activity   Alcohol use: No   Drug use: No   Sexual activity: Not on file

## 2022-05-25 ENCOUNTER — Other Ambulatory Visit: Payer: Self-pay

## 2022-05-25 ENCOUNTER — Encounter (HOSPITAL_BASED_OUTPATIENT_CLINIC_OR_DEPARTMENT_OTHER): Payer: Self-pay | Admitting: Orthopaedic Surgery

## 2022-05-25 NOTE — Progress Notes (Signed)
   05/25/22 1155  PAT Phone Screen  Do You Have Diabetes? No  Do You Have Hypertension? Yes  Have You Ever Been to the ER for Asthma? No  Have You Taken Oral Steroids in the Past 3 Months? No  Do you Take Phenteramine or any Other Diet Drugs? No  Recent  Lab Work, EKG, CXR? (S)  Yes  Where was this test performed? (S)  EKG 05/06/22 NSR BMET 05/03/22 + TB Quant 05/03/22---> CXR 05/09/22 WNL  Do you have a history of heart problems? No  Have You Ever Had Tests on Your Heart? No  Any Recent Hospitalizations? No  Height 5' (1.524 m)  Weight 108 kg  Pat Appointment Scheduled No  Reason for No Appointment Not Needed

## 2022-05-25 NOTE — Progress Notes (Signed)
Reviewed + TB serum test,  and f/u negative CXR with Dr Ambrose Pancoast. [place] for surgery from anesthesia standpoint. Also discussed with Education officer, environmental and IP.  Recommend screening for symptom development on the day of surgery. Pre op call done with patient. Denies any cough, fever or night sweats etc. Plan to proceed as scheduled on 05/31/22.

## 2022-05-26 ENCOUNTER — Other Ambulatory Visit: Payer: Self-pay

## 2022-05-26 DIAGNOSIS — G5602 Carpal tunnel syndrome, left upper limb: Secondary | ICD-10-CM

## 2022-05-30 ENCOUNTER — Encounter: Payer: Self-pay | Admitting: Orthopaedic Surgery

## 2022-05-30 NOTE — Progress Notes (Signed)

## 2022-05-31 ENCOUNTER — Ambulatory Visit (HOSPITAL_BASED_OUTPATIENT_CLINIC_OR_DEPARTMENT_OTHER): Payer: Medicaid Other | Admitting: Anesthesiology

## 2022-05-31 ENCOUNTER — Ambulatory Visit (HOSPITAL_BASED_OUTPATIENT_CLINIC_OR_DEPARTMENT_OTHER)
Admission: RE | Admit: 2022-05-31 | Discharge: 2022-05-31 | Disposition: A | Discharge status: Home / Self Care | Payer: Medicaid Other | Attending: Orthopaedic Surgery | Admitting: Orthopaedic Surgery

## 2022-05-31 ENCOUNTER — Encounter (HOSPITAL_BASED_OUTPATIENT_CLINIC_OR_DEPARTMENT_OTHER): Payer: Self-pay | Admitting: Orthopaedic Surgery

## 2022-05-31 ENCOUNTER — Encounter (HOSPITAL_BASED_OUTPATIENT_CLINIC_OR_DEPARTMENT_OTHER)
Admission: RE | Disposition: A | Discharge status: Home / Self Care | Payer: Self-pay | Source: Home / Self Care | Attending: Orthopaedic Surgery

## 2022-05-31 DIAGNOSIS — M1990 Unspecified osteoarthritis, unspecified site: Secondary | ICD-10-CM | POA: Diagnosis not present

## 2022-05-31 DIAGNOSIS — G5602 Carpal tunnel syndrome, left upper limb: Secondary | ICD-10-CM

## 2022-05-31 DIAGNOSIS — I10 Essential (primary) hypertension: Secondary | ICD-10-CM | POA: Insufficient documentation

## 2022-05-31 DIAGNOSIS — G43909 Migraine, unspecified, not intractable, without status migrainosus: Secondary | ICD-10-CM | POA: Diagnosis not present

## 2022-05-31 DIAGNOSIS — K219 Gastro-esophageal reflux disease without esophagitis: Secondary | ICD-10-CM | POA: Diagnosis not present

## 2022-05-31 DIAGNOSIS — F418 Other specified anxiety disorders: Secondary | ICD-10-CM

## 2022-05-31 DIAGNOSIS — Z6841 Body Mass Index (BMI) 40.0 and over, adult: Secondary | ICD-10-CM | POA: Diagnosis not present

## 2022-05-31 DIAGNOSIS — Z8249 Family history of ischemic heart disease and other diseases of the circulatory system: Secondary | ICD-10-CM | POA: Diagnosis not present

## 2022-05-31 HISTORY — PX: CARPAL TUNNEL RELEASE: SHX101

## 2022-05-31 SURGERY — CARPAL TUNNEL RELEASE
Anesthesia: Monitor Anesthesia Care | Site: Wrist | Laterality: Left

## 2022-05-31 MED ORDER — MIDAZOLAM HCL 2 MG/2ML IJ SOLN
INTRAMUSCULAR | Status: AC
  Filled 2022-05-31: qty 2

## 2022-05-31 MED ORDER — FENTANYL CITRATE (PF) 100 MCG/2ML IJ SOLN
25.0000 ug | INTRAMUSCULAR | Status: DC | PRN
  Administered 2022-05-31 (×3): 50 ug via INTRAVENOUS

## 2022-05-31 MED ORDER — FENTANYL CITRATE (PF) 100 MCG/2ML IJ SOLN
INTRAMUSCULAR | Status: DC | PRN
  Administered 2022-05-31: 50 ug via INTRAVENOUS

## 2022-05-31 MED ORDER — LIDOCAINE-EPINEPHRINE (PF) 1 %-1:200000 IJ SOLN
INTRAMUSCULAR | Status: DC | PRN
  Administered 2022-05-31: 5 mL

## 2022-05-31 MED ORDER — CEFAZOLIN SODIUM-DEXTROSE 2-4 GM/100ML-% IV SOLN
2.0000 g | INTRAVENOUS | Status: AC
  Administered 2022-05-31: 2 g via INTRAVENOUS

## 2022-05-31 MED ORDER — LIDOCAINE-EPINEPHRINE (PF) 1 %-1:200000 IJ SOLN
INTRAMUSCULAR | Status: AC
  Filled 2022-05-31: qty 30

## 2022-05-31 MED ORDER — LACTATED RINGERS IV SOLN: INTRAVENOUS | Status: DC

## 2022-05-31 MED ORDER — MIDAZOLAM HCL 5 MG/5ML IJ SOLN
INTRAMUSCULAR | Status: DC | PRN
  Administered 2022-05-31: 2 mg via INTRAVENOUS

## 2022-05-31 MED ORDER — CEFAZOLIN SODIUM-DEXTROSE 2-4 GM/100ML-% IV SOLN
INTRAVENOUS | Status: AC
  Filled 2022-05-31: qty 100

## 2022-05-31 MED ORDER — FENTANYL CITRATE (PF) 100 MCG/2ML IJ SOLN
INTRAMUSCULAR | Status: AC
  Filled 2022-05-31: qty 2

## 2022-05-31 MED ORDER — ACETAMINOPHEN 500 MG PO TABS
1000.0000 mg | ORAL_TABLET | Freq: Once | ORAL | Status: AC
  Administered 2022-05-31: 1000 mg via ORAL

## 2022-05-31 MED ORDER — PROMETHAZINE HCL 25 MG/ML IJ SOLN: 6.2500 mg | INTRAMUSCULAR | Status: DC | PRN

## 2022-05-31 MED ORDER — ACETAMINOPHEN 500 MG PO TABS
ORAL_TABLET | ORAL | Status: AC
  Filled 2022-05-31: qty 2

## 2022-05-31 MED ORDER — BUPIVACAINE HCL (PF) 0.25 % IJ SOLN
INTRAMUSCULAR | Status: DC | PRN
  Administered 2022-05-31: 5 mL

## 2022-05-31 MED ORDER — PROPOFOL 500 MG/50ML IV EMUL
INTRAVENOUS | Status: DC | PRN
  Administered 2022-05-31: 100 ug/kg/min via INTRAVENOUS

## 2022-05-31 MED ORDER — PROPOFOL 10 MG/ML IV BOLUS
INTRAVENOUS | Status: DC | PRN
  Administered 2022-05-31 (×4): 20 mg via INTRAVENOUS

## 2022-05-31 MED ORDER — HYDROCODONE-ACETAMINOPHEN 5-325 MG PO TABS: 1.0000 | ORAL_TABLET | Freq: Four times a day (QID) | ORAL | 0 refills | Status: DC | PRN

## 2022-05-31 MED ORDER — 0.9 % SODIUM CHLORIDE (POUR BTL) OPTIME
TOPICAL | Status: DC | PRN
  Administered 2022-05-31: 200 mL

## 2022-05-31 SURGICAL SUPPLY — 47 items
BAND INSRT 18 STRL LF DISP RB (MISCELLANEOUS) ×2
BAND RUBBER #18 3X1/16 STRL (MISCELLANEOUS) ×2 IMPLANT
BLADE MINI RND TIP GREEN BEAV (BLADE) ×1 IMPLANT
BLADE SURG 15 STRL LF DISP TIS (BLADE) ×1 IMPLANT
BLADE SURG 15 STRL SS (BLADE) ×1
BNDG CMPR 9X4 STRL LF SNTH (GAUZE/BANDAGES/DRESSINGS) ×1
BNDG ELASTIC 3X5.8 VLCR STR LF (GAUZE/BANDAGES/DRESSINGS) ×1 IMPLANT
BNDG ESMARK 4X9 LF (GAUZE/BANDAGES/DRESSINGS) ×1 IMPLANT
BNDG PLASTER X FAST 3X3 WHT LF (CAST SUPPLIES) IMPLANT
BNDG PLSTR 9X3 FST ST WHT (CAST SUPPLIES)
BRUSH SCRUB EZ PLAIN DRY (MISCELLANEOUS) ×1 IMPLANT
CANISTER SUCT 1200ML W/VALVE (MISCELLANEOUS) ×1 IMPLANT
CORD BIPOLAR FORCEPS 12FT (ELECTRODE) ×1 IMPLANT
COVER BACK TABLE 60X90IN (DRAPES) ×1 IMPLANT
COVER MAYO STAND STRL (DRAPES) ×1 IMPLANT
CUFF TOURN SGL QUICK 18X4 (TOURNIQUET CUFF) IMPLANT
DRAPE EXTREMITY T 121X128X90 (DISPOSABLE) ×1 IMPLANT
DRAPE IMP U-DRAPE 54X76 (DRAPES) ×1 IMPLANT
DRAPE SURG 17X23 STRL (DRAPES) ×1 IMPLANT
GAUZE 4X4 16PLY ~~LOC~~+RFID DBL (SPONGE) IMPLANT
GAUZE SPONGE 4X4 12PLY STRL (GAUZE/BANDAGES/DRESSINGS) ×1 IMPLANT
GAUZE XEROFORM 1X8 LF (GAUZE/BANDAGES/DRESSINGS) ×1 IMPLANT
GLOVE BIOGEL PI IND STRL 7.5 (GLOVE) ×1 IMPLANT
GLOVE ECLIPSE 7.0 STRL STRAW (GLOVE) ×1 IMPLANT
GLOVE INDICATOR 7.0 STRL GRN (GLOVE) ×1 IMPLANT
GLOVE SURG SYN 7.5  E (GLOVE) ×1
GLOVE SURG SYN 7.5 E (GLOVE) ×1 IMPLANT
GLOVE SURG SYN 7.5 PF PI (GLOVE) ×1 IMPLANT
GOWN STRL REUS W/ TWL LRG LVL3 (GOWN DISPOSABLE) ×1 IMPLANT
GOWN STRL REUS W/TWL LRG LVL3 (GOWN DISPOSABLE) ×1
GOWN STRL SURGICAL XL XLNG (GOWN DISPOSABLE) ×2 IMPLANT
NDL HYPO 25X1 1.5 SAFETY (NEEDLE) IMPLANT
NEEDLE HYPO 25X1 1.5 SAFETY (NEEDLE) IMPLANT
NS IRRIG 1000ML POUR BTL (IV SOLUTION) ×1 IMPLANT
PACK BASIN DAY SURGERY FS (CUSTOM PROCEDURE TRAY) ×1 IMPLANT
PAD CAST 3X4 CTTN HI CHSV (CAST SUPPLIES) ×1 IMPLANT
PADDING CAST COTTON 3X4 STRL (CAST SUPPLIES) ×1
SHEET MEDIUM DRAPE 40X70 STRL (DRAPES) ×1 IMPLANT
SPIKE FLUID TRANSFER (MISCELLANEOUS) IMPLANT
STOCKINETTE 4X48 STRL (DRAPES) ×1 IMPLANT
SUT ETHILON 3 0 PS 1 (SUTURE) ×1 IMPLANT
SYR BULB EAR ULCER 3OZ GRN STR (SYRINGE) ×1 IMPLANT
SYR CONTROL 10ML LL (SYRINGE) IMPLANT
TOWEL GREEN STERILE FF (TOWEL DISPOSABLE) ×1 IMPLANT
TRAY DSU PREP LF (CUSTOM PROCEDURE TRAY) ×1 IMPLANT
TUBE CONNECTING 20X1/4 (TUBING) IMPLANT
UNDERPAD 30X36 HEAVY ABSORB (UNDERPADS AND DIAPERS) ×1 IMPLANT

## 2022-05-31 NOTE — H&P (Signed)
PREOPERATIVE H&P  Chief Complaint: left carpal tunnel syndrome  HPI: [name] is a 46 y.o. female who presents for surgical treatment of left carpal tunnel syndrome.  She denies any changes in medical history.  Past Medical History:  Diagnosis Date   Anxiety    Cancer [facility])    ovarian cancer   Carpal tunnel syndrome of right wrist 11/2013   Depression    Ganglion cyst of wrist 11/2013   right   Hypertension    is on 3 meds. - states BP is under control; has been on med. x 12 yr.   Migraines    Right tennis elbow 11/2013   Past Surgical History:  Procedure Laterality Date   ABDOMINAL HYSTERECTOMY     complete   CARPAL TUNNEL RELEASE Right 12/12/2013   Procedure: ENDOSCOPY RIGHT WRIST CARPAL TUNNEL RELEASE EXCISION GANGLION WRIST PRIMARY ;  Surgeon: Renette Butters, MD;  Location: [place];  Service: Orthopedics;  Laterality: Right;   CESAREAN SECTION  03/11/2005; 04/05/2008   CHOLECYSTECTOMY  02/11/2004   lap. chole.   LATERAL EPICONDYLE RELEASE Right 12/12/2013   Procedure: RIGHT TENNIS ELBOW RELEASE WITH BONE REPAIR;  Surgeon: Renette Butters, MD;  Location: [place];  Service: Orthopedics;  Laterality: Right;   LUMBAR LAMINECTOMY WITH COFLEX 1 LEVEL Bilateral 01/14/2015   Procedure: Laminectomy - bilateral - L4-L5 ;  Surgeon: Karie Chimera, MD;  Location: [place] [facility];  Service: Neurosurgery;  Laterality: Bilateral;  Laminectomy - bilateral - L4-L5    LUMBAR WOUND DEBRIDEMENT N/A 02/02/2015   Procedure: LUMBAR WOUND DEBRIDEMENT, WASHOUT, CLOSURE;  Surgeon: Consuella Lose, MD;  Location: [place] [facility];  Service: Neurosurgery;  Laterality: N/A;  lumbar wound debridement   TUBAL LIGATION  04/05/2008   Social History   Socioeconomic History   Marital status: Married    Spouse name: Not on file   Number of children: Not on file   Years of education: Not on file   Highest education level: Not on file  Occupational History   Not on  file  Tobacco Use   Smoking status: Never   Smokeless tobacco: Never  Vaping Use   Vaping Use: Never used  Substance and Sexual Activity   Alcohol use: No   Drug use: No   Sexual activity: Not on file  Other Topics Concern   Not on file  Social History Narrative   Not on file   Social Determinants of Health   Financial Resource Strain: Not on file  Food Insecurity: Not on file  Transportation Needs: Not on file  Physical Activity: Not on file  Stress: Not on file  Social Connections: Not on file   Family History  Problem Relation Age of Onset   Hypertension Mother    Diabetes Mother    Cancer Mother    Diabetes Brother    Hypertension Brother    Diabetes Brother    Hypertension Maternal Grandmother    Heart attack Paternal Grandmother    Cancer Other    Allergies  Allergen Reactions   Venlafaxine Anaphylaxis and Nausea Only    Insomnia; Depression Insomnia; Depression    Prior to Admission medications   Medication Sig Start Date End Date Taking? Authorizing Provider  amLODipine (NORVASC) 10 MG tablet Take 1 tablet (10 mg total) by mouth daily. 02/23/22  Yes Minette Brine, FNP  estradiol (ESTRACE) 2 MG tablet Take 2 mg by mouth daily. 03/11/21  Yes [provider]  hydrochlorothiazide (HYDRODIURIL) 25 MG tablet Take 1 tablet (25 mg total) by mouth every morning. 04/10/22  Yes Minette Brine, FNP  ibuprofen (ADVIL) 200 MG tablet Take by mouth.   Yes [provider]  losartan (COZAAR) 100 MG tablet Take 1 tablet (100 mg total) by mouth daily. 09/27/21  Yes Minette Brine, FNP  pantoprazole (PROTONIX) 40 MG tablet Take 1 tablet (40 mg total) by mouth daily. 11/28/21 11/28/22 Yes Minette Brine, FNP  aspirin-acetaminophen-caffeine (EXCEDRIN MIGRAINE) [phone] MG tablet Take 2 tablets by mouth daily as needed for migraine.    [provider]  diclofenac (VOLTAREN) 75 MG EC tablet Take 1 tablet (75 mg total) by mouth 2 (two) times daily as needed. 05/24/22    Aundra Dubin, PA-C  methocarbamol (ROBAXIN-750) 750 MG tablet Take 1 tablet (750 mg total) by mouth 2 (two) times daily as needed for muscle spasms. 05/24/22   Aundra Dubin, PA-C  rizatriptan (MAXALT) 10 MG tablet TK 1 T PO  AT ONSET  AND  MAY REPEAT IN 2 HOURS IF NEEDED 11/06/17   [provider]     Positive ROS: All other systems have been reviewed and were otherwise negative with the exception of those mentioned in the HPI and as above.  Physical Exam: General: Alert, no acute distress Cardiovascular: No pedal edema Respiratory: No cyanosis, no use of accessory musculature GI: abdomen soft Skin: No lesions in the area of chief complaint Neurologic: Sensation intact distally Psychiatric: Patient is competent for consent with normal mood and affect Lymphatic: no lymphedema  MUSCULOSKELETAL: exam stable  Assessment: left carpal tunnel syndrome  Plan: Plan for Procedure(s): LEFT CARPAL TUNNEL RELEASE  The risks benefits and alternatives were discussed with the patient including but not limited to the risks of nonoperative treatment, versus surgical intervention including infection, bleeding, nerve injury,  blood clots, cardiopulmonary complications, morbidity, mortality, among others, and they were willing to proceed.   Eduard Roux, MD 05/31/2022 1:03 PM

## 2022-05-31 NOTE — Discharge Instructions (Addendum)
Postoperative instructions:  Weightbearing instructions: no lifting more than 10 lbs  Dressing instructions: Keep your dressing and/or splint clean and dry at all times.  It will be removed at your first post-operative appointment.  Your stitches and/or staples will be removed at this visit.  Incision instructions:  Do not soak your incision for 3 weeks after surgery.  If the incision gets wet, pat dry and do not scrub the incision.  Pain control:  You have been given a prescription to be taken as directed for post-operative pain control.  In addition, elevate the operative extremity above the heart at all times to prevent swelling and throbbing pain. LAST DOSE of TYLENOL given at 1:20  Take over-the-counter Colace, '100mg'$  by mouth twice a day while taking narcotic pain medications to help prevent constipation.  Follow up appointments: 1) 7 days for suture removal and wound check. 2) Dr. Erlinda Hong as scheduled.   -------------------------------------------------------------------------------------------------------------  After Surgery Pain Control:  After your surgery, post-surgical discomfort or pain is likely. This discomfort can last several days to a few weeks. At certain times of the day your discomfort may be more intense.  Did you receive a nerve block?  A nerve block can provide pain relief for one hour to two days after your surgery. As long as the nerve block is working, you will experience little or no sensation in the area the surgeon operated on.  As the nerve block wears off, you will begin to experience pain or discomfort. It is very important that you begin taking your prescribed pain medication before the nerve block fully wears off. Treating your pain at the first sign of the block wearing off will ensure your pain is better controlled and more tolerable when full-sensation returns. Do not wait until the pain is intolerable, as the medicine will be less effective. It is better  to treat pain in advance than to try and catch up.  General Anesthesia:  If you did not receive a nerve block during your surgery, you will need to start taking your pain medication shortly after your surgery and should continue to do so as prescribed by your surgeon.  Pain Medication:  Most commonly we prescribe Vicodin and Percocet for post-operative pain. Both of these medications contain a combination of acetaminophen (Tylenol) and a narcotic to help control pain.   It takes between 30 and 45 minutes before pain medication starts to work. It is important to take your medication before your pain level gets too intense.   Nausea is a common side effect of many pain medications. You will want to eat something before taking your pain medicine to help prevent nausea.   If you are taking a prescription pain medication that contains acetaminophen, we recommend that you do not take additional over the counter acetaminophen (Tylenol).  Other pain relieving options:   Using a cold pack to ice the affected area a few times a day (15 to 20 minutes at a time) can help to relieve pain, reduce swelling and bruising.   Elevation of the affected area can also help to reduce pain and swelling.   Post Anesthesia Home Care Instructions  Activity: Get plenty of rest for the remainder of the day. A responsible individual must stay with you for 24 hours following the procedure.  For the next 24 hours, DO NOT: -Drive a car -Paediatric nurse -Drink alcoholic beverages -Take any medication unless instructed by your physician -Make any legal decisions or sign important papers.  Meals: Start with liquid foods such as gelatin or soup. Progress to regular foods as tolerated. Avoid greasy, spicy, heavy foods. If nausea and/or vomiting occur, drink only clear liquids until the nausea and/or vomiting subsides. Call your physician if vomiting continues.  Special Instructions/Symptoms: Your throat may feel dry  or sore from the anesthesia or the breathing tube placed in your throat during surgery. If this causes discomfort, gargle with warm salt water. The discomfort should disappear within 24 hours.  If you had a scopolamine patch placed behind your ear for the management of post- operative nausea and/or vomiting:  1. The medication in the patch is effective for 72 hours, after which it should be removed.  Wrap patch in a tissue and discard in the trash. Wash hands thoroughly with soap and water. 2. You may remove the patch earlier than 72 hours if you experience unpleasant side effects which may include dry mouth, dizziness or visual disturbances. 3. Avoid touching the patch. Wash your hands with soap and water after contact with the patch.

## 2022-05-31 NOTE — Op Note (Signed)
   Carpal tunnel op note  DATE OF SURGERY:05/31/2022  PREOPERATIVE DIAGNOSIS:  Left carpal tunnel syndrome  POSTOPERATIVE DIAGNOSIS: same  PROCEDURE: Left carpal tunnel release. CPT 09381  SURGEON: Marianna Payment, M.D.  ASSIST: Madalyn Rob, [place]  ANESTHESIA:  Local and MAC  TOURNIQUET TIME: less than 20 minutes  BLOOD LOSS: Minimal.  COMPLICATIONS: None.  PATHOLOGY: None.  INDICATIONS: The patient is a 46 y.o. -year-old female who presented with carpal tunnel syndrome failing nonsurgical management, indicated for surgical release.  DESCRIPTION OF PROCEDURE: The patient was identified in the preoperative holding area.  The operative site was marked by the surgeon and confirmed by the patient.  The patient was brought back to the operating room.  MAC anesthesia was administered.  Local anesthetic with epi was injected into the operative site.  A well padded nonsterile tourniquet was placed. The operative extremity was prepped and draped in standard sterile fashion.  A timeout was performed.  Preoperative antibiotics were given.   A palmar incision was made about 5 mm ulnar to the thenar crease.  The palmar aponeurosis was exposed and divided in line with the skin incision. The palmaris brevis was visualized and divided.  The distal edge of the transcarpal ligament was identified. A hemostat was inserted into the carpal tunnel to protect the median nerve and the flexor tendons. Then, the transverse carpal ligament was released under direct visualization. Proximally, a subcutaneous tunnel was made allowing a [place]ell retractor to be placed. Then, the distal portion of the antebrachial fascia was released. Distally, all fibrous bands were released. The median nerve was visualized, and the fat pad was exposed. Wound was irrigated and closed with 4-0 nylon sutures. Sterile dressing applied. The patient was transferred to the recovery room in stable condition after all counts were  correct.  POSTOPERATIVE PLAN: To start nerve gliding exercises as tolerated and no heavy lifting for four weeks.  Eduard Roux, M.D. [facility] 3:07 PM

## 2022-05-31 NOTE — Anesthesia Procedure Notes (Signed)
Procedure Name: MAC Date/Time: 05/31/2022 2:43 PM  Performed by: Glory Buff, CRNAOxygen Delivery Method: Simple face mask

## 2022-05-31 NOTE — Transfer of Care (Signed)
Immediate Anesthesia Transfer of Care Note  Patient: [name]  Procedure(s) Performed: LEFT CARPAL TUNNEL RELEASE (Left: Wrist)  Patient Location: PACU  Anesthesia Type:MAC  Level of Consciousness: drowsy and patient cooperative  Airway & Oxygen Therapy: Patient Spontanous Breathing and Patient connected to face mask oxygen  Post-op Assessment: Report given to RN and Post -op Vital signs reviewed and stable  Post vital signs: Reviewed and stable  Last Vitals:  Vitals Value Taken Time  BP 131/87 05/31/22 1516  Temp    Pulse 73 05/31/22 1517  Resp 32 05/31/22 1517  SpO2 96 % 05/31/22 1517  Vitals shown include unvalidated device data.  Last Pain:  Vitals:   05/31/22 1318  TempSrc: Oral  PainSc: 7          Complications: No notable events documented.

## 2022-05-31 NOTE — Anesthesia Preprocedure Evaluation (Addendum)
Anesthesia Evaluation  Patient identified by MRN, date of birth, ID band Patient awake    Reviewed: Allergy & Precautions, NPO status , Patient's Chart, lab work & pertinent test results  Airway Mallampati: III  TM Distance: >3 FB Neck ROM: Full    Dental  (+) Teeth Intact, Dental Advisory Given   Pulmonary neg pulmonary ROS   Pulmonary exam normal breath sounds clear to auscultation       Cardiovascular hypertension, Pt. on medications Normal cardiovascular exam Rhythm:Regular Rate:Normal     Neuro/Psych  Headaches PSYCHIATRIC DISORDERS Anxiety Depression    left carpal tunnel syndrome    GI/Hepatic Neg liver ROS,GERD  Medicated and Controlled,,  Endo/Other    Morbid obesity  Renal/GU negative Renal ROS     Musculoskeletal  (+) Arthritis ,    Abdominal   Peds  Hematology negative hematology ROS (+)   Anesthesia Other Findings Day of surgery medications reviewed with the patient.  Reproductive/Obstetrics negative OB ROS                             Anesthesia Physical Anesthesia Plan  ASA: 3  Anesthesia Plan: MAC   Post-op Pain Management: Tylenol PO (pre-op)*   Induction: Intravenous  PONV Risk Score and Plan: 2 and TIVA, Treatment may vary due to age or medical condition, Dexamethasone and Ondansetron  Airway Management Planned: Nasal Cannula and Natural Airway  Additional Equipment:   Intra-op Plan:   Post-operative Plan:   Informed Consent: I have reviewed the patients History and Physical, chart, labs and discussed the procedure including the risks, benefits and alternatives for the proposed anesthesia with the patient or authorized representative who has indicated his/her understanding and acceptance.     Dental advisory given  Plan Discussed with: CRNA and Anesthesiologist  Anesthesia Plan Comments:        Anesthesia Quick Evaluation

## 2022-05-31 NOTE — Anesthesia Postprocedure Evaluation (Signed)
Anesthesia Post Note  Patient: [name]  Procedure(s) Performed: LEFT CARPAL TUNNEL RELEASE (Left: Wrist)     Patient location during evaluation: PACU Anesthesia Type: MAC Level of consciousness: awake and alert Pain management: pain level controlled Vital Signs Assessment: post-procedure vital signs reviewed and stable Respiratory status: spontaneous breathing, nonlabored ventilation, respiratory function stable and patient connected to nasal cannula oxygen Cardiovascular status: stable and blood pressure returned to baseline Postop Assessment: no apparent nausea or vomiting Anesthetic complications: no   No notable events documented.  Last Vitals:  Vitals:   05/31/22 1600 05/31/22 1619  BP: 125/81 (!) 161/89  Pulse: 72 60  Resp: 18   Temp: 36.7 C   SpO2: 96% 95%    Last Pain:  Vitals:   05/31/22 1613  TempSrc:   PainSc: [place]

## 2022-06-01 ENCOUNTER — Telehealth: Payer: Self-pay | Admitting: *Deleted

## 2022-06-01 ENCOUNTER — Encounter (HOSPITAL_BASED_OUTPATIENT_CLINIC_OR_DEPARTMENT_OTHER): Payer: Self-pay | Admitting: Orthopaedic Surgery

## 2022-06-01 NOTE — Telephone Encounter (Signed)
Pt daugher called wanting to know if pt can get a stronger medication says she is taking motrin and it isnt helping her at all. Please advise.

## 2022-06-02 ENCOUNTER — Other Ambulatory Visit: Payer: Self-pay | Admitting: Physician Assistant

## 2022-06-02 ENCOUNTER — Telehealth: Payer: Self-pay

## 2022-06-02 MED ORDER — OXYCODONE-ACETAMINOPHEN 5-325 MG PO TABS: 1.0000 | ORAL_TABLET | Freq: Three times a day (TID) | ORAL | 0 refills | Status: DC | PRN

## 2022-06-02 NOTE — Telephone Encounter (Signed)
Gotcha.  Sent in [place]

## 2022-06-02 NOTE — Telephone Encounter (Signed)
Called and notified daughter.

## 2022-06-02 NOTE — Telephone Encounter (Signed)
Left patient voicemail to give the office a call back after surgery. Provider wanted to give the patient a call to see how she is doing.

## 2022-06-02 NOTE — Telephone Encounter (Signed)
It looks like xu sent in norco two days ago

## 2022-06-05 ENCOUNTER — Ambulatory Visit: Payer: Medicaid Other | Admitting: Rehabilitative and Restorative Service Providers"

## 2022-06-06 NOTE — Telephone Encounter (Signed)
Pt has a cardiologist appt that was scheduled at the same time and date. Please call asap to schedule suture removal..[phone]

## 2022-06-07 ENCOUNTER — Encounter: Payer: Medicaid Other | Admitting: Orthopaedic Surgery

## 2022-06-07 NOTE — Telephone Encounter (Signed)
Called and scheduled patient

## 2022-06-08 ENCOUNTER — Ambulatory Visit (INDEPENDENT_AMBULATORY_CARE_PROVIDER_SITE_OTHER): Payer: Medicaid Other | Admitting: Physician Assistant

## 2022-06-08 DIAGNOSIS — G5602 Carpal tunnel syndrome, left upper limb: Secondary | ICD-10-CM

## 2022-06-08 DIAGNOSIS — R931 Abnormal findings on diagnostic imaging of heart and coronary circulation: Secondary | ICD-10-CM | POA: Insufficient documentation

## 2022-06-08 DIAGNOSIS — Z9889 Other specified postprocedural states: Secondary | ICD-10-CM

## 2022-06-08 NOTE — Progress Notes (Signed)
Post-Op Visit Note   Patient: [name]           Date of Birth: 06-21-1976           MRN: [id number] Visit Date: 06/08/2022 PCP: Minette Brine, FNP   Assessment & Plan:  Chief Complaint:  Chief Complaint  Patient presents with   Left Wrist - Pain   Visit Diagnoses:  1. Carpal tunnel syndrome on left   2. S/P carpal tunnel release     Plan: Patient is a pleasant 46 year old female who comes in today 1 week status post left carpal tunnel release 05/31/2022.  She has been doing well.  Minimal discomfort.  No paresthesias.  Examination of her left hand reveals a well-healing surgical incision with nylon sutures in place.  No evidence of infection or cellulitis.  Fingers warm well perfused.  She is neurovascular tact distally.  Today, her wound was cleaned and recovered.  Removable splint provided.  No heavy lifting or submerging her hand underwater for 3 more weeks.  She may begin gentle range of motion exercises of the finger and wrist.  Follow-up next week for suture removal.  Call with concerns or questions.  Follow-Up Instructions: Return in about 1 week (around 06/15/2022).   Orders:  No orders of the defined types were placed in this encounter.  No orders of the defined types were placed in this encounter.   Imaging: No new imaging  PMFS History: Patient Active Problem List   Diagnosis Date Noted   Current mild episode of major depressive disorder, unspecified whether recurrent ([place]) 01/30/2022   Carpal tunnel syndrome on left 11/28/2021   Numbness and tingling in left hand 10/11/2021   Abdominal cramping 09/26/2021   GAD (generalized anxiety disorder) 08/30/2017   Migraine without aura 08/10/2015   Spinal stenosis, lumbar 01/14/2015   Carpal tunnel syndrome 12/12/2013   HYPERLIPIDEMIA 12/05/2007   HYPERTENSION 12/05/2007   BRADYCARDIA 12/05/2007   DEGENERATIVE JOINT DISEASE 12/05/2007   Past Medical History:  Diagnosis Date   Anxiety    Cancer ([place])     ovarian cancer   Carpal tunnel syndrome of right wrist 11/2013   Depression    Ganglion cyst of wrist 11/2013   right   Hypertension    is on 3 meds. - states BP is under control; has been on med. x 12 yr.   Migraines    Right tennis elbow 11/2013    Family History  Problem Relation Age of Onset   Hypertension Mother    Diabetes Mother    Cancer Mother    Diabetes Brother    Hypertension Brother    Diabetes Brother    Hypertension Maternal Grandmother    Heart attack Paternal Grandmother    Cancer Other     Past Surgical History:  Procedure Laterality Date   ABDOMINAL HYSTERECTOMY     complete   CARPAL TUNNEL RELEASE Right 12/12/2013   Procedure: ENDOSCOPY RIGHT WRIST CARPAL TUNNEL RELEASE EXCISION GANGLION WRIST PRIMARY ;  Surgeon: Renette Butters, MD;  Location: [place];  Service: Orthopedics;  Laterality: Right;   CARPAL TUNNEL RELEASE Left 05/31/2022   Procedure: LEFT CARPAL TUNNEL RELEASE;  Surgeon: Leandrew Koyanagi, MD;  Location: [place];  Service: Orthopedics;  Laterality: Left;   CESAREAN SECTION  03/11/2005; 04/05/2008   CHOLECYSTECTOMY  02/11/2004   lap. chole.   LATERAL EPICONDYLE RELEASE Right 12/12/2013   Procedure: RIGHT TENNIS ELBOW RELEASE WITH BONE REPAIR;  Surgeon:  Renette Butters, MD;  Location: [place];  Service: Orthopedics;  Laterality: Right;   LUMBAR LAMINECTOMY WITH COFLEX 1 LEVEL Bilateral 01/14/2015   Procedure: Laminectomy - bilateral - L4-L5 ;  Surgeon: Karie Chimera, MD;  Location: [place] [facility];  Service: Neurosurgery;  Laterality: Bilateral;  Laminectomy - bilateral - L4-L5    LUMBAR WOUND DEBRIDEMENT N/A 02/02/2015   Procedure: LUMBAR WOUND DEBRIDEMENT, WASHOUT, CLOSURE;  Surgeon: Consuella Lose, MD;  Location: [place] [facility];  Service: Neurosurgery;  Laterality: N/A;  lumbar wound debridement   TUBAL LIGATION  04/05/2008   Social History   Occupational History   Not on file  Tobacco Use    Smoking status: Never   Smokeless tobacco: Never  Vaping Use   Vaping Use: Never used  Substance and Sexual Activity   Alcohol use: No   Drug use: No   Sexual activity: Not on file

## 2022-06-13 ENCOUNTER — Telehealth: Payer: Self-pay | Admitting: Orthopaedic Surgery

## 2022-06-13 NOTE — Telephone Encounter (Signed)
Pt is in patient at [facility] had an emergency surgery due to cardiac blockage. Has an appt with dr. Erlinda Hong tomorrow and needs to have stitches removed. Please call daughter to advise what to do..[phone].

## 2022-06-13 NOTE — Telephone Encounter (Signed)
Called patient. No answer. LMOM for her to call us back for suture removal when she is available.

## 2022-06-13 NOTE — Telephone Encounter (Signed)
Tried to call patient. No answer. LMOM for her to call back to reschedule appointment for suture removal.

## 2022-06-13 NOTE — Telephone Encounter (Signed)
Pt had an appt for tomorrow with dr. Erlinda Hong, she has been admitted to [facility] for emergency Cardiac Cath. She will have to stay over night, family advised to let them know about the suture removal. Pleas call..[phone]

## 2022-06-14 ENCOUNTER — Encounter: Payer: Medicaid Other | Admitting: Orthopaedic Surgery

## 2022-06-19 ENCOUNTER — Ambulatory Visit: Payer: Medicaid Other | Admitting: Rehabilitative and Restorative Service Providers"

## 2022-06-19 NOTE — Therapy (Incomplete)
OUTPATIENT OCCUPATIONAL THERAPY ORTHO EVALUATION  Patient Name: [name] MRN: [id number] DOB:June 20, 1976, 46 y.o., female Today's Date: 06/19/2022  PCP: Minette Brine, FNP REFERRING PROVIDER:  Leandrew Koyanagi, MD    END OF SESSION:   Past Medical History:  Diagnosis Date   Anxiety    Cancer [facility])    ovarian cancer   Carpal tunnel syndrome of right wrist 11/2013   Depression    Ganglion cyst of wrist 11/2013   right   Hypertension    is on 3 meds. - states BP is under control; has been on med. x 12 yr.   Migraines    Right tennis elbow 11/2013   Past Surgical History:  Procedure Laterality Date   ABDOMINAL HYSTERECTOMY     complete   CARPAL TUNNEL RELEASE Right 12/12/2013   Procedure: ENDOSCOPY RIGHT WRIST CARPAL TUNNEL RELEASE EXCISION GANGLION WRIST PRIMARY ;  Surgeon: Renette Butters, MD;  Location: [place];  Service: Orthopedics;  Laterality: Right;   CARPAL TUNNEL RELEASE Left 05/31/2022   Procedure: LEFT CARPAL TUNNEL RELEASE;  Surgeon: Leandrew Koyanagi, MD;  Location: [place];  Service: Orthopedics;  Laterality: Left;   CESAREAN SECTION  03/11/2005; 04/05/2008   CHOLECYSTECTOMY  02/11/2004   lap. chole.   LATERAL EPICONDYLE RELEASE Right 12/12/2013   Procedure: RIGHT TENNIS ELBOW RELEASE WITH BONE REPAIR;  Surgeon: Renette Butters, MD;  Location: [place];  Service: Orthopedics;  Laterality: Right;   LUMBAR LAMINECTOMY WITH COFLEX 1 LEVEL Bilateral 01/14/2015   Procedure: Laminectomy - bilateral - L4-L5 ;  Surgeon: Karie Chimera, MD;  Location: [place] [facility];  Service: Neurosurgery;  Laterality: Bilateral;  Laminectomy - bilateral - L4-L5    LUMBAR WOUND DEBRIDEMENT N/A 02/02/2015   Procedure: LUMBAR WOUND DEBRIDEMENT, WASHOUT, CLOSURE;  Surgeon: Consuella Lose, MD;  Location: [place] [facility];  Service: Neurosurgery;  Laterality: N/A;  lumbar wound debridement   TUBAL LIGATION  04/05/2008   Patient Active Problem List    Diagnosis Date Noted   Current mild episode of major depressive disorder, unspecified whether recurrent ([place]) 01/30/2022   Carpal tunnel syndrome on left 11/28/2021   Numbness and tingling in left hand 10/11/2021   Abdominal cramping 09/26/2021   GAD (generalized anxiety disorder) 08/30/2017   Migraine without aura 08/10/2015   Spinal stenosis, lumbar 01/14/2015   Carpal tunnel syndrome 12/12/2013   HYPERLIPIDEMIA 12/05/2007   HYPERTENSION 12/05/2007   BRADYCARDIA 12/05/2007   DEGENERATIVE JOINT DISEASE 12/05/2007    ONSET DATE: 05/31/22 DOS   REFERRING DIAG: G56.02 (ICD-10-CM) - Carpal tunnel syndrome of left wrist   THERAPY DIAG:  No diagnosis found.  Rationale for Evaluation and Treatment: Rehabilitation  SUBJECTIVE:   SUBJECTIVE STATEMENT: She states ***.   Pt accompanied by: {accompnied:27141}  PERTINENT HISTORY: Almost 3 weeks post op now Lt CTR.  She's previously had Rt CTR in 2015.    PRECAUTIONS: {Therapy precautions:24002}  WEIGHT BEARING RESTRICTIONS: {Yes ***/No:24003}  PAIN:  Are you having pain? *** in *** Rating: ***/10 at rest now, up to ***/10 at worst in past week  FALLS: Has patient fallen in last 6 months? {fallsyesno:27318}  LIVING ENVIRONMENT: Lives with: {OPRC lives with:25569::"lives with their family"} Lives in: {Lives in:25570} Stairs: {opstairs:27293} Has following equipment at home: {Assistive devices:23999}  PLOF: {PLOF:24004}  PATIENT GOALS: ***    OBJECTIVE: (All objective assessments below are from initial evaluation on: 06/19/22 unless otherwise specified.)    HAND DOMINANCE: Right ***  ADLs:  Overall ADLs: States decreased ability to grab, hold household objects, pain and inability to open containers, perform FMS tasks (manipulate fasteners on clothing), mild to moderate bathing problems as well. ***   FUNCTIONAL OUTCOME MEASURES: Eval: Quck DASH ***% impairment today  (Higher % Score  =  More Impairment)     Patient Specific Functional Scale: *** (***, ***, ***)  (Higher Score  =  Better Ability for the Selected Tasks)     Patient Rated Wrist Evaluation (PRWE): Pain: ***/50; Function: ***/50; Total Score: ***/100 (Higher Score  =  More Pain and/or Debility)    UPPER EXTREMITY ROM     Shoulder to Wrist AROM Right eval Left eval  Shoulder flexion    Shoulder abduction    Shoulder extension    Shoulder internal rotation    Shoulder external rotation    Elbow flexion    Elbow extension    Forearm supination    Forearm pronation     Wrist flexion    Wrist extension    Wrist ulnar deviation    Wrist radial deviation    Functional dart thrower's motion (F-DTM) in ulnar flexion    F-DTM in radial extension     (Blank rows = not tested)   Hand AROM Right eval Left eval  Full Fist Ability (or Gap to Distal Palmar Crease)    Thumb Opposition to Small Finger (or Gap)    Thumb Opposition to Base of Small Finger (or Gap)     Thumb MCP (0-60)    Thumb IP (0-80)    Thumb Radial abd/add (0-55)     Thumb Palmar abd/add (0-45)     Index MCP (0-90)     Index PIP (0-100)     Index DIP (0-70)      Long MCP (0-90)      Long PIP (0-100)      Long DIP (0-70)      Ring MCP (0-90)      Ring PIP (0-100)      Ring DIP (0-70)      Little MCP (0-90)      Little PIP (0-100)      Little DIP (0-70)      (Blank rows = not tested)   UPPER EXTREMITY MMT:    Eval: *** NT at eval due to recent and still healing injuries. Will be tested when appropriate.   MMT Right TBD Left TBD  Shoulder flexion    Shoulder abduction    Shoulder adduction    Shoulder extension    Shoulder internal rotation    Shoulder external rotation    Middle trapezius    Lower trapezius    Elbow flexion    Elbow extension    Forearm supination    Forearm pronation    Wrist flexion    Wrist extension    Wrist ulnar deviation    Wrist radial deviation    (Blank rows = not tested)  HAND FUNCTION: Eval:  Observed weakness in affected hand.  Grip strength Right: *** lbs, Left: *** lbs   COORDINATION: Eval: Observed coordination impairments with affected hand. Box and Blocks Test: *** Blocks today (*** is [facility]); 9 Hole Peg Test Right: ***sec, Left: *** sec (*** sec is WFL)   SENSATION: Eval: *** Light touch intact today, though diminished around sx area    EDEMA:   Eval: *** Mildly swollen in hand and wrist today, ***cm circumferentially around ***  COGNITION: Eval: Overall cognitive status: [facility] for evaluation  today ***  OBSERVATIONS:   Eval: ***   TODAY'S TREATMENT:  Post-evaluation treatment: ***    PATIENT EDUCATION: Education details: See tx section above for details  Person educated: Patient Education method: Veterinary surgeon, Teach back, Handouts  Education comprehension: States and demonstrates understanding, Additional Education required    HOME EXERCISE PROGRAM: See tx section above for details    GOALS: Goals reviewed with patient? Yes  SHORT TERM GOALS: Target date: ***  Pt will demo/state understanding of initial home exercise program (HEP) to improve function, pain, and independence.   Baseline: Needs a plan for rehabilitation  Goal status: INITIAL  2.  Pt will obtain protective, custom or mobilizing orthotic for safety and to improve function.  Baseline: Needs orthotic support  Goal status: {GOALSTATUS:25110}   LONG TERM GOALS: Target date: ***  Pt will improve functional ability by decreased impairment per Quick DASH / PSFS / PRWE assessment to *** or better, for better quality of life. Baseline: *** Goal status: INITIAL  2.  Pt will improve A/ROM in *** to at least ***, to have prerequisite/functional motion for tasks like reach and grasp.  Baseline: *** Goal status: INITIAL  3.  Pt will improve strength in *** to at least *** MMT to have increased functional ability to carry out selfcare and higher-level homecare tasks with no  difficulty Baseline: *** MMT Goal status: INITIAL  4.  Pt will improve coordination skills in ***, as seen by increasing score on *** testing to at least *** to have increased functional ability to carry out fine motor tasks (fasteners, etc.) and more complex, coordinated IADLs (meal prep, sports, etc.).  Baseline: *** Goal status: INITIAL  5.  Pt will decrease pain at rest to ***/10 or better to have better sleep/rest and occupational participation in daily roles. Baseline: ***/10 pain at rest Goal status: INITIAL  6.  Pt will improve grip strength in effected hand to at least ***lbs for functional use at home and in IADLs. Baseline: *** lbs Goal status: INITIAL   ASSESSMENT:  CLINICAL IMPRESSION: Patient is a 46 y.o. female who was seen today for occupational therapy evaluation for Lt hand pain, weakness s/p CTS and release and decreased fnl ability. She will benefit from OP OT to increase quality of life.   PERFORMANCE DEFICITS: in functional skills including {OT physical skills:25468}, cognitive skills including {OT cognitive skills:25469}, and psychosocial skills including {OT psychosocial skills:25470}.   IMPAIRMENTS: are limiting patient from {OT performance deficits:25471}.   COMORBIDITIES: has co-morbidities such as HLD, HTN, DJD, Rt CTR, lumbar pain s/p laminectomies, and more  that affects occupational performance. Patient will benefit from skilled OT to address above impairments and improve overall function.  MODIFICATION OR ASSISTANCE TO COMPLETE EVALUATION: {OT modification:25474}  OT OCCUPATIONAL PROFILE AND HISTORY: {OT PROFILE AND HISTORY:25484}  CLINICAL DECISION MAKING: {OT CDM:25475}  REHAB POTENTIAL: {rehabpotential:25112}  EVALUATION COMPLEXITY: {Evaluation complexity:25115}      PLAN:  OT FREQUENCY: {rehab frequency:25116}  OT DURATION: {rehab duration:25117}  PLANNED INTERVENTIONS: {OT Interventions:25467}  RECOMMENDED OTHER SERVICES:  ***  CONSULTED AND AGREED WITH PLAN OF CARE: {ZDG:38756}  PLAN FOR NEXT SESSION: ***   Benito Mccreedy, OTR/L, CHT 06/19/2022, 8:01 AM

## 2022-06-20 ENCOUNTER — Ambulatory Visit (INDEPENDENT_AMBULATORY_CARE_PROVIDER_SITE_OTHER): Payer: Medicaid Other | Admitting: Physician Assistant

## 2022-06-20 DIAGNOSIS — Z9889 Other specified postprocedural states: Secondary | ICD-10-CM

## 2022-06-20 NOTE — Progress Notes (Signed)
Post-Op Visit Note   Patient: BRIANY [name]           Date of Birth: 12-19-1975           MRN: [id number] Visit Date: 06/20/2022 PCP: Minette Brine, FNP   Assessment & Plan:  Chief Complaint: No chief complaint on file.  Visit Diagnoses:  1. S/P carpal tunnel release     Plan: Patient is a pleasant 46 year old female who comes in today 3 weeks status post left carpal tunnel release 05/31/2022.  She has been doing well.  Minimal incisional soreness.  No paresthesias.  Examination of her left wrist reveals a well-healed surgical incision with nylon sutures in place.  No evidence of infection or cellulitis.  Fingers warm well perfused.  She is neurovascular intact distally.  Today, sutures removed and Steri-Strips applied.  She will continue with range of motion exercises.  No heavy lifting or submerging her hand underwater for another week.  Follow-up in 3 weeks for recheck.  Call with concerns or questions.  Follow-Up Instructions: Return in about 3 weeks (around 07/11/2022).   Orders:  No orders of the defined types were placed in this encounter.  No orders of the defined types were placed in this encounter.   Imaging: No new imaging  PMFS History: Patient Active Problem List   Diagnosis Date Noted   Current mild episode of major depressive disorder, unspecified whether recurrent ([place]) 01/30/2022   Carpal tunnel syndrome on left 11/28/2021   Numbness and tingling in left hand 10/11/2021   Abdominal cramping 09/26/2021   GAD (generalized anxiety disorder) 08/30/2017   Migraine without aura 08/10/2015   Spinal stenosis, lumbar 01/14/2015   Carpal tunnel syndrome 12/12/2013   HYPERLIPIDEMIA 12/05/2007   HYPERTENSION 12/05/2007   BRADYCARDIA 12/05/2007   DEGENERATIVE JOINT DISEASE 12/05/2007   Past Medical History:  Diagnosis Date   Anxiety    Cancer ([place])    ovarian cancer   Carpal tunnel syndrome of right wrist 11/2013   Depression    Ganglion cyst of wrist  11/2013   right   Hypertension    is on 3 meds. - states BP is under control; has been on med. x 12 yr.   Migraines    Right tennis elbow 11/2013    Family History  Problem Relation Age of Onset   Hypertension Mother    Diabetes Mother    Cancer Mother    Diabetes Brother    Hypertension Brother    Diabetes Brother    Hypertension Maternal Grandmother    Heart attack Paternal Grandmother    Cancer Other     Past Surgical History:  Procedure Laterality Date   ABDOMINAL HYSTERECTOMY     complete   CARPAL TUNNEL RELEASE Right 12/12/2013   Procedure: ENDOSCOPY RIGHT WRIST CARPAL TUNNEL RELEASE EXCISION GANGLION WRIST PRIMARY ;  Surgeon: Renette Butters, MD;  Location: [place];  Service: Orthopedics;  Laterality: Right;   CARPAL TUNNEL RELEASE Left 05/31/2022   Procedure: LEFT CARPAL TUNNEL RELEASE;  Surgeon: Leandrew Koyanagi, MD;  Location: [place];  Service: Orthopedics;  Laterality: Left;   CESAREAN SECTION  03/11/2005; 04/05/2008   CHOLECYSTECTOMY  02/11/2004   lap. chole.   LATERAL EPICONDYLE RELEASE Right 12/12/2013   Procedure: RIGHT TENNIS ELBOW RELEASE WITH BONE REPAIR;  Surgeon: Renette Butters, MD;  Location: [place];  Service: Orthopedics;  Laterality: Right;   LUMBAR LAMINECTOMY WITH COFLEX 1 LEVEL Bilateral 01/14/2015  Procedure: Laminectomy - bilateral - L4-L5 ;  Surgeon: Karie Chimera, MD;  Location: [place] [facility];  Service: Neurosurgery;  Laterality: Bilateral;  Laminectomy - bilateral - L4-L5    LUMBAR WOUND DEBRIDEMENT N/A 02/02/2015   Procedure: LUMBAR WOUND DEBRIDEMENT, WASHOUT, CLOSURE;  Surgeon: Consuella Lose, MD;  Location: [place] [facility];  Service: Neurosurgery;  Laterality: N/A;  lumbar wound debridement   TUBAL LIGATION  04/05/2008   Social History   Occupational History   Not on file  Tobacco Use   Smoking status: Never   Smokeless tobacco: Never  Vaping Use   Vaping Use: Never used  Substance and Sexual  Activity   Alcohol use: No   Drug use: No   Sexual activity: Not on file

## 2022-06-23 NOTE — Therapy (Incomplete)
OUTPATIENT OCCUPATIONAL THERAPY ORTHO EVALUATION  Patient Name: [name] MRN: [id number] DOB:Jan 10, 1976, 46 y.o., female Today's Date: 06/23/2022  PCP: Minette Brine, FNP REFERRING PROVIDER:  Leandrew Koyanagi, MD    END OF SESSION:   Past Medical History:  Diagnosis Date   Anxiety    Cancer [facility])    ovarian cancer   Carpal tunnel syndrome of right wrist 11/2013   Depression    Ganglion cyst of wrist 11/2013   right   Hypertension    is on 3 meds. - states BP is under control; has been on med. x 12 yr.   Migraines    Right tennis elbow 11/2013   Past Surgical History:  Procedure Laterality Date   ABDOMINAL HYSTERECTOMY     complete   CARPAL TUNNEL RELEASE Right 12/12/2013   Procedure: ENDOSCOPY RIGHT WRIST CARPAL TUNNEL RELEASE EXCISION GANGLION WRIST PRIMARY ;  Surgeon: Renette Butters, MD;  Location: [place];  Service: Orthopedics;  Laterality: Right;   CARPAL TUNNEL RELEASE Left 05/31/2022   Procedure: LEFT CARPAL TUNNEL RELEASE;  Surgeon: Leandrew Koyanagi, MD;  Location: [place];  Service: Orthopedics;  Laterality: Left;   CESAREAN SECTION  03/11/2005; 04/05/2008   CHOLECYSTECTOMY  02/11/2004   lap. chole.   LATERAL EPICONDYLE RELEASE Right 12/12/2013   Procedure: RIGHT TENNIS ELBOW RELEASE WITH BONE REPAIR;  Surgeon: Renette Butters, MD;  Location: [place];  Service: Orthopedics;  Laterality: Right;   LUMBAR LAMINECTOMY WITH COFLEX 1 LEVEL Bilateral 01/14/2015   Procedure: Laminectomy - bilateral - L4-L5 ;  Surgeon: Karie Chimera, MD;  Location: [place] [facility];  Service: Neurosurgery;  Laterality: Bilateral;  Laminectomy - bilateral - L4-L5    LUMBAR WOUND DEBRIDEMENT N/A 02/02/2015   Procedure: LUMBAR WOUND DEBRIDEMENT, WASHOUT, CLOSURE;  Surgeon: Consuella Lose, MD;  Location: [place] [facility];  Service: Neurosurgery;  Laterality: N/A;  lumbar wound debridement   TUBAL LIGATION  04/05/2008   Patient Active Problem List    Diagnosis Date Noted   Current mild episode of major depressive disorder, unspecified whether recurrent ([place]) 01/30/2022   Carpal tunnel syndrome on left 11/28/2021   Numbness and tingling in left hand 10/11/2021   Abdominal cramping 09/26/2021   GAD (generalized anxiety disorder) 08/30/2017   Migraine without aura 08/10/2015   Spinal stenosis, lumbar 01/14/2015   Carpal tunnel syndrome 12/12/2013   HYPERLIPIDEMIA 12/05/2007   HYPERTENSION 12/05/2007   BRADYCARDIA 12/05/2007   DEGENERATIVE JOINT DISEASE 12/05/2007    ONSET DATE: 05/31/22 DOS   REFERRING DIAG: G56.02 (ICD-10-CM) - Carpal tunnel syndrome of left wrist   THERAPY DIAG:  No diagnosis found.  Rationale for Evaluation and Treatment: Rehabilitation  SUBJECTIVE:   SUBJECTIVE STATEMENT: She states ***.   Pt accompanied by: {accompnied:27141}  PERTINENT HISTORY: Almost 3+ weeks post op now Lt CTR.  She's previously had Rt CTR in 2015.    PRECAUTIONS: {Therapy precautions:24002}  WEIGHT BEARING RESTRICTIONS: {Yes ***/No:24003}  PAIN:  Are you having pain? *** in *** Rating: ***/10 at rest now, up to ***/10 at worst in past week  FALLS: Has patient fallen in last 6 months? {fallsyesno:27318}  LIVING ENVIRONMENT: Lives with: {OPRC lives with:25569::"lives with their family"} Lives in: {Lives in:25570} Stairs: {opstairs:27293} Has following equipment at home: {Assistive devices:23999}  PLOF: {PLOF:24004}  PATIENT GOALS: ***    OBJECTIVE: (All objective assessments below are from initial evaluation on: 06/19/22 unless otherwise specified.)    HAND DOMINANCE: Right ***  ADLs:  Overall ADLs: States decreased ability to grab, hold household objects, pain and inability to open containers, perform FMS tasks (manipulate fasteners on clothing), mild to moderate bathing problems as well. ***   FUNCTIONAL OUTCOME MEASURES: Eval: Quck DASH ***% impairment today  (Higher % Score  =  More Impairment)    Patient  Specific Functional Scale: *** (***, ***, ***)  (Higher Score  =  Better Ability for the Selected Tasks)     Patient Rated Wrist Evaluation (PRWE): Pain: ***/50; Function: ***/50; Total Score: ***/100 (Higher Score  =  More Pain and/or Debility)    UPPER EXTREMITY ROM     Shoulder to Wrist AROM Right eval Left eval  Shoulder flexion    Shoulder abduction    Shoulder extension    Shoulder internal rotation    Shoulder external rotation    Elbow flexion    Elbow extension    Forearm supination    Forearm pronation     Wrist flexion    Wrist extension    Wrist ulnar deviation    Wrist radial deviation    Functional dart thrower's motion (F-DTM) in ulnar flexion    F-DTM in radial extension     (Blank rows = not tested)   Hand AROM Right eval Left eval  Full Fist Ability (or Gap to Distal Palmar Crease)    Thumb Opposition to Small Finger (or Gap)    Thumb Opposition to Base of Small Finger (or Gap)     Thumb MCP (0-60)    Thumb IP (0-80)    Thumb Radial abd/add (0-55)     Thumb Palmar abd/add (0-45)     Index MCP (0-90)     Index PIP (0-100)     Index DIP (0-70)      Long MCP (0-90)      Long PIP (0-100)      Long DIP (0-70)      Ring MCP (0-90)      Ring PIP (0-100)      Ring DIP (0-70)      Little MCP (0-90)      Little PIP (0-100)      Little DIP (0-70)      (Blank rows = not tested)   UPPER EXTREMITY MMT:    Eval: *** NT at eval due to recent and still healing injuries. Will be tested when appropriate.   MMT Right TBD Left TBD  Shoulder flexion    Shoulder abduction    Shoulder adduction    Shoulder extension    Shoulder internal rotation    Shoulder external rotation    Middle trapezius    Lower trapezius    Elbow flexion    Elbow extension    Forearm supination    Forearm pronation    Wrist flexion    Wrist extension    Wrist ulnar deviation    Wrist radial deviation    (Blank rows = not tested)  HAND FUNCTION: Eval: Observed weakness  in affected hand.  Grip strength Right: *** lbs, Left: *** lbs   COORDINATION: Eval: Observed coordination impairments with affected hand. Box and Blocks Test: *** Blocks today (*** is [facility]); 9 Hole Peg Test Right: ***sec, Left: *** sec (*** sec is WFL)   SENSATION: Eval: *** Light touch intact today, though diminished around sx area    EDEMA:   Eval: *** Mildly swollen in hand and wrist today, ***cm circumferentially around ***  COGNITION: Eval: Overall cognitive status: [facility] for evaluation  today ***  OBSERVATIONS:   Eval: ***   TODAY'S TREATMENT:  Post-evaluation treatment: ***    PATIENT EDUCATION: Education details: See tx section above for details  Person educated: Patient Education method: Veterinary surgeon, Teach back, Handouts  Education comprehension: States and demonstrates understanding, Additional Education required    HOME EXERCISE PROGRAM: See tx section above for details    GOALS: Goals reviewed with patient? Yes  SHORT TERM GOALS: Target date: ***  Pt will demo/state understanding of initial home exercise program (HEP) to improve function, pain, and independence.   Baseline: Needs a plan for rehabilitation  Goal status: INITIAL  2.  Pt will obtain protective, custom or mobilizing orthotic for safety and to improve function.  Baseline: Needs orthotic support  Goal status: {GOALSTATUS:25110}   LONG TERM GOALS: Target date: ***  Pt will improve functional ability by decreased impairment per Quick DASH / PSFS / PRWE assessment to *** or better, for better quality of life. Baseline: *** Goal status: INITIAL  2.  Pt will improve A/ROM in *** to at least ***, to have prerequisite/functional motion for tasks like reach and grasp.  Baseline: *** Goal status: INITIAL  3.  Pt will improve strength in *** to at least *** MMT to have increased functional ability to carry out selfcare and higher-level homecare tasks with no difficulty Baseline: ***  MMT Goal status: INITIAL  4.  Pt will improve coordination skills in ***, as seen by increasing score on *** testing to at least *** to have increased functional ability to carry out fine motor tasks (fasteners, etc.) and more complex, coordinated IADLs (meal prep, sports, etc.).  Baseline: *** Goal status: INITIAL  5.  Pt will decrease pain at rest to ***/10 or better to have better sleep/rest and occupational participation in daily roles. Baseline: ***/10 pain at rest Goal status: INITIAL  6.  Pt will improve grip strength in effected hand to at least ***lbs for functional use at home and in IADLs. Baseline: *** lbs Goal status: INITIAL   ASSESSMENT:  CLINICAL IMPRESSION: Patient is a 46 y.o. female who was seen today for occupational therapy evaluation for Lt hand pain, weakness s/p CTS and release and decreased fnl ability. She will benefit from OP OT to increase quality of life.   PERFORMANCE DEFICITS: in functional skills including {OT physical skills:25468}, cognitive skills including {OT cognitive skills:25469}, and psychosocial skills including {OT psychosocial skills:25470}.   IMPAIRMENTS: are limiting patient from {OT performance deficits:25471}.   COMORBIDITIES: has co-morbidities such as HLD, HTN, DJD, Rt CTR, lumbar pain s/p laminectomies, and more  that affects occupational performance. Patient will benefit from skilled OT to address above impairments and improve overall function.  MODIFICATION OR ASSISTANCE TO COMPLETE EVALUATION: {OT modification:25474}  OT OCCUPATIONAL PROFILE AND HISTORY: {OT PROFILE AND HISTORY:25484}  CLINICAL DECISION MAKING: {OT CDM:25475}  REHAB POTENTIAL: {rehabpotential:25112}  EVALUATION COMPLEXITY: {Evaluation complexity:25115}      PLAN:  OT FREQUENCY: {rehab frequency:25116}  OT DURATION: {rehab duration:25117}  PLANNED INTERVENTIONS: {OT Interventions:25467}  RECOMMENDED OTHER SERVICES: ***  CONSULTED AND AGREED WITH  PLAN OF CARE: {BOF:75102}  PLAN FOR NEXT SESSION: ***   Benito Mccreedy, OTR/L, CHT 06/23/2022, 12:01 PM

## 2022-06-26 ENCOUNTER — Ambulatory Visit: Payer: Medicaid Other | Admitting: Rehabilitative and Restorative Service Providers"

## 2022-06-27 ENCOUNTER — Other Ambulatory Visit: Payer: Self-pay | Admitting: Physician Assistant

## 2022-06-27 ENCOUNTER — Encounter: Payer: Self-pay | Admitting: Orthopaedic Surgery

## 2022-06-27 DIAGNOSIS — I2510 Atherosclerotic heart disease of native coronary artery without angina pectoris: Secondary | ICD-10-CM | POA: Insufficient documentation

## 2022-06-27 HISTORY — PX: CARDIAC CATHETERIZATION: SHX172

## 2022-07-10 ENCOUNTER — Encounter: Payer: Self-pay | Admitting: Orthopaedic Surgery

## 2022-07-10 ENCOUNTER — Telehealth (HOSPITAL_COMMUNITY): Payer: Self-pay

## 2022-07-10 NOTE — Telephone Encounter (Signed)
Outside/paper referral received by Talbert Cage from [facility]. Will fax over Physician order and request further documents. Insurance benefits and eligibility to be determined.

## 2022-07-10 NOTE — Telephone Encounter (Signed)
Called patient to see if she is interested in the Cardiac Rehab Program. Patient expressed interest. Explained scheduling process and went over insurance, patient verbalized understanding.

## 2022-07-11 NOTE — Telephone Encounter (Signed)
We typically do not do outpatient PT for carpal  tunnel.  Does she feel like she needs PT?  Certainly happy to put in referral if so

## 2022-07-25 ENCOUNTER — Ambulatory Visit (INDEPENDENT_AMBULATORY_CARE_PROVIDER_SITE_OTHER): Payer: Medicaid Other | Admitting: Physician Assistant

## 2022-07-25 DIAGNOSIS — M542 Cervicalgia: Secondary | ICD-10-CM | POA: Diagnosis not present

## 2022-07-25 MED ORDER — TRAMADOL HCL 50 MG PO TABS: 50.0000 mg | ORAL_TABLET | Freq: Three times a day (TID) | ORAL | 0 refills | Status: DC | PRN

## 2022-07-25 MED ORDER — PREDNISONE 10 MG (21) PO TBPK: ORAL_TABLET | ORAL | 0 refills | Status: DC

## 2022-07-25 MED ORDER — METHOCARBAMOL 750 MG PO TABS: 750.0000 mg | ORAL_TABLET | Freq: Two times a day (BID) | ORAL | 2 refills | Status: DC | PRN

## 2022-07-25 NOTE — Progress Notes (Signed)
Office Visit Note   Patient: [name]           Date of Birth: [birth year]           MRN: [id number] Visit Date: 07/25/2022              Requested by: Minette Brine, [address] [facility] [place],  [place] [id number] PCP: Minette Brine, FNP   Assessment & Plan: Visit Diagnoses:  1. Cervicalgia     Plan: Impression is chronic left-sided neck pain.  At this point, would like to start the patient with a steroid pack and muscle relaxer as well as start a course of formal physical therapy.  She is agreeable to this plan.  If her symptoms do not improve over the next 6 to 8 weeks or worsen in the meantime, she will let me know we will obtain MRI of the cervical spine to assess for structural abnormalities.  Otherwise, follow-up as needed.  Follow-Up Instructions: Return if symptoms worsen or fail to improve.   Orders:  Orders Placed This Encounter  Procedures   Ambulatory referral to Physical Therapy   Meds ordered this encounter  Medications   predniSONE (STERAPRED UNI-PAK 21 TAB) 10 MG (21) TBPK tablet    Sig: Take as directed    Dispense:  21 tablet    Refill:  0   methocarbamol (ROBAXIN-750) 750 MG tablet    Sig: Take 1 tablet (750 mg total) by mouth 2 (two) times daily as needed for muscle spasms.    Dispense:  20 tablet    Refill:  2   traMADol (ULTRAM) 50 MG tablet    Sig: Take 1 tablet (50 mg total) by mouth 3 (three) times daily as needed.    Dispense:  30 tablet    Refill:  0      Procedures: No procedures performed   Clinical Data: No additional findings.   Subjective: Chief Complaint  Patient presents with   Neck - Pain   Left Shoulder - Pain    HPI patient is a pleasant 47 year old female who comes in today with chronic left-sided neck pain radiating to the top of the left and right shoulder.  This is been ongoing for several months.  The pain is constant with a feeling of pressure in the neck.  Symptoms are worse when she is lying down  as well as when she extends her neck.  She denies any weakness to either upper extremity.  She does note a burning sensation which occurs to the back of her neck at times.  She has tried Robaxin which does seem to help.  She has not been to outpatient physical therapy.  Review of Systems as detailed in HPI.  All others reviewed and are negative.   Objective: Vital Signs: LMP 09/17/2010   Physical Exam well-developed well-nourished female no acute distress.  Alert and oriented x 3.  Ortho Exam cervical spine exam reveals no spinous or paraspinous tenderness.  She does have slight tenderness to the left parascapular region.  She has increased pain with cervical spine extension and left-sided rotation.  Unremarkable left shoulder exam.  No focal weakness.  She is neurovascular tact distally.  Specialty Comments:  No specialty comments available.  Imaging: No new imaging   PMFS History: Patient Active Problem List   Diagnosis Date Noted   Current mild episode of major depressive disorder, unspecified whether recurrent ([place]) 01/30/2022   Carpal tunnel syndrome on left 11/28/2021  Numbness and tingling in left hand 10/11/2021   Abdominal cramping 09/26/2021   GAD (generalized anxiety disorder) 08/30/2017   Migraine without aura 08/10/2015   Spinal stenosis, lumbar 01/14/2015   Carpal tunnel syndrome 12/12/2013   HYPERLIPIDEMIA 12/05/2007   HYPERTENSION 12/05/2007   BRADYCARDIA 12/05/2007   DEGENERATIVE JOINT DISEASE 12/05/2007   Past Medical History:  Diagnosis Date   Anxiety    Cancer [facility])    ovarian cancer   Carpal tunnel syndrome of right wrist 11/2013   Depression    Ganglion cyst of wrist 11/2013   right   Hypertension    is on 3 meds. - states BP is under control; has been on med. x 12 yr.   Migraines    Right tennis elbow 11/2013    Family History  Problem Relation Age of Onset   Hypertension Mother    Diabetes Mother    Cancer Mother    Diabetes Brother     Hypertension Brother    Diabetes Brother    Hypertension Maternal Grandmother    Heart attack Paternal Grandmother    Cancer Other     Past Surgical History:  Procedure Laterality Date   ABDOMINAL HYSTERECTOMY     complete   CARPAL TUNNEL RELEASE Right 12/12/2013   Procedure: ENDOSCOPY RIGHT WRIST CARPAL TUNNEL RELEASE EXCISION GANGLION WRIST PRIMARY ;  Surgeon: Renette Butters, MD;  Location: [place];  Service: Orthopedics;  Laterality: Right;   CARPAL TUNNEL RELEASE Left 05/31/2022   Procedure: LEFT CARPAL TUNNEL RELEASE;  Surgeon: Leandrew Koyanagi, MD;  Location: [place];  Service: Orthopedics;  Laterality: Left;   CESAREAN SECTION  03/11/2005; 04/05/2008   CHOLECYSTECTOMY  02/11/2004   lap. chole.   LATERAL EPICONDYLE RELEASE Right 12/12/2013   Procedure: RIGHT TENNIS ELBOW RELEASE WITH BONE REPAIR;  Surgeon: Renette Butters, MD;  Location: [place];  Service: Orthopedics;  Laterality: Right;   LUMBAR LAMINECTOMY WITH COFLEX 1 LEVEL Bilateral 01/14/2015   Procedure: Laminectomy - bilateral - L4-L5 ;  Surgeon: Karie Chimera, MD;  Location: [place] [facility];  Service: Neurosurgery;  Laterality: Bilateral;  Laminectomy - bilateral - L4-L5    LUMBAR WOUND DEBRIDEMENT N/A 02/02/2015   Procedure: LUMBAR WOUND DEBRIDEMENT, WASHOUT, CLOSURE;  Surgeon: Consuella Lose, MD;  Location: [place] [facility];  Service: Neurosurgery;  Laterality: N/A;  lumbar wound debridement   TUBAL LIGATION  04/05/2008   Social History   Occupational History   Not on file  Tobacco Use   Smoking status: Never   Smokeless tobacco: Never  Vaping Use   Vaping Use: Never used  Substance and Sexual Activity   Alcohol use: No   Drug use: No   Sexual activity: Not on file

## 2022-08-01 ENCOUNTER — Other Ambulatory Visit: Payer: Self-pay | Admitting: Nurse Practitioner

## 2022-08-01 ENCOUNTER — Encounter: Payer: Self-pay | Admitting: Nurse Practitioner

## 2022-08-02 ENCOUNTER — Other Ambulatory Visit: Payer: Self-pay

## 2022-08-02 DIAGNOSIS — I10 Essential (primary) hypertension: Secondary | ICD-10-CM

## 2022-08-02 MED ORDER — AMLODIPINE BESYLATE 10 MG PO TABS: 10.0000 mg | ORAL_TABLET | Freq: Every day | ORAL | 0 refills | Status: DC

## 2022-08-02 MED ORDER — LOSARTAN POTASSIUM 100 MG PO TABS: 100.0000 mg | ORAL_TABLET | Freq: Every day | ORAL | 1 refills | Status: DC

## 2022-08-02 MED ORDER — ESTRADIOL 2 MG PO TABS: 2.0000 mg | ORAL_TABLET | Freq: Every day | ORAL | 1 refills | Status: DC

## 2022-08-03 NOTE — Therapy (Signed)
OUTPATIENT PHYSICAL THERAPY CERVICAL EVALUATION   Patient Name: [name] MRN: [id number] DOB:1975-07-28, 47 y.o., female Today's Date: 08/05/2022  END OF SESSION:  PT End of Session - 08/04/22 1155     Visit Number 1    Number of Visits 13    Date for PT Re-Evaluation 09/22/22    Authorization Type [place] MEDICA[employer]    PT Start Time 1150    PT Stop Time 1230    PT Time Calculation (min) 40 min    Activity Tolerance Patient tolerated treatment well    Behavior During Therapy WFL for tasks assessed/performed             Past Medical History:  Diagnosis Date   Anxiety    Cancer ([place])    ovarian cancer   Carpal tunnel syndrome of right wrist 11/2013   Depression    Ganglion cyst of wrist 11/2013   right   Hypertension    is on 3 meds. - states BP is under control; has been on med. x 12 yr.   Migraines    Right tennis elbow 11/2013   Past Surgical History:  Procedure Laterality Date   ABDOMINAL HYSTERECTOMY     complete   CARPAL TUNNEL RELEASE Right 12/12/2013   Procedure: ENDOSCOPY RIGHT WRIST CARPAL TUNNEL RELEASE EXCISION GANGLION WRIST PRIMARY ;  Surgeon: Renette Butters, MD;  Location: [place];  Service: Orthopedics;  Laterality: Right;   CARPAL TUNNEL RELEASE Left 05/31/2022   Procedure: LEFT CARPAL TUNNEL RELEASE;  Surgeon: Leandrew Koyanagi, MD;  Location: [place];  Service: Orthopedics;  Laterality: Left;   CESAREAN SECTION  03/11/2005; 04/05/2008   CHOLECYSTECTOMY  02/11/2004   lap. chole.   LATERAL EPICONDYLE RELEASE Right 12/12/2013   Procedure: RIGHT TENNIS ELBOW RELEASE WITH BONE REPAIR;  Surgeon: Renette Butters, MD;  Location: [place];  Service: Orthopedics;  Laterality: Right;   LUMBAR LAMINECTOMY WITH COFLEX 1 LEVEL Bilateral 01/14/2015   Procedure: Laminectomy - bilateral - L4-L5 ;  Surgeon: Karie Chimera, MD;  Location: [place] [facility];  Service: Neurosurgery;  Laterality: Bilateral;  Laminectomy -  bilateral - L4-L5    LUMBAR WOUND DEBRIDEMENT N/A 02/02/2015   Procedure: LUMBAR WOUND DEBRIDEMENT, WASHOUT, CLOSURE;  Surgeon: Consuella Lose, MD;  Location: [place] [facility];  Service: Neurosurgery;  Laterality: N/A;  lumbar wound debridement   TUBAL LIGATION  04/05/2008   Patient Active Problem List   Diagnosis Date Noted   Current mild episode of major depressive disorder, unspecified whether recurrent ([place]) 01/30/2022   Carpal tunnel syndrome on left 11/28/2021   Numbness and tingling in left hand 10/11/2021   Abdominal cramping 09/26/2021   GAD (generalized anxiety disorder) 08/30/2017   Migraine without aura 08/10/2015   Spinal stenosis, lumbar 01/14/2015   Carpal tunnel syndrome 12/12/2013   HYPERLIPIDEMIA 12/05/2007   HYPERTENSION 12/05/2007   BRADYCARDIA 12/05/2007   DEGENERATIVE JOINT DISEASE 12/05/2007    PCP: Minette Brine, FNP  REFERRING PROVIDER: Aundra Dubin, PA-C   REFERRING DIAG: M54.2 (ICD-10-CM) - Cervicalgia   THERAPY DIAG:  Cervicalgia  Cramp and spasm  Abnormal posture  Rationale for Evaluation and Treatment: Rehabilitation  ONSET DATE: Approx 1 year  SUBJECTIVE:  SUBJECTIVE STATEMENT: My neck hurts and I can't sleep at night. It bother me during the day also, but mainly at night. Pt sleeps primarily on her side, but also on her stomach and back. Pt endores pain to her L shoulder and arm to elbow. Pt denies N/T. Pt denies a specific MOI.  PERTINENT HISTORY:  HTN; carpal tunnel release R 11/25/13, L 05/31/22; Lumbar laminectomy 01/14/15   PAIN:  Are you having pain?  Yes: NPRS scale: 6/10 Pain location: CT junction Pain description: Burning Aggravating factors: Sleeping, hurts all the time Relieving factors: Stretching my neck back At night  8-9/10  PRECAUTIONS: None  WEIGHT BEARING RESTRICTIONS: No  FALLS:  Has patient fallen in last 6 months? No  LIVING ENVIRONMENT: Lives with: lives with their family Lives in: House/apartment Pt reports she is able to access and be mobilie within her home  OCCUPATION: Billing-computer work  PLOF: Independent  PATIENT GOALS: Pain relief  NEXT MD VISIT:  To return if needed  OBJECTIVE:   DIAGNOSTIC FINDINGS:  05/24/22 X-rays demonstrate moderate degenerative changes C5-6 C6-7 with  straightening of the cervical spine  PATIENT SURVEYS:  NDI 17/50= 34%  Moderate disability  COGNITION: Overall cognitive status: Within functional limits for tasks assessed  SENSATION: WFL  POSTURE: rounded shoulders, forward head, and increased thoracic kyphosis, and CT step off  PALPATION: TTP peri-CT junction and paraspinal and upper trap areas   CERVICAL ROM:   Active ROM A/PROM (deg) eval  Flexion 43 no P  Extension 44 P ant  Right lateral flexion 45 pull L  Left lateral flexion 40 pull R  Right rotation 60 no P  Left rotation 50 np P  P=provocation  (Blank rows = not tested)  UPPER EXTREMITY ROM: WNLs and equal Active ROM Right eval Left eval  Shoulder flexion    Shoulder extension    Shoulder abduction    Shoulder adduction    Shoulder extension    Shoulder internal rotation    Shoulder external rotation    Elbow flexion    Elbow extension    Wrist flexion    Wrist extension    Wrist ulnar deviation    Wrist radial deviation    Wrist pronation    Wrist supination     (Blank rows = not tested)  UPPER EXTREMITY MMT: Myotome screen negative MMT Right eval Left eval  Shoulder flexion    Shoulder extension    Shoulder abduction    Shoulder adduction    Shoulder extension    Shoulder internal rotation    Shoulder external rotation    Middle trapezius    Lower trapezius    Elbow flexion    Elbow extension    Wrist flexion    Wrist extension    Wrist  ulnar deviation    Wrist radial deviation    Wrist pronation    Wrist supination    Grip strength 64,40 19, 19 capal tunnel sugery 11/*/23   (Blank rows = not tested)  CERVICAL SPECIAL TESTS:  Neck flexor muscle endurance test: Positive, Spurling's test: Negative, and Distraction test: Negative DNF=11"  FUNCTIONAL TESTS:  NT  TODAY'S TREATMENT:  [facility] Adult PT Treatment:                                                DATE: 08/04/22 Therapeutic Exercise: Developed, instructed in, and pt completed therex as noted in HEP  Self Care: Sleeping positions and support for more restful sleep    PATIENT EDUCATION:  Education details: Eval findings, POC, HEP, self care  Person educated: Patient Education method: Explanation, Demonstration, Tactile cues, Verbal cues, and Handouts Education comprehension: verbalized understanding, returned demonstration, verbal cues required, and tactile cues required  HOME EXERCISE PROGRAM: Access Code: [email or web address] URL: [email or web address] Date: 08/04/2022 Prepared by: Gar Ponto  Exercises - Standing Cervical Retraction  - 6 x daily - 7 x weekly - 3 sets - 3-5 reps - 3 hold  ASSESSMENT:  CLINICAL IMPRESSION: Patient is a 47 y.o. female who was seen today for physical therapy evaluation and treatment for M54.2 (ICD-10-CM) - Cervicalgia . Pt presents with postural dysfunction, decreased ROM, decreased strength, increased muscle tension of the upper traps and CT junction paraspinals, and impaired sleep due to pain. Pt will benefit from skilled PT to address impairments for improved neck function with less pain.  OBJECTIVE IMPAIRMENTS: decreased ROM, decreased strength, increased fascial restrictions, increased muscle spasms, postural dysfunction, and obesity.   ACTIVITY LIMITATIONS: carrying, lifting, reach over head,  and caring for others  PARTICIPATION LIMITATIONS: meal prep, cleaning, laundry, driving, and occupation  PERSONAL FACTORS: Past/current experiences, Profession, Time since onset of injury/illness/exacerbation, and 1 comorbidity: carpal tunnel releases  are also affecting patient's functional outcome.   REHAB POTENTIAL: Good  CLINICAL DECISION MAKING: Evolving/moderate complexity  EVALUATION COMPLEXITY: Moderate   GOALS:  SHORT TERM GOALS = LTGs  LONG TERM GOALS: Target date: 09/22/22  Pt will be Ind in a final HEP to maintain achieved LOF Baseline: initiated Goal status: INITIAL  2.  Increase cervical side bending by 5d and rotation by 10d for improved neck function c less pain Baseline: see flow sheets Goal status: INITIAL  3.  Increase DNF endurance to 30" for improved neck function c less pain Baseline: 11" Goal status: INITIAL  4.  Pt will report improve quality of with pt able to sleep comfortably for 6 hours Baseline: 2 hours Goal status: INITIAL  5.  Pt will report a decrease in neck pain to 4/10 or less with daily activities  Baseline: 3-9/10 Goal status: INITIAL  6.  Pt's NDI score will improve to 12/50, 24% Min disability or less as indication of improved function Baseline: 17/50 35% Mod disability Goal status: INITIAL   PLAN:  PT FREQUENCY: 2x/week  PT DURATION: 6 weeks  PLANNED INTERVENTIONS: Therapeutic exercises, Therapeutic activity, Patient/Family education, Self Care, Aquatic Therapy, Dry Needling, Electrical stimulation, Spinal manipulation, Spinal mobilization, Cryotherapy, Moist heat, Taping, Vasopneumatic device, Traction, Ultrasound, Ionotophoresis '4mg'$ /ml Dexamethasone, Manual therapy, and Re-evaluation  PLAN FOR NEXT SESSION: Assess response to HEP; progress therex as indicated; use of modalities, manual therapy; and TPDN as indicated.  Lashe Oliveira MS, PT 08/05/22 2:31 PM  [employer] Authorization   Choose one:  Rehabilitative  Standardized Assessment or Functional Outcome Tool: See Pain Assessment and NDI  Score or Percent Disability:  34%  Body Parts Treated (Select each separately):  Cervicothoracic. Overall deficits/functional limitations for body part selected: moderate  If treatment provided at initial evaluation, no treatment charged due to lack of  authorization.

## 2022-08-04 ENCOUNTER — Ambulatory Visit: Payer: Medicaid Other | Attending: Physician Assistant

## 2022-08-04 ENCOUNTER — Other Ambulatory Visit: Payer: Self-pay

## 2022-08-04 DIAGNOSIS — R293 Abnormal posture: Secondary | ICD-10-CM | POA: Diagnosis present

## 2022-08-04 DIAGNOSIS — M542 Cervicalgia: Secondary | ICD-10-CM | POA: Insufficient documentation

## 2022-08-04 DIAGNOSIS — R252 Cramp and spasm: Secondary | ICD-10-CM | POA: Insufficient documentation

## 2022-08-10 NOTE — Therapy (Signed)
OUTPATIENT PHYSICAL THERAPY TREATMENT NOTE   Patient Name: [name] MRN: [id number] DOB:08-Jun-1976, 47 y.o., female Today's Date: 08/11/2022  PCP: Minette Brine, FNP   REFERRING PROVIDER: Aundra Dubin, PA-C   END OF SESSION:   PT End of Session - 08/11/22 0942     Visit Number 2    Number of Visits 13    Date for PT Re-Evaluation 09/22/22    Authorization Type [place] [employer]ICA[employer] [facility]    Authorization Time Period 14 visits    PT Start Time 0937    PT Stop Time 1020    PT Time Calculation (min) 43 min    Activity Tolerance Patient tolerated treatment well    Behavior During Therapy [facility] for tasks assessed/performed             Past Medical History:  Diagnosis Date   Anxiety    Cancer ([place])    ovarian cancer   Carpal tunnel syndrome of right wrist 11/2013   Depression    Ganglion cyst of wrist 11/2013   right   Hypertension    is on 3 meds. - states BP is under control; has been on med. x 12 yr.   Migraines    Right tennis elbow 11/2013   Past Surgical History:  Procedure Laterality Date   ABDOMINAL HYSTERECTOMY     complete   CARPAL TUNNEL RELEASE Right 12/12/2013   Procedure: ENDOSCOPY RIGHT WRIST CARPAL TUNNEL RELEASE EXCISION GANGLION WRIST PRIMARY ;  Surgeon: Renette Butters, MD;  Location: [place];  Service: Orthopedics;  Laterality: Right;   CARPAL TUNNEL RELEASE Left 05/31/2022   Procedure: LEFT CARPAL TUNNEL RELEASE;  Surgeon: Leandrew Koyanagi, MD;  Location: [place];  Service: Orthopedics;  Laterality: Left;   CESAREAN SECTION  03/11/2005; 04/05/2008   CHOLECYSTECTOMY  02/11/2004   lap. chole.   LATERAL EPICONDYLE RELEASE Right 12/12/2013   Procedure: RIGHT TENNIS ELBOW RELEASE WITH BONE REPAIR;  Surgeon: Renette Butters, MD;  Location: [place];  Service: Orthopedics;  Laterality: Right;   LUMBAR LAMINECTOMY WITH COFLEX 1 LEVEL Bilateral 01/14/2015   Procedure: Laminectomy - bilateral - L4-L5 ;   Surgeon: Karie Chimera, MD;  Location: [place] [facility];  Service: Neurosurgery;  Laterality: Bilateral;  Laminectomy - bilateral - L4-L5    LUMBAR WOUND DEBRIDEMENT N/A 02/02/2015   Procedure: LUMBAR WOUND DEBRIDEMENT, WASHOUT, CLOSURE;  Surgeon: Consuella Lose, MD;  Location: [place] [facility];  Service: Neurosurgery;  Laterality: N/A;  lumbar wound debridement   TUBAL LIGATION  04/05/2008   Patient Active Problem List   Diagnosis Date Noted   Current mild episode of major depressive disorder, unspecified whether recurrent ([place]) 01/30/2022   Carpal tunnel syndrome on left 11/28/2021   Numbness and tingling in left hand 10/11/2021   Abdominal cramping 09/26/2021   GAD (generalized anxiety disorder) 08/30/2017   Migraine without aura 08/10/2015   Spinal stenosis, lumbar 01/14/2015   Carpal tunnel syndrome 12/12/2013   HYPERLIPIDEMIA 12/05/2007   HYPERTENSION 12/05/2007   BRADYCARDIA 12/05/2007   DEGENERATIVE JOINT DISEASE 12/05/2007    REFERRING DIAG: Cervicalgia   THERAPY DIAG:  Cervicalgia  Cramp and spasm  Abnormal posture  Rationale for Evaluation and Treatment Rehabilitation   ONSET DATE: Approx 1 year   SUBJECTIVE:  SUBJECTIVE STATEMENT: Pt reports her neck pain has not significantly improved.   PAIN:  Are you having pain?  Yes: NPRS scale: 7/10 Pain location: CT junction Pain description: Burning Aggravating factors: Sleeping, hurts all the time Relieving factors: Stretching my neck back At night 8-9/10  PERTINENT HISTORY:  HTN; carpal tunnel release R 11/25/13, L 05/31/22; Lumbar laminectomy 01/14/15    PRECAUTIONS: None   WEIGHT BEARING RESTRICTIONS: No   FALLS:  Has patient fallen in last 6 months? No   LIVING ENVIRONMENT: Lives with: lives with their family Lives  in: House/apartment Pt reports she is able to access and be mobilie within her home   OCCUPATION: Billing-computer work   PLOF: Independent   PATIENT GOALS: Pain relief   NEXT MD VISIT:  To return if needed   OBJECTIVE: (objective measures completed at initial evaluation unless otherwise dated)   DIAGNOSTIC FINDINGS:  05/24/22 X-rays demonstrate moderate degenerative changes C5-6 C6-7 with  straightening of the cervical spine   PATIENT SURVEYS:  NDI 17/50= 34%  Moderate disability   COGNITION: Overall cognitive status: Within functional limits for tasks assessed   SENSATION: WFL   POSTURE: rounded shoulders, forward head, and increased thoracic kyphosis, and CT step off   PALPATION: TTP peri-CT junction and paraspinal and upper trap areas        CERVICAL ROM:    Active ROM A/PROM (deg) eval  Flexion 43 no P  Extension 44 P ant  Right lateral flexion 45 pull L  Left lateral flexion 40 pull R  Right rotation 60 no P  Left rotation 50 np P  P=provocation  (Blank rows = not tested)   UPPER EXTREMITY ROM: WNLs and equal Active ROM Right eval Left eval  Shoulder flexion      Shoulder extension      Shoulder abduction      Shoulder adduction      Shoulder extension      Shoulder internal rotation      Shoulder external rotation      Elbow flexion      Elbow extension      Wrist flexion      Wrist extension      Wrist ulnar deviation      Wrist radial deviation      Wrist pronation      Wrist supination       (Blank rows = not tested)   UPPER EXTREMITY MMT: Myotome screen negative MMT Right eval Left eval  Shoulder flexion      Shoulder extension      Shoulder abduction      Shoulder adduction      Shoulder extension      Shoulder internal rotation      Shoulder external rotation      Middle trapezius      Lower trapezius      Elbow flexion      Elbow extension      Wrist flexion      Wrist extension      Wrist ulnar deviation      Wrist  radial deviation      Wrist pronation      Wrist supination      Grip strength 64,40 19, 19 capal tunnel sugery 11/*/23   (Blank rows = not tested)   CERVICAL SPECIAL TESTS:  Neck flexor muscle endurance test: Positive, Spurling's test: Negative, and Distraction test: Negative DNF=11"   FUNCTIONAL TESTS:  NT   TODAY'S TREATMENT:  [facility]  Adult PT Treatment:                                                DATE: 08/11/22 Therapeutic Exercise: Supine Cervical Retraction 5 reps - 5 hold Supine DNF Liftoffs 5 reps  5 hold Standing Cervical Retraction  5 reps 5 hold Seated Cervical Sidebending Stretch  3 reps - 15 hold each Standing Cervical Retraction with Side bending 3 reps - 15 hold each Updated HEP Manual Therapy: STM to the cervical paraspinals and upper traps Suboccipital release Cervical traction Skilled palpation to identify TrPs and taut muscle bands Trigger Point Dry Needling Treatment: Pre-treatment instruction: Patient instructed on dry needling rationale, procedures, and possible side effects including pain during treatment (achy,cramping feeling), bruising, drop of blood, lightheadedness, nausea, sweating. Patient Consent Given: Yes Education handout provided: Yes Muscles treated: L and R suboccipitals  and upper cervical paraspinals Needle size and number: .30x59m x 2 .30x524mx 2 Electrical stimulation performed: No Parameters: N/A Treatment response/outcome: Twitch response elicited Post-treatment instructions: Patient instructed to expect possible mild to moderate muscle soreness later today and/or tomorrow. Patient instructed in methods to reduce muscle soreness and to continue prescribed HEP. If patient was dry needled over the lung field, patient was instructed on signs and symptoms of pneumothorax and, however unlikely, to see immediate medical attention should they occur. Patient was also educated on signs and symptoms of infection and to seek medical attention  should they occur. Patient verbalized understanding of these instructions and education.                                                                                                                               [facility]                                                DATE: 08/04/22 Therapeutic Exercise: Developed, instructed in, and pt completed therex as noted in HEP  Self Care: Sleeping positions and support for more restful sleep     PATIENT EDUCATION:  Education details: Eval findings, POC, HEP, self care  Person educated: Patient Education method: Explanation, Demonstration, Tactile cues, Verbal cues, and Handouts Education comprehension: verbalized understanding, returned demonstration, verbal cues required, and tactile cues required   HOME EXERCISE PROGRAM: Access Code: [email or web address] URL: [email or web address] Date: 08/11/2022 Prepared by: AlGar PontoExercises - Standing Cervical Retraction  - 6 x daily - 7 x weekly - 3 sets - 3-5 reps - 5 hold - Supine Cervical Retraction with Towel  - 2 x daily - 7 x weekly - 1 sets - 5 reps - 5 hold - Supine DNF Liftoffs  - 2 x daily -  7 x weekly - 1 sets - 5 reps - 5 hold - Seated Cervical Sidebending Stretch  - 6 x daily - 7 x weekly - 1 sets - 3-5 reps - 15 hold - Standing Cervical Retraction with Sidebending  - 6 x daily - 7 x weekly - 1 sets - 3-5 reps - 15 hold   ASSESSMENT:   CLINICAL IMPRESSION: Pt returns to PT for her 1st visit following the eval. PT was completed foe manual therapy as noted above f/b TPDN to the bilat suboccipital and upper cervical paraspinals. Twitch responses were elicited. Pt then completed therex for postural and posterior chain strengthening and cervical ROM/flexibility. Pt tolerated PT today without adverse effects. Will assess pt's complete response to the TPDN her next session.     OBJECTIVE IMPAIRMENTS: decreased ROM, decreased strength, increased fascial restrictions,  increased muscle spasms, postural dysfunction, and obesity.    ACTIVITY LIMITATIONS: carrying, lifting, reach over head, and caring for others   PARTICIPATION LIMITATIONS: meal prep, cleaning, laundry, driving, and occupation   PERSONAL FACTORS: Past/current experiences, Profession, Time since onset of injury/illness/exacerbation, and 1 comorbidity: carpal tunnel releases  are also affecting patient's functional outcome.    REHAB POTENTIAL: Good   CLINICAL DECISION MAKING: Evolving/moderate complexity   EVALUATION COMPLEXITY: Moderate     GOALS:   SHORT TERM GOALS = LTGs   LONG TERM GOALS: Target date: 09/22/22   Pt will be Ind in a final HEP to maintain achieved LOF Baseline: initiated Goal status: INITIAL   2.  Increase cervical side bending by 5d and rotation by 10d for improved neck function c less pain Baseline: see flow sheets Goal status: INITIAL   3.  Increase DNF endurance to 30" for improved neck function c less pain Baseline: 11" Goal status: INITIAL   4.  Pt will report improve quality of with pt able to sleep comfortably for 6 hours Baseline: 2 hours Goal status: INITIAL   5.  Pt will report a decrease in neck pain to 4/10 or less with daily activities  Baseline: 3-9/10 Goal status: INITIAL   6.  Pt's NDI score will improve to 12/50, 24% Min disability or less as indication of improved function Baseline: 17/50 35% Mod disability Goal status: INITIAL     PLAN:   PT FREQUENCY: 2x/week   PT DURATION: 6 weeks   PLANNED INTERVENTIONS: Therapeutic exercises, Therapeutic activity, Patient/Family education, Self Care, Aquatic Therapy, Dry Needling, Electrical stimulation, Spinal manipulation, Spinal mobilization, Cryotherapy, Moist heat, Taping, Vasopneumatic device, Traction, Ultrasound, Ionotophoresis '4mg'$ /ml Dexamethasone, Manual therapy, and Re-evaluation   PLAN FOR NEXT SESSION: Assess response to HEP; progress therex as indicated; use of modalities,  manual therapy; and TPDN as indicated. Computer station set up. Tennis balls for massage at home.   Tanya Marvin MS, PT 08/11/22 2:29 PM

## 2022-08-11 ENCOUNTER — Ambulatory Visit: Payer: Medicaid Other

## 2022-08-11 DIAGNOSIS — M542 Cervicalgia: Secondary | ICD-10-CM | POA: Diagnosis not present

## 2022-08-11 DIAGNOSIS — R293 Abnormal posture: Secondary | ICD-10-CM

## 2022-08-11 DIAGNOSIS — R252 Cramp and spasm: Secondary | ICD-10-CM

## 2022-08-11 NOTE — Patient Instructions (Signed)

## 2022-08-16 ENCOUNTER — Ambulatory Visit: Payer: Medicaid Other

## 2022-08-16 DIAGNOSIS — M542 Cervicalgia: Secondary | ICD-10-CM

## 2022-08-16 DIAGNOSIS — R293 Abnormal posture: Secondary | ICD-10-CM

## 2022-08-16 DIAGNOSIS — R252 Cramp and spasm: Secondary | ICD-10-CM

## 2022-08-16 NOTE — Therapy (Signed)
OUTPATIENT PHYSICAL THERAPY TREATMENT NOTE   Patient Name: [name] MRN: [id number] DOB:[birth year], 47 y.o., female Today's Date: 08/16/2022  PCP: Minette Brine, FNP   REFERRING PROVIDER: Aundra Dubin, PA-C   END OF SESSION:   PT End of Session - 08/16/22 1811     Visit Number 3    Number of Visits 13    Date for PT Re-Evaluation 09/22/22    Authorization Type [place] MEDICAID [facility]    Authorization Time Period 14 visits    PT Start Time 1505    PT Stop Time 1547    PT Time Calculation (min) 42 min    Activity Tolerance Patient tolerated treatment well    Behavior During Therapy WFL for tasks assessed/performed             Past Medical History:  Diagnosis Date   Anxiety    Cancer ([place])    ovarian cancer   Carpal tunnel syndrome of right wrist 11/2013   Depression    Ganglion cyst of wrist 11/2013   right   Hypertension    is on 3 meds. - states BP is under control; has been on med. x 12 yr.   Migraines    Right tennis elbow 11/2013   Past Surgical History:  Procedure Laterality Date   ABDOMINAL HYSTERECTOMY     complete   CARPAL TUNNEL RELEASE Right 12/12/2013   Procedure: ENDOSCOPY RIGHT WRIST CARPAL TUNNEL RELEASE EXCISION GANGLION WRIST PRIMARY ;  Surgeon: Renette Butters, MD;  Location: [place];  Service: Orthopedics;  Laterality: Right;   CARPAL TUNNEL RELEASE Left 05/31/2022   Procedure: LEFT CARPAL TUNNEL RELEASE;  Surgeon: Leandrew Koyanagi, MD;  Location: [place];  Service: Orthopedics;  Laterality: Left;   CESAREAN SECTION  03/11/2005; 04/05/2008   CHOLECYSTECTOMY  02/11/2004   lap. chole.   LATERAL EPICONDYLE RELEASE Right 12/12/2013   Procedure: RIGHT TENNIS ELBOW RELEASE WITH BONE REPAIR;  Surgeon: Renette Butters, MD;  Location: [place];  Service: Orthopedics;  Laterality: Right;   LUMBAR LAMINECTOMY WITH COFLEX 1 LEVEL Bilateral 01/14/2015   Procedure: Laminectomy - bilateral - L4-L5 ;   Surgeon: Karie Chimera, MD;  Location: [place] [facility];  Service: Neurosurgery;  Laterality: Bilateral;  Laminectomy - bilateral - L4-L5    LUMBAR WOUND DEBRIDEMENT N/A 02/02/2015   Procedure: LUMBAR WOUND DEBRIDEMENT, WASHOUT, CLOSURE;  Surgeon: Consuella Lose, MD;  Location: [place] [facility];  Service: Neurosurgery;  Laterality: N/A;  lumbar wound debridement   TUBAL LIGATION  04/05/2008   Patient Active Problem List   Diagnosis Date Noted   Current mild episode of major depressive disorder, unspecified whether recurrent ([place]) 01/30/2022   Carpal tunnel syndrome on left 11/28/2021   Numbness and tingling in left hand 10/11/2021   Abdominal cramping 09/26/2021   GAD (generalized anxiety disorder) 08/30/2017   Migraine without aura 08/10/2015   Spinal stenosis, lumbar 01/14/2015   Carpal tunnel syndrome 12/12/2013   HYPERLIPIDEMIA 12/05/2007   HYPERTENSION 12/05/2007   BRADYCARDIA 12/05/2007   DEGENERATIVE JOINT DISEASE 12/05/2007    REFERRING DIAG: Cervicalgia   THERAPY DIAG:  Cervicalgia  Cramp and spasm  Abnormal posture  Rationale for Evaluation and Treatment Rehabilitation   ONSET DATE: Approx 1 year   SUBJECTIVE:  SUBJECTIVE STATEMENT: Pt found the TPDN to be helpful   PAIN:  Are you having pain?  Yes: NPRS scale: 5/10 Pain location: CT junction Pain description: Burning Aggravating factors: Sleeping, hurts all the time Relieving factors: Stretching my neck back At night 8-9/10  PERTINENT HISTORY:  HTN; carpal tunnel release R 11/25/13, L 05/31/22; Lumbar laminectomy 01/14/15    PRECAUTIONS: None   WEIGHT BEARING RESTRICTIONS: No   FALLS:  Has patient fallen in last 6 months? No   LIVING ENVIRONMENT: Lives with: lives with their family Lives in: House/apartment Pt  reports she is able to access and be mobilie within her home   OCCUPATION: Billing-computer work   PLOF: Independent   PATIENT GOALS: Pain relief   NEXT MD VISIT:  To return if needed   OBJECTIVE: (objective measures completed at initial evaluation unless otherwise dated)   DIAGNOSTIC FINDINGS:  05/24/22 X-rays demonstrate moderate degenerative changes C5-6 C6-7 with  straightening of the cervical spine   PATIENT SURVEYS:  NDI 17/50= 34%  Moderate disability   COGNITION: Overall cognitive status: Within functional limits for tasks assessed   SENSATION: WFL   POSTURE: rounded shoulders, forward head, and increased thoracic kyphosis, and CT step off   PALPATION: TTP peri-CT junction and paraspinal and upper trap areas        CERVICAL ROM:    Active ROM A/PROM (deg) eval   Flexion 43 no P   Extension 44 P ant   Right lateral flexion 45 pull L 50  Left lateral flexion 40 pull R 50  Right rotation 60 no P 70  Left rotation 50 np P 65  P=provocation  (Blank rows = not tested)   UPPER EXTREMITY ROM: WNLs and equal Active ROM Right eval Left eval  Shoulder flexion      Shoulder extension      Shoulder abduction      Shoulder adduction      Shoulder extension      Shoulder internal rotation      Shoulder external rotation      Elbow flexion      Elbow extension      Wrist flexion      Wrist extension      Wrist ulnar deviation      Wrist radial deviation      Wrist pronation      Wrist supination       (Blank rows = not tested)   UPPER EXTREMITY MMT: Myotome screen negative MMT Right eval Left eval  Shoulder flexion      Shoulder extension      Shoulder abduction      Shoulder adduction      Shoulder extension      Shoulder internal rotation      Shoulder external rotation      Middle trapezius      Lower trapezius      Elbow flexion      Elbow extension      Wrist flexion      Wrist extension      Wrist ulnar deviation      Wrist radial  deviation      Wrist pronation      Wrist supination      Grip strength 64,40 19, 19 capal tunnel sugery 11/*/23   (Blank rows = not tested)   CERVICAL SPECIAL TESTS:  Neck flexor muscle endurance test: Positive, Spurling's test: Negative, and Distraction test: Negative DNF=11"   FUNCTIONAL TESTS:  NT  TODAY'S TREATMENT: [facility] Adult PT Treatment:                                                DATE: 08/16/22 Therapeutic Exercise: Supine Cervical Retraction 5 reps - 5 hold Supine DNF Liftoffs 5 reps  5 hold Standing Cervical Retraction  5 reps 5 hold Seated Cervical Sidebending Stretch  3 reps - 15 hold each Standing Cervical Retraction with Side bending 3 reps - 15 hold eac Manual Therapy: STM to the cervical paraspinals and upper traps Suboccipital release Cervical traction Skilled palpation to identify TrPs and taut muscle bands Trigger Point Dry Needling Treatment: Pre-treatment instruction: Patient instructed on dry needling rationale, procedures, and possible side effects including pain during treatment (achy,cramping feeling), bruising, drop of blood, lightheadedness, nausea, sweating. Patient Consent Given: Yes Education handout provided: Yes Muscles treated: L and R upper traps  and lower cervical paraspinals Needle size and number: .30x8m x 4 Electrical stimulation performed: No Parameters: N/A Treatment response/outcome: Twitch response elicited Post-treatment instructions: Patient instructed to expect possible mild to moderate muscle soreness later today and/or tomorrow. Patient instructed in methods to reduce muscle soreness and to continue prescribed HEP. If patient was dry needled over the lung field, patient was instructed on signs and symptoms of pneumothorax and, however unlikely, to see immediate medical attention should they occur. Patient was also educated on signs and symptoms of infection and to seek medical attention should they occur. Patient verbalized  understanding of these instructions and education.    [facility]                                                [facility]: [facility] Therapeutic Exercise: Supine Cervical Retraction 5 reps - 5 hold Supine DNF Liftoffs 5 reps  5 hold Standing Cervical Retraction  5 reps 5 hold Seated Cervical Sidebending Stretch  3 reps - 15 hold each Standing Cervical Retraction with Side bending 3 reps - 15 hold each Updated HEP Manual Therapy: STM to the cervical paraspinals and upper traps Suboccipital release Cervical traction Skilled palpation to identify TrPs and taut muscle bands Trigger Point Dry Needling Treatment: Pre-treatment instruction: Patient instructed on dry needling rationale, procedures, and possible side effects including pain during treatment (achy,cramping feeling), bruising, drop of blood, lightheadedness, nausea, sweating. Patient Consent Given: Yes Education handout provided: Yes Muscles treated: L and R suboccipitals  and upper cervical paraspinals Needle size and number: .30x567mx 2 .30x5053m 2 Electrical stimulation performed: No Parameters: N/A Treatment response/outcome: Twitch response elicited Post-treatment instructions: Patient instructed to expect possible mild to moderate muscle soreness later today and/or tomorrow. Patient instructed in methods to reduce muscle soreness and to continue prescribed HEP. If patient was dry needled over the lung field, patient was instructed on signs and symptoms of pneumothorax and, however unlikely, to see immediate medical attention should they occur. Patient was also educated on signs and symptoms of infection and to seek medical attention should they occur. Patient verbalized understanding of these instructions and education.  [facility] Adult PT Treatment:                                                DATE:  08/04/22 Therapeutic Exercise: Developed, instructed in, and pt completed therex as noted in HEP  Self Care: Sleeping positions and support for more restful sleep     PATIENT EDUCATION:  Education details: Eval findings, POC, HEP, self care  Person educated: Patient Education method: Explanation, Demonstration, Tactile cues, Verbal cues, and Handouts Education comprehension: verbalized understanding, returned demonstration, verbal cues required, and tactile cues required   HOME EXERCISE PROGRAM: Access Code: [email or web address] URL: [email or web address] Date: 08/11/2022 Prepared by: Gar Ponto  Exercises - Standing Cervical Retraction  - 6 x daily - 7 x weekly - 3 sets - 3-5 reps - 5 hold - Supine Cervical Retraction with Towel  - 2 x daily - 7 x weekly - 1 sets - 5 reps - 5 hold - Supine DNF Liftoffs  - 2 x daily - 7 x weekly - 1 sets - 5 reps - 5 hold - Seated Cervical Sidebending Stretch  - 6 x daily - 7 x weekly - 1 sets - 3-5 reps - 15 hold - Standing Cervical Retraction with Sidebending  - 6 x daily - 7 x weekly - 1 sets - 3-5 reps - 15 hold   ASSESSMENT:   CLINICAL IMPRESSION: PT was completed foe manual therapy as noted above f/b TPDN to the bilat upper traps and lower cervical paraspinals. Twitch responses were elicited. Pt then completed therex for postural and posterior chain strengthening and cervical ROM/flexibility. Pt tolerated PT today without adverse effects. Pt's cervical AROMs were reassessed and found improved meeting this LTG. Pt appears to be responding to her initial current course of care.  OBJECTIVE IMPAIRMENTS: decreased ROM, decreased strength, increased fascial restrictions, increased muscle spasms, postural dysfunction, and obesity.   ACTIVITY LIMITATIONS: carrying, lifting, reach over head, and caring for others   PARTICIPATION LIMITATIONS: meal prep, cleaning, laundry, driving, and occupation   PERSONAL FACTORS: Past/current experiences,  Profession, Time since onset of injury/illness/exacerbation, and 1 comorbidity: carpal tunnel releases  are also affecting patient's functional outcome.    REHAB POTENTIAL: Good   CLINICAL DECISION MAKING: Evolving/moderate complexity   EVALUATION COMPLEXITY: Moderate     GOALS:   SHORT TERM GOALS = LTGs   LONG TERM GOALS: Target date: 09/22/22   Pt will be Ind in a final HEP to maintain achieved LOF Baseline: initiated Goal status: INITIAL   2.  Increase cervical side bending by 5d and rotation by 10d for improved neck function c less pain Baseline: see flow sheets Status: 08/16/22= see flow sheets Goal status: MET   3.  Increase DNF endurance to 30" for improved neck function c less pain Baseline: 11" Goal status: INITIAL   4.  Pt will report improve quality of with pt able to sleep comfortably for 6 hours Baseline: 2 hours Goal status: INITIAL   5.  Pt will report a decrease in neck pain to 4/10 or less with daily activities  Baseline: 3-9/10 Goal status: INITIAL   6.  Pt's NDI score will improve to 12/50, 24% Min disability or less as indication of improved function Baseline: 17/50 35% Mod disability Goal status: INITIAL     PLAN:   PT FREQUENCY: 2x/week   PT DURATION: 6 weeks  PLANNED INTERVENTIONS: Therapeutic exercises, Therapeutic activity, Patient/Family education, Self Care, Aquatic Therapy, Dry Needling, Electrical stimulation, Spinal manipulation, Spinal mobilization, Cryotherapy, Moist heat, Taping, Vasopneumatic device, Traction, Ultrasound, Ionotophoresis '4mg'$ /ml Dexamethasone, Manual therapy, and Re-evaluation   PLAN FOR NEXT SESSION: Assess response to HEP; progress therex as indicated; use of modalities, manual therapy; and TPDN as indicated. Computer station set up. Tennis balls for massage at home.   Jennaya Pogue MS, PT 08/16/22 6:19 PM

## 2022-08-18 ENCOUNTER — Ambulatory Visit: Payer: Medicaid Other

## 2022-08-18 DIAGNOSIS — R252 Cramp and spasm: Secondary | ICD-10-CM

## 2022-08-18 DIAGNOSIS — M542 Cervicalgia: Secondary | ICD-10-CM

## 2022-08-18 DIAGNOSIS — R293 Abnormal posture: Secondary | ICD-10-CM

## 2022-08-18 NOTE — Therapy (Signed)
OUTPATIENT PHYSICAL THERAPY TREATMENT NOTE   Patient Name: [name] MRN: [id number] DOB:[birth year], 47 y.o., female Today's Date: 08/18/2022  PCP: Minette Brine, FNP   REFERRING PROVIDER: Aundra Dubin, PA-C   END OF SESSION:   PT End of Session - 08/18/22 0852     Visit Number 4    Number of Visits 13    Date for PT Re-Evaluation 09/22/22    Authorization Type [place] MEDICAID [facility]    Authorization Time Period 14 visits    PT Start Time 0848    PT Stop Time 0930    PT Time Calculation (min) 42 min    Activity Tolerance Patient tolerated treatment well    Behavior During Therapy WFL for tasks assessed/performed              Past Medical History:  Diagnosis Date   Anxiety    Cancer ([place])    ovarian cancer   Carpal tunnel syndrome of right wrist 11/2013   Depression    Ganglion cyst of wrist 11/2013   right   Hypertension    is on 3 meds. - states BP is under control; has been on med. x 12 yr.   Migraines    Right tennis elbow 11/2013   Past Surgical History:  Procedure Laterality Date   ABDOMINAL HYSTERECTOMY     complete   CARPAL TUNNEL RELEASE Right 12/12/2013   Procedure: ENDOSCOPY RIGHT WRIST CARPAL TUNNEL RELEASE EXCISION GANGLION WRIST PRIMARY ;  Surgeon: Renette Butters, MD;  Location: [place];  Service: Orthopedics;  Laterality: Right;   CARPAL TUNNEL RELEASE Left 05/31/2022   Procedure: LEFT CARPAL TUNNEL RELEASE;  Surgeon: Leandrew Koyanagi, MD;  Location: [place];  Service: Orthopedics;  Laterality: Left;   CESAREAN SECTION  03/11/2005; 04/05/2008   CHOLECYSTECTOMY  02/11/2004   lap. chole.   LATERAL EPICONDYLE RELEASE Right 12/12/2013   Procedure: RIGHT TENNIS ELBOW RELEASE WITH BONE REPAIR;  Surgeon: Renette Butters, MD;  Location: [place];  Service: Orthopedics;  Laterality: Right;   LUMBAR LAMINECTOMY WITH COFLEX 1 LEVEL Bilateral 01/14/2015   Procedure: Laminectomy - bilateral - L4-L5 ;   Surgeon: Karie Chimera, MD;  Location: [place] [facility];  Service: Neurosurgery;  Laterality: Bilateral;  Laminectomy - bilateral - L4-L5    LUMBAR WOUND DEBRIDEMENT N/A 02/02/2015   Procedure: LUMBAR WOUND DEBRIDEMENT, WASHOUT, CLOSURE;  Surgeon: Consuella Lose, MD;  Location: [place] [facility];  Service: Neurosurgery;  Laterality: N/A;  lumbar wound debridement   TUBAL LIGATION  04/05/2008   Patient Active Problem List   Diagnosis Date Noted   Current mild episode of major depressive disorder, unspecified whether recurrent ([place]) 01/30/2022   Carpal tunnel syndrome on left 11/28/2021   Numbness and tingling in left hand 10/11/2021   Abdominal cramping 09/26/2021   GAD (generalized anxiety disorder) 08/30/2017   Migraine without aura 08/10/2015   Spinal stenosis, lumbar 01/14/2015   Carpal tunnel syndrome 12/12/2013   HYPERLIPIDEMIA 12/05/2007   HYPERTENSION 12/05/2007   BRADYCARDIA 12/05/2007   DEGENERATIVE JOINT DISEASE 12/05/2007    REFERRING DIAG: Cervicalgia   THERAPY DIAG:  Cervicalgia  Cramp and spasm  Abnormal posture  Rationale for Evaluation and Treatment Rehabilitation   ONSET DATE: Approx 1 year   SUBJECTIVE:  SUBJECTIVE STATEMENT: Pt reports sleeping through the night last night without pain.   PAIN:  Are you having pain?  Yes: NPRS scale: 3/10 Pain location: CT junction Pain description: Burning Aggravating factors: Sleeping, hurts all the time Relieving factors: Stretching my neck back At night 8-9/10  PERTINENT HISTORY:  HTN; carpal tunnel release R 11/25/13, L 05/31/22; Lumbar laminectomy 01/14/15    PRECAUTIONS: None   WEIGHT BEARING RESTRICTIONS: No   FALLS:  Has patient fallen in last 6 months? No   LIVING ENVIRONMENT: Lives with: lives with their  family Lives in: House/apartment Pt reports she is able to access and be mobilie within her home   OCCUPATION: Billing-computer work   PLOF: Independent   PATIENT GOALS: Pain relief   NEXT MD VISIT:  To return if needed   OBJECTIVE: (objective measures completed at initial evaluation unless otherwise dated)   DIAGNOSTIC FINDINGS:  05/24/22 X-rays demonstrate moderate degenerative changes C5-6 C6-7 with  straightening of the cervical spine   PATIENT SURVEYS:  NDI 17/50= 34%  Moderate disability   COGNITION: Overall cognitive status: Within functional limits for tasks assessed   SENSATION: WFL   POSTURE: rounded shoulders, forward head, and increased thoracic kyphosis, and CT step off   PALPATION: TTP peri-CT junction and paraspinal and upper trap areas        CERVICAL ROM:    Active ROM A/PROM (deg) eval AROM 08/16/22  Flexion 43 no P   Extension 44 P ant   Right lateral flexion 45 pull L 50  Left lateral flexion 40 pull R 50  Right rotation 60 no P 70  Left rotation 50 np P 65  P=provocation  (Blank rows = not tested)   UPPER EXTREMITY ROM: WNLs and equal Active ROM Right eval Left eval  Shoulder flexion      Shoulder extension      Shoulder abduction      Shoulder adduction      Shoulder extension      Shoulder internal rotation      Shoulder external rotation      Elbow flexion      Elbow extension      Wrist flexion      Wrist extension      Wrist ulnar deviation      Wrist radial deviation      Wrist pronation      Wrist supination       (Blank rows = not tested)   UPPER EXTREMITY MMT: Myotome screen negative MMT Right eval Left eval  Shoulder flexion      Shoulder extension      Shoulder abduction      Shoulder adduction      Shoulder extension      Shoulder internal rotation      Shoulder external rotation      Middle trapezius      Lower trapezius      Elbow flexion      Elbow extension      Wrist flexion      Wrist extension       Wrist ulnar deviation      Wrist radial deviation      Wrist pronation      Wrist supination      Grip strength 64,40 19, 19 capal tunnel sugery 11/*/23   (Blank rows = not tested)   CERVICAL SPECIAL TESTS:  Neck flexor muscle endurance test: Positive, Spurling's test: Negative, and Distraction test: Negative DNF=11"   FUNCTIONAL  TESTS:  NT   TODAY'S TREATMENT: [facility] Adult PT Treatment:                                                DATE: 08/18/22 Therapeutic Exercise: Supine Cervical Retraction 5 reps - 5 hold Supine DNF Liftoffs 5 reps  5 hold Supine shoulder hor star pattern 2x5 GTB Supine shoulder ER 2x10 GTB Standing Cervical Retraction  5 reps 5 hold Seated Cervical Sidebending Stretch  2 reps - 15 hold each Seated Cervical Rotation 2 reps - 15 hold each Seated Cervical Rotation with Side bending 2 reps - 15 hold each Manual Therapy: STM to the cervical paraspinals and upper traps with MTPR Suboccipital release Self Care: Tennis balls for cervical and suboccipital massage Tennis ball for upper shoulder and mid back massage  [facility] Adult PT Treatment:                                                DATE: 08/16/22 Therapeutic Exercise: Supine Cervical Retraction 5 reps - 5 hold Supine DNF Liftoffs 5 reps  8 hold Standing Cervical Retraction  5 reps 5 hold standing against wall Seated Cervical Sidebending Stretch  3 reps - 15 hold each Standing Cervical Retraction with Side bending 3 reps - 15 hold eac Manual Therapy: STM to the cervical paraspinals and upper traps Suboccipital release Cervical traction Skilled palpation to identify TrPs and taut muscle bands Trigger Point Dry Needling Treatment: Pre-treatment instruction: Patient instructed on dry needling rationale, procedures, and possible side effects including pain during treatment (achy,cramping feeling), bruising, drop of blood, lightheadedness, nausea, sweating. Patient Consent Given: Yes Education handout  provided: Yes Muscles treated: L and R upper traps  and lower cervical paraspinals Needle size and number: .30x17m x 4 Electrical stimulation performed: No Parameters: N/A Treatment response/outcome: Twitch response elicited Post-treatment instructions: Patient instructed to expect possible mild to moderate muscle soreness later today and/or tomorrow. Patient instructed in methods to reduce muscle soreness and to continue prescribed HEP. If patient was dry needled over the lung field, patient was instructed on signs and symptoms of pneumothorax and, however unlikely, to see immediate medical attention should they occur. Patient was also educated on signs and symptoms of infection and to seek medical attention should they occur. Patient verbalized understanding of these instructions and education.    [facility]                                                [facility]: [facility] Therapeutic Exercise: Supine Cervical Retraction 5 reps - 5 hold Supine DNF Liftoffs 5 reps  5 hold Standing Cervical Retraction  5 reps 5 hold Seated Cervical Sidebending Stretch  3 reps - 15 hold each Standing Cervical Retraction with Side bending 3 reps - 15 hold each Updated HEP Manual Therapy: STM to the cervical paraspinals and upper traps Suboccipital release Cervical traction Skilled palpation to identify TrPs and taut muscle bands Trigger Point Dry Needling Treatment: Pre-treatment instruction: Patient instructed on dry needling rationale, procedures, and possible side effects including pain during treatment (achy,cramping feeling), bruising,  drop of blood, lightheadedness, nausea, sweating. Patient Consent Given: Yes Education handout provided: Yes Muscles treated: L and R suboccipitals  and upper cervical paraspinals Needle size and number: .30x49m x 2 .30x58mx 2 Electrical stimulation performed: No Parameters: N/A Treatment response/outcome: Twitch response elicited Post-treatment  instructions: Patient instructed to expect possible mild to moderate muscle soreness later today and/or tomorrow. Patient instructed in methods to reduce muscle soreness and to continue prescribed HEP. If patient was dry needled over the lung field, patient was instructed on signs and symptoms of pneumothorax and, however unlikely, to see immediate medical attention should they occur. Patient was also educated on signs and symptoms of infection and to seek medical attention should they occur. Patient verbalized understanding of these instructions and education.    PATIENT EDUCATION:  Education details: Eval findings, POC, HEP, self care  Person educated: Patient Education method: Explanation, Demonstration, Tactile cues, Verbal cues, and Handouts Education comprehension: verbalized understanding, returned demonstration, verbal cues required, and tactile cues required   HOME EXERCISE PROGRAM: Access Code: [email or web address] URL: [email or web address] Date: 08/18/2022 Prepared by: AlGar PontoExercises - Standing Cervical Retraction  - 6 x daily - 7 x weekly - 3 sets - 3-5 reps - 5 hold - Supine Cervical Retraction with Towel  - 2 x daily - 7 x weekly - 1 sets - 5 reps - 5 hold - Supine DNF Liftoffs  - 2 x daily - 7 x weekly - 1 sets - 5 reps - 5 hold - Shoulder External Rotation and Scapular Retraction with Resistance  - 1 x daily - 7 x weekly - 2 sets - 10 reps - 3 hold - Standing Shoulder Horizontal Abduction with Resistance  - 1 x daily - 7 x weekly - 2 sets - 10 reps - 3 hold - Seated Cervical Sidebending Stretch  - 3 x daily - 7 x weekly - 1 sets - 3-5 reps - 15 hold - Standing Cervical Retraction with Sidebending  - 3 x daily - 7 x weekly - 1 sets - 3-5 reps - 15 hold - Standing Cervical Rotation AROM with Overpressure  - 3 x daily - 7 x weekly - 1 sets - 10 reps - 15 hold - Seated Cervical Retraction and Rotation  - 3 x daily - 7 x weekly - 1 sets - 10 reps - 15 hold - Doorway Pec  Stretch at 90 Degrees Abduction  - 1 x daily - 7 x weekly - 1 sets - 10 reps - 30 hold   ASSESSMENT:  CLINICAL IMPRESSION: PT was completed for manual therapy as noted above. Pt was instructed in the use of tennis balls for cervical, suboccipital, upper shoulder, and mid back massage. Pt returned proper demonstration. Pt then participated in cervical mobility and postural/posterior chain strengthening. Pt tolerated PT today without adverse effects. Pt is responding well it current course of PT with report of decreased pai and improved sleep. Pt will continue to benefit from skilled PT to address impairments for improved neck function with less pain.  OBJECTIVE IMPAIRMENTS: decreased ROM, decreased strength, increased fascial restrictions, increased muscle spasms, postural dysfunction, and obesity.   ACTIVITY LIMITATIONS: carrying, lifting, reach over head, and caring for others   PARTICIPATION LIMITATIONS: meal prep, cleaning, laundry, driving, and occupation   PERSONAL FACTORS: Past/current experiences, Profession, Time since onset of injury/illness/exacerbation, and 1 comorbidity: carpal tunnel releases  are also affecting patient's functional outcome.    REHAB POTENTIAL: Good   CLINICAL DECISION MAKING: Evolving/moderate  complexity   EVALUATION COMPLEXITY: Moderate     GOALS:   SHORT TERM GOALS = LTGs   LONG TERM GOALS: Target date: 09/22/22   Pt will be Ind in a final HEP to maintain achieved LOF Baseline: initiated Goal status: INITIAL   2.  Increase cervical side bending by 5d and rotation by 10d for improved neck function c less pain Baseline: see flow sheets Status: 08/16/22= see flow sheets Goal status: MET   3.  Increase DNF endurance to 30" for improved neck function c less pain Baseline: 11" Goal status: INITIAL   4.  Pt will report improve quality of with pt able to sleep comfortably for 6 hours Baseline: 2 hours Goal status: INITIAL   5.  Pt will report a  decrease in neck pain to 4/10 or less with daily activities  Baseline: 3-9/10 Goal status: INITIAL   6.  Pt's NDI score will improve to 12/50, 24% Min disability or less as indication of improved function Baseline: 17/50 35% Mod disability Goal status: INITIAL     PLAN:   PT FREQUENCY: 2x/week   PT DURATION: 6 weeks   PLANNED INTERVENTIONS: Therapeutic exercises, Therapeutic activity, Patient/Family education, Self Care, Aquatic Therapy, Dry Needling, Electrical stimulation, Spinal manipulation, Spinal mobilization, Cryotherapy, Moist heat, Taping, Vasopneumatic device, Traction, Ultrasound, Ionotophoresis '4mg'$ /ml Dexamethasone, Manual therapy, and Re-evaluation   PLAN FOR NEXT SESSION: Assess response to HEP; progress therex as indicated; use of modalities, manual therapy; and TPDN as indicated. Review computer station set up for work posture.  Nikia Levels MS, PT 08/18/22 2:40 PM

## 2022-08-21 ENCOUNTER — Ambulatory Visit: Payer: Medicaid Other | Admitting: Physical Therapy

## 2022-08-21 ENCOUNTER — Encounter: Payer: Self-pay | Admitting: Physical Therapy

## 2022-08-21 DIAGNOSIS — M542 Cervicalgia: Secondary | ICD-10-CM | POA: Diagnosis not present

## 2022-08-21 DIAGNOSIS — R252 Cramp and spasm: Secondary | ICD-10-CM

## 2022-08-21 DIAGNOSIS — R293 Abnormal posture: Secondary | ICD-10-CM

## 2022-08-21 NOTE — Therapy (Signed)
OUTPATIENT PHYSICAL THERAPY TREATMENT NOTE   Patient Name: [name] MRN: [id number] DOB:08/14/1975, 47 y.o., female Today's Date: 08/21/2022  PCP: Minette Brine, FNP   REFERRING PROVIDER: Aundra Dubin, PA-C   END OF SESSION:   PT End of Session - 08/21/22 1414     Visit Number 5    Number of Visits 13    Date for PT Re-Evaluation 09/22/22    Authorization Type [place] MEDICAID [facility]    PT Start Time 0213    PT Stop Time 0242    PT Time Calculation (min) 29 min              Past Medical History:  Diagnosis Date   Anxiety    Cancer ([place])    ovarian cancer   Carpal tunnel syndrome of right wrist 11/2013   Depression    Ganglion cyst of wrist 11/2013   right   Hypertension    is on 3 meds. - states BP is under control; has been on med. x 12 yr.   Migraines    Right tennis elbow 11/2013   Past Surgical History:  Procedure Laterality Date   ABDOMINAL HYSTERECTOMY     complete   CARPAL TUNNEL RELEASE Right 12/12/2013   Procedure: ENDOSCOPY RIGHT WRIST CARPAL TUNNEL RELEASE EXCISION GANGLION WRIST PRIMARY ;  Surgeon: Renette Butters, MD;  Location: [place];  Service: Orthopedics;  Laterality: Right;   CARPAL TUNNEL RELEASE Left 05/31/2022   Procedure: LEFT CARPAL TUNNEL RELEASE;  Surgeon: Leandrew Koyanagi, MD;  Location: [place];  Service: Orthopedics;  Laterality: Left;   CESAREAN SECTION  03/11/2005; 04/05/2008   CHOLECYSTECTOMY  02/11/2004   lap. chole.   LATERAL EPICONDYLE RELEASE Right 12/12/2013   Procedure: RIGHT TENNIS ELBOW RELEASE WITH BONE REPAIR;  Surgeon: Renette Butters, MD;  Location: [place];  Service: Orthopedics;  Laterality: Right;   LUMBAR LAMINECTOMY WITH COFLEX 1 LEVEL Bilateral 01/14/2015   Procedure: Laminectomy - bilateral - L4-L5 ;  Surgeon: Karie Chimera, MD;  Location: [place] [facility];  Service: Neurosurgery;  Laterality: Bilateral;  Laminectomy - bilateral - L4-L5    LUMBAR WOUND  DEBRIDEMENT N/A 02/02/2015   Procedure: LUMBAR WOUND DEBRIDEMENT, WASHOUT, CLOSURE;  Surgeon: Consuella Lose, MD;  Location: [place] [facility];  Service: Neurosurgery;  Laterality: N/A;  lumbar wound debridement   TUBAL LIGATION  04/05/2008   Patient Active Problem List   Diagnosis Date Noted   Current mild episode of major depressive disorder, unspecified whether recurrent ([place]) 01/30/2022   Carpal tunnel syndrome on left 11/28/2021   Numbness and tingling in left hand 10/11/2021   Abdominal cramping 09/26/2021   GAD (generalized anxiety disorder) 08/30/2017   Migraine without aura 08/10/2015   Spinal stenosis, lumbar 01/14/2015   Carpal tunnel syndrome 12/12/2013   HYPERLIPIDEMIA 12/05/2007   HYPERTENSION 12/05/2007   BRADYCARDIA 12/05/2007   DEGENERATIVE JOINT DISEASE 12/05/2007    REFERRING DIAG: Cervicalgia   THERAPY DIAG:  Cervicalgia  Cramp and spasm  Abnormal posture  Rationale for Evaluation and Treatment Rehabilitation   ONSET DATE: Approx 1 year   SUBJECTIVE:  SUBJECTIVE STATEMENT: Pt reports sleeping through the night last night without pain.   PAIN:  Are you having pain?  Yes: NPRS scale: 6/10 Pain location: suboccipitals (dull constant , CT junction (burns/shock) Pain description: dull/  Burning Aggravating factors: Sleeping, hurts all the time Relieving factors: Stretching my neck back At night 8-9/10  PERTINENT HISTORY:  HTN; carpal tunnel release R 11/25/13, L 05/31/22; Lumbar laminectomy 01/14/15    PRECAUTIONS: None   WEIGHT BEARING RESTRICTIONS: No   FALLS:  Has patient fallen in last 6 months? No   LIVING ENVIRONMENT: Lives with: lives with their family Lives in: House/apartment Pt reports she is able to access and be mobilie within her home    OCCUPATION: Billing-computer work   PLOF: Independent   PATIENT GOALS: Pain relief   NEXT MD VISIT:  To return if needed   OBJECTIVE: (objective measures completed at initial evaluation unless otherwise dated)   DIAGNOSTIC FINDINGS:  05/24/22 X-rays demonstrate moderate degenerative changes C5-6 C6-7 with  straightening of the cervical spine   PATIENT SURVEYS:  NDI 17/50= 34%  Moderate disability   COGNITION: Overall cognitive status: Within functional limits for tasks assessed   SENSATION: WFL   POSTURE: rounded shoulders, forward head, and increased thoracic kyphosis, and CT step off   PALPATION: TTP peri-CT junction and paraspinal and upper trap areas        CERVICAL ROM:    Active ROM A/PROM (deg) eval AROM 08/16/22  Flexion 43 no P   Extension 44 P ant   Right lateral flexion 45 pull L 50  Left lateral flexion 40 pull R 50  Right rotation 60 no P 70  Left rotation 50 np P 65  P=provocation  (Blank rows = not tested)   UPPER EXTREMITY ROM: WNLs and equal Active ROM Right eval Left eval  Shoulder flexion      Shoulder extension      Shoulder abduction      Shoulder adduction      Shoulder extension      Shoulder internal rotation      Shoulder external rotation      Elbow flexion      Elbow extension      Wrist flexion      Wrist extension      Wrist ulnar deviation      Wrist radial deviation      Wrist pronation      Wrist supination       (Blank rows = not tested)   UPPER EXTREMITY MMT: Myotome screen negative MMT Right eval Left eval  Shoulder flexion      Shoulder extension      Shoulder abduction      Shoulder adduction      Shoulder extension      Shoulder internal rotation      Shoulder external rotation      Middle trapezius      Lower trapezius      Elbow flexion      Elbow extension      Wrist flexion      Wrist extension      Wrist ulnar deviation      Wrist radial deviation      Wrist pronation      Wrist supination       Grip strength 64,40 19, 19 capal tunnel sugery 11/*/23   (Blank rows = not tested)   CERVICAL SPECIAL TESTS:  Neck flexor muscle endurance test: Positive, Spurling's test: Negative, and  Distraction test: Negative DNF=11"   FUNCTIONAL TESTS:  NT   TODAY'S TREATMENT: [facility] Adult PT Treatment:                                                DATE: 08/21/22 Therapeutic Exercise: Seated Green band retraction with bilat ER x 20+ issued band Seated horizontal abduction 10 x 2 +issued band  Supine DNF 5 sec x 10 Manual Therapy: STW to mid and upper traps , sub occipitals  Self Care: Theracane education for self TPR Computer station set up  [facility] Adult PT Treatment:                                                DATE: 08/18/22 Therapeutic Exercise: Supine Cervical Retraction 5 reps - 5 hold Supine DNF Liftoffs 5 reps  5 hold Supine shoulder hor star pattern 2x5 GTB Supine shoulder ER 2x10 GTB Standing Cervical Retraction  5 reps 5 hold Seated Cervical Sidebending Stretch  2 reps - 15 hold each Seated Cervical Rotation 2 reps - 15 hold each Seated Cervical Rotation with Side bending 2 reps - 15 hold each Manual Therapy: STM to the cervical paraspinals and upper traps with MTPR Suboccipital release Self Care: Tennis balls for cervical and suboccipital massage Tennis ball for upper shoulder and mid back massage  [facility] Adult PT Treatment:                                                DATE: 08/16/22 Therapeutic Exercise: Supine Cervical Retraction 5 reps - 5 hold Supine DNF Liftoffs 5 reps  8 hold Standing Cervical Retraction  5 reps 5 hold standing against wall Seated Cervical Sidebending Stretch  3 reps - 15 hold each Standing Cervical Retraction with Side bending 3 reps - 15 hold eac Manual Therapy: STM to the cervical paraspinals and upper traps Suboccipital release Cervical traction Skilled palpation to identify TrPs and taut muscle bands Trigger Point Dry Needling  Treatment: Pre-treatment instruction: Patient instructed on dry needling rationale, procedures, and possible side effects including pain during treatment (achy,cramping feeling), bruising, drop of blood, lightheadedness, nausea, sweating. Patient Consent Given: Yes Education handout provided: Yes Muscles treated: L and R upper traps  and lower cervical paraspinals Needle size and number: .30x51m x 4 Electrical stimulation performed: No Parameters: N/A Treatment response/outcome: Twitch response elicited Post-treatment instructions: Patient instructed to expect possible mild to moderate muscle soreness later today and/or tomorrow. Patient instructed in methods to reduce muscle soreness and to continue prescribed HEP. If patient was dry needled over the lung field, patient was instructed on signs and symptoms of pneumothorax and, however unlikely, to see immediate medical attention should they occur. Patient was also educated on signs and symptoms of infection and to seek medical attention should they occur. Patient verbalized understanding of these instructions and education.    [facility]  [facility] Therapeutic Exercise: Supine Cervical Retraction 5 reps - 5 hold Supine DNF Liftoffs 5 reps  5 hold Standing Cervical Retraction  5 reps 5 hold Seated Cervical Sidebending Stretch  3 reps - 15 hold each Standing Cervical Retraction with Side bending 3 reps - 15 hold each Updated HEP Manual Therapy: STM to the cervical paraspinals and upper traps Suboccipital release Cervical traction Skilled palpation to identify TrPs and taut muscle bands Trigger Point Dry Needling Treatment: Pre-treatment instruction: Patient instructed on dry needling rationale, procedures, and possible side effects including pain during treatment (achy,cramping feeling), bruising, drop of blood, lightheadedness, nausea, sweating. Patient Consent Given:  Yes Education handout provided: Yes Muscles treated: L and R suboccipitals  and upper cervical paraspinals Needle size and number: .30x3m x 2 .30x555mx 2 Electrical stimulation performed: No Parameters: N/A Treatment response/outcome: Twitch response elicited Post-treatment instructions: Patient instructed to expect possible mild to moderate muscle soreness later today and/or tomorrow. Patient instructed in methods to reduce muscle soreness and to continue prescribed HEP. If patient was dry needled over the lung field, patient was instructed on signs and symptoms of pneumothorax and, however unlikely, to see immediate medical attention should they occur. Patient was also educated on signs and symptoms of infection and to seek medical attention should they occur. Patient verbalized understanding of these instructions and education.    PATIENT EDUCATION:  Education details: Eval findings, POC, HEP, self care  Person educated: Patient Education method: Explanation, Demonstration, Tactile cues, Verbal cues, and Handouts Education comprehension: verbalized understanding, returned demonstration, verbal cues required, and tactile cues required   HOME EXERCISE PROGRAM: Access Code: [email or web address] URL: [email or web address] Date: 08/18/2022 Prepared by: AlGar PontoExercises - Standing Cervical Retraction  - 6 x daily - 7 x weekly - 3 sets - 3-5 reps - 5 hold - Supine Cervical Retraction with Towel  - 2 x daily - 7 x weekly - 1 sets - 5 reps - 5 hold - Supine DNF Liftoffs  - 2 x daily - 7 x weekly - 1 sets - 5 reps - 5 hold - Shoulder External Rotation and Scapular Retraction with Resistance  - 1 x daily - 7 x weekly - 2 sets - 10 reps - 3 hold - Standing Shoulder Horizontal Abduction with Resistance  - 1 x daily - 7 x weekly - 2 sets - 10 reps - 3 hold - Seated Cervical Sidebending Stretch  - 3 x daily - 7 x weekly - 1 sets - 3-5 reps - 15 hold - Standing Cervical Retraction with  Sidebending  - 3 x daily - 7 x weekly - 1 sets - 3-5 reps - 15 hold - Standing Cervical Rotation AROM with Overpressure  - 3 x daily - 7 x weekly - 1 sets - 10 reps - 15 hold - Seated Cervical Retraction and Rotation  - 3 x daily - 7 x weekly - 1 sets - 10 reps - 15 hold - Doorway Pec Stretch at 90 Degrees Abduction  - 1 x daily - 7 x weekly - 1 sets - 10 reps - 30 hold   ASSESSMENT:  CLINICAL IMPRESSION: PT was completed for manual therapy as noted above. Pt was instructed in the use of theracane for  upper shoulder, and mid back massage. Pt returned proper demonstration. Pt then participated in cervical mobility and postural/posterior chain strengthening. She was given therabands for recent HEP. Discussed computer set up and posture. Pt verbalized understanding. Pt tolerated PT today without  adverse effects. Pt is responding well it current course of PT with report of decreased pai and improved sleep. Pt will continue to benefit from skilled PT to address impairments for improved neck function with less pain.  OBJECTIVE IMPAIRMENTS: decreased ROM, decreased strength, increased fascial restrictions, increased muscle spasms, postural dysfunction, and obesity.   ACTIVITY LIMITATIONS: carrying, lifting, reach over head, and caring for others   PARTICIPATION LIMITATIONS: meal prep, cleaning, laundry, driving, and occupation   PERSONAL FACTORS: Past/current experiences, Profession, Time since onset of injury/illness/exacerbation, and 1 comorbidity: carpal tunnel releases  are also affecting patient's functional outcome.    REHAB POTENTIAL: Good   CLINICAL DECISION MAKING: Evolving/moderate complexity   EVALUATION COMPLEXITY: Moderate     GOALS:   SHORT TERM GOALS = LTGs   LONG TERM GOALS: Target date: 09/22/22   Pt will be Ind in a final HEP to maintain achieved LOF Baseline: initiated Goal status: INITIAL   2.  Increase cervical side bending by 5d and rotation by 10d for improved neck  function c less pain Baseline: see flow sheets Status: 08/16/22= see flow sheets Goal status: MET   3.  Increase DNF endurance to 30" for improved neck function c less pain Baseline: 11" Goal status: INITIAL   4.  Pt will report improve quality of with pt able to sleep comfortably for 6 hours Baseline: 2 hours Goal status: INITIAL   5.  Pt will report a decrease in neck pain to 4/10 or less with daily activities  Baseline: 3-9/10 Goal status: INITIAL   6.  Pt's NDI score will improve to 12/50, 24% Min disability or less as indication of improved function Baseline: 17/50 35% Mod disability Goal status: INITIAL     PLAN:   PT FREQUENCY: 2x/week   PT DURATION: 6 weeks   PLANNED INTERVENTIONS: Therapeutic exercises, Therapeutic activity, Patient/Family education, Self Care, Aquatic Therapy, Dry Needling, Electrical stimulation, Spinal manipulation, Spinal mobilization, Cryotherapy, Moist heat, Taping, Vasopneumatic device, Traction, Ultrasound, Ionotophoresis '4mg'$ /ml Dexamethasone, Manual therapy, and Re-evaluation   PLAN FOR NEXT SESSION: Assess response to HEP; progress therex as indicated; use of modalities, manual therapy; and TPDN as indicated. Review computer station set up for work posture.  Hessie Diener, PTA 08/21/22 2:46 PM Phone: [phone] Fax: [phone]

## 2022-08-24 NOTE — Therapy (Signed)
OUTPATIENT PHYSICAL THERAPY TREATMENT NOTE   Patient Name: [name] MRN: [id number] DOB:[birth year], 47 y.o., female Today's Date: 08/25/2022  PCP: Minette Brine, FNP   REFERRING PROVIDER: Aundra Dubin, PA-C   END OF SESSION:   PT End of Session - 08/25/22 1308     Visit Number 6    Number of Visits 13    Date for PT Re-Evaluation 09/22/22    Authorization Type [place] MEDICAID [facility]    Authorization Time Period 14 visits    Authorization - Visit Number 5    Authorization - Number of Visits 14    Progress Note Due on Visit 10    PT Start Time 0851    PT Stop Time 0934    PT Time Calculation (min) 43 min    Activity Tolerance Patient tolerated treatment well    Behavior During Therapy [facility] for tasks assessed/performed              Past Medical History:  Diagnosis Date   Anxiety    Cancer ([place])    ovarian cancer   Carpal tunnel syndrome of right wrist 11/2013   Depression    Ganglion cyst of wrist 11/2013   right   Hypertension    is on 3 meds. - states BP is under control; has been on med. x 12 yr.   Migraines    Right tennis elbow 11/2013   Past Surgical History:  Procedure Laterality Date   ABDOMINAL HYSTERECTOMY     complete   CARPAL TUNNEL RELEASE Right 12/12/2013   Procedure: ENDOSCOPY RIGHT WRIST CARPAL TUNNEL RELEASE EXCISION GANGLION WRIST PRIMARY ;  Surgeon: Renette Butters, MD;  Location: [place];  Service: Orthopedics;  Laterality: Right;   CARPAL TUNNEL RELEASE Left 05/31/2022   Procedure: LEFT CARPAL TUNNEL RELEASE;  Surgeon: Leandrew Koyanagi, MD;  Location: [place];  Service: Orthopedics;  Laterality: Left;   CESAREAN SECTION  03/11/2005; 04/05/2008   CHOLECYSTECTOMY  02/11/2004   lap. chole.   LATERAL EPICONDYLE RELEASE Right 12/12/2013   Procedure: RIGHT TENNIS ELBOW RELEASE WITH BONE REPAIR;  Surgeon: Renette Butters, MD;  Location: [place];  Service: Orthopedics;  Laterality: Right;    LUMBAR LAMINECTOMY WITH COFLEX 1 LEVEL Bilateral 01/14/2015   Procedure: Laminectomy - bilateral - L4-L5 ;  Surgeon: Karie Chimera, MD;  Location: [place] [facility];  Service: Neurosurgery;  Laterality: Bilateral;  Laminectomy - bilateral - L4-L5    LUMBAR WOUND DEBRIDEMENT N/A 02/02/2015   Procedure: LUMBAR WOUND DEBRIDEMENT, WASHOUT, CLOSURE;  Surgeon: Consuella Lose, MD;  Location: [place] [facility];  Service: Neurosurgery;  Laterality: N/A;  lumbar wound debridement   TUBAL LIGATION  04/05/2008   Patient Active Problem List   Diagnosis Date Noted   Current mild episode of major depressive disorder, unspecified whether recurrent ([place]) 01/30/2022   Carpal tunnel syndrome on left 11/28/2021   Numbness and tingling in left hand 10/11/2021   Abdominal cramping 09/26/2021   GAD (generalized anxiety disorder) 08/30/2017   Migraine without aura 08/10/2015   Spinal stenosis, lumbar 01/14/2015   Carpal tunnel syndrome 12/12/2013   HYPERLIPIDEMIA 12/05/2007   HYPERTENSION 12/05/2007   BRADYCARDIA 12/05/2007   DEGENERATIVE JOINT DISEASE 12/05/2007    REFERRING DIAG: Cervicalgia   THERAPY DIAG:  Cervicalgia  Cramp and spasm  Abnormal posture  Rationale for Evaluation and Treatment Rehabilitation   ONSET DATE: Approx 1 year   SUBJECTIVE:  SUBJECTIVE STATEMENT: Pt reports experiencing R upper trap pain with sleeping last night. Pt notes she has purchased a theracane.  Are you having pain?  Yes: NPRS scale: 6/10 Pain location: suboccipitals (dull constant , CT junction (burns/shock) Pain description: dull/  Burning Aggravating factors: Sleeping, hurts all the time Relieving factors: Stretching my neck back At night 8-9/10  PERTINENT HISTORY:  HTN; carpal tunnel release R 11/25/13, L 05/31/22;  Lumbar laminectomy 01/14/15    PRECAUTIONS: None   WEIGHT BEARING RESTRICTIONS: No   FALLS:  Has patient fallen in last 6 months? No   LIVING ENVIRONMENT: Lives with: lives with their family Lives in: House/apartment Pt reports she is able to access and be mobilie within her home   OCCUPATION: Billing-computer work   PLOF: Independent   PATIENT GOALS: Pain relief   NEXT MD VISIT:  To return if needed   OBJECTIVE: (objective measures completed at initial evaluation unless otherwise dated)   DIAGNOSTIC FINDINGS:  05/24/22 X-rays demonstrate moderate degenerative changes C5-6 C6-7 with  straightening of the cervical spine   PATIENT SURVEYS:  NDI 17/50= 34%  Moderate disability   COGNITION: Overall cognitive status: Within functional limits for tasks assessed   SENSATION: WFL   POSTURE: rounded shoulders, forward head, and increased thoracic kyphosis, and CT step off   PALPATION: TTP peri-CT junction and paraspinal and upper trap areas        CERVICAL ROM:    Active ROM A/PROM (deg) eval AROM 08/16/22  Flexion 43 no P   Extension 44 P ant   Right lateral flexion 45 pull L 50  Left lateral flexion 40 pull R 50  Right rotation 60 no P 70  Left rotation 50 np P 65  P=provocation  (Blank rows = not tested)   UPPER EXTREMITY ROM: WNLs and equal Active ROM Right eval Left eval  Shoulder flexion      Shoulder extension      Shoulder abduction      Shoulder adduction      Shoulder extension      Shoulder internal rotation      Shoulder external rotation      Elbow flexion      Elbow extension      Wrist flexion      Wrist extension      Wrist ulnar deviation      Wrist radial deviation      Wrist pronation      Wrist supination       (Blank rows = not tested)   UPPER EXTREMITY MMT: Myotome screen negative MMT Right eval Left eval  Shoulder flexion      Shoulder extension      Shoulder abduction      Shoulder adduction      Shoulder extension       Shoulder internal rotation      Shoulder external rotation      Middle trapezius      Lower trapezius      Elbow flexion      Elbow extension      Wrist flexion      Wrist extension      Wrist ulnar deviation      Wrist radial deviation      Wrist pronation      Wrist supination      Grip strength 64,40 19, 19 capal tunnel sugery 11/*/23   (Blank rows = not tested)   CERVICAL SPECIAL TESTS:  Neck flexor muscle endurance test:  Positive, Spurling's test: Negative, and Distraction test: Negative DNF=11"   FUNCTIONAL TESTS:  NT   TODAY'S TREATMENT: [facility] Adult PT Treatment:                                                DATE: 08/25/22 Therapeutic Exercise: Supine Cervical Retraction 5 reps - 5 hold Supine DNF Liftoffs 5 reps  8 hold Standing Cervical Retraction  5 reps 5 hold standing against wall Seated Cervical Sidebending Stretch  3 reps - 15 hold each Standing Cervical Retraction with Side bending 3 reps - 15 hold eac Cervical retraction at wall x5 5"- postural feedback Standing Green band retraction with bilat ER 2x 15 Standing horizontal abduction star 5 x 2 +issued band  Manual Therapy: STM to the cervical paraspinals and upper traps Suboccipital release Skilled palpation to identify TrPs and taut muscle bands Trigger Point Dry Needling Treatment: Pre-treatment instruction: Patient instructed on dry needling rationale, procedures, and possible side effects including pain during treatment (achy,cramping feeling), bruising, drop of blood, lightheadedness, nausea, sweating. Patient Consent Given: Yes Education handout provided: Yes Muscles treated: L and R upper traps  and L suboccipitals and upper cervical paraspinals Needle size and number: .30x67m x 1; .30x376mx 2 Electrical stimulation performed: No Parameters: N/A Treatment response/outcome: Twitch response elicited Post-treatment instructions: Patient instructed to expect possible mild to moderate muscle soreness  later today and/or tomorrow. Patient instructed in methods to reduce muscle soreness and to continue prescribed HEP. If patient was dry needled over the lung field, patient was instructed on signs and symptoms of pneumothorax and, however unlikely, to see immediate medical attention should they occur. Patient was also educated on signs and symptoms of infection and to seek medical attention should they occur. Patient verbalized understanding of these instructions and education.   [facility]                                                [facility]: [facility] Therapeutic Exercise: Seated Green band retraction with bilat ER x 20+ issued band Seated horizontal abduction 10 x 2 +issued band  Supine DNF 5 sec x 10 Manual Therapy: STW to mid and upper traps , sub occipitals  Self Care: Theracane education for self TPR Computer station set up  [facility]                                                DATE: 08[facility]22 Therapeutic Exercise: Supine Cervical Retraction 5 reps - 5 hold Supine DNF Liftoffs 5 reps  5 hold Supine shoulder hor star pattern 2x5 GTB Supine shoulder ER 2x10 GTB Standing Cervical Retraction  5 reps 5 hold Seated Cervical Sidebending Stretch  2 reps - 15 hold each Seated Cervical Rotation 2 reps - 15 hold each Seated Cervical Rotation with Side bending 2 reps - 15 hold each Manual Therapy: STM to the cervical paraspinals and upper traps with MTPR Suboccipital release Self Care: Tennis balls for cervical and suboccipital massage Tennis ball for upper shoulder and mid back massage   PATIENT EDUCATION:  Education  details: Eval findings, POC, HEP, self care  Person educated: Patient Education method: Explanation, Demonstration, Tactile cues, Verbal cues, and Handouts Education comprehension: verbalized understanding, returned demonstration, verbal cues required, and tactile cues required   HOME EXERCISE PROGRAM: Access Code: [email or web address] URL:  [email or web address] Date: 08/18/2022 Prepared by: Gar Ponto  Exercises - Standing Cervical Retraction  - 6 x daily - 7 x weekly - 3 sets - 3-5 reps - 5 hold - Supine Cervical Retraction with Towel  - 2 x daily - 7 x weekly - 1 sets - 5 reps - 5 hold - Supine DNF Liftoffs  - 2 x daily - 7 x weekly - 1 sets - 5 reps - 5 hold - Shoulder External Rotation and Scapular Retraction with Resistance  - 1 x daily - 7 x weekly - 2 sets - 10 reps - 3 hold - Standing Shoulder Horizontal Abduction with Resistance  - 1 x daily - 7 x weekly - 2 sets - 10 reps - 3 hold - Seated Cervical Sidebending Stretch  - 3 x daily - 7 x weekly - 1 sets - 3-5 reps - 15 hold - Standing Cervical Retraction with Sidebending  - 3 x daily - 7 x weekly - 1 sets - 3-5 reps - 15 hold - Standing Cervical Rotation AROM with Overpressure  - 3 x daily - 7 x weekly - 1 sets - 10 reps - 15 hold - Seated Cervical Retraction and Rotation  - 3 x daily - 7 x weekly - 1 sets - 10 reps - 15 hold - Doorway Pec Stretch at 90 Degrees Abduction  - 1 x daily - 7 x weekly - 1 sets - 10 reps - 30 hold   ASSESSMENT:  CLINICAL IMPRESSION: PT was completed for manual therapy as noted above f/b TPDN to the bilat upper traps and L suboccipitals and upper cervical paraspinals. Muscle twitches were elicited. Therex was then completed for postural and posterior chain strengthening. Will assess pt's complete response to today's PT session with her next scheduled visit. Pt tolerated PT today without adverse effects. Pt will continue to benefit from skilled PT to address impairments for improved neck function with less pain.   OBJECTIVE IMPAIRMENTS: decreased ROM, decreased strength, increased fascial restrictions, increased muscle spasms, postural dysfunction, and obesity.   ACTIVITY LIMITATIONS: carrying, lifting, reach over head, and caring for others   PARTICIPATION LIMITATIONS: meal prep, cleaning, laundry, driving, and occupation    PERSONAL FACTORS: Past/current experiences, Profession, Time since onset of injury/illness/exacerbation, and 1 comorbidity: carpal tunnel releases  are also affecting patient's functional outcome.    REHAB POTENTIAL: Good   CLINICAL DECISION MAKING: Evolving/moderate complexity   EVALUATION COMPLEXITY: Moderate     GOALS:   SHORT TERM GOALS = LTGs   LONG TERM GOALS: Target date: 09/22/22   Pt will be Ind in a final HEP to maintain achieved LOF Baseline: initiated Goal status: INITIAL   2.  Increase cervical side bending by 5d and rotation by 10d for improved neck function c less pain Baseline: see flow sheets Status: 08/16/22= see flow sheets Goal status: MET   3.  Increase DNF endurance to 30" for improved neck function c less pain Baseline: 11" Goal status: INITIAL   4.  Pt will report improve quality of with pt able to sleep comfortably for 6 hours Baseline: 2 hours Goal status: INITIAL   5.  Pt will report a decrease in neck pain to 4/10 or less with daily activities  Baseline: 3-9/10 Goal status: INITIAL   6.  Pt's NDI score will improve to 12/50, 24% Min disability or less as indication of improved function Baseline: 17/50 35% Mod disability Goal status: INITIAL     PLAN:   PT FREQUENCY: 2x/week   PT DURATION: 6 weeks   PLANNED INTERVENTIONS: Therapeutic exercises, Therapeutic activity, Patient/Family education, Self Care, Aquatic Therapy, Dry Needling, Electrical stimulation, Spinal manipulation, Spinal mobilization, Cryotherapy, Moist heat, Taping, Vasopneumatic device, Traction, Ultrasound, Ionotophoresis '4mg'$ /ml Dexamethasone, Manual therapy, and Re-evaluation   PLAN FOR NEXT SESSION: Assess response to HEP; progress therex as indicated; use of modalities, manual therapy; and TPDN as indicated. Trial of estim.  Murriel Eidem MS, PT 08/25/22 1:31 PM

## 2022-08-25 ENCOUNTER — Ambulatory Visit: Payer: Medicaid Other | Attending: Nurse Practitioner

## 2022-08-25 DIAGNOSIS — R252 Cramp and spasm: Secondary | ICD-10-CM | POA: Diagnosis present

## 2022-08-25 DIAGNOSIS — R293 Abnormal posture: Secondary | ICD-10-CM | POA: Insufficient documentation

## 2022-08-25 DIAGNOSIS — M542 Cervicalgia: Secondary | ICD-10-CM | POA: Insufficient documentation

## 2022-08-28 ENCOUNTER — Ambulatory Visit: Payer: Medicaid Other | Admitting: Physical Therapy

## 2022-08-29 ENCOUNTER — Telehealth: Payer: Self-pay

## 2022-08-29 ENCOUNTER — Ambulatory Visit: Payer: Medicaid Other

## 2022-08-29 NOTE — Telephone Encounter (Signed)
Spoke with pt via phone re: no show appt today. Pt stated she forgot. Pt was reminded of her up coming appt this Friday.

## 2022-08-29 NOTE — Therapy (Incomplete)
OUTPATIENT PHYSICAL THERAPY TREATMENT NOTE   Patient Name: [name] MRN: [id number] DOB:Oct 23, 1975, 47 y.o., female Today's Date: 08/29/2022  PCP: Minette Brine, FNP   REFERRING PROVIDER: Aundra Dubin, PA-C   END OF SESSION:      Past Medical History:  Diagnosis Date   Anxiety    Cancer [facility])    ovarian cancer   Carpal tunnel syndrome of right wrist 11/2013   Depression    Ganglion cyst of wrist 11/2013   right   Hypertension    is on 3 meds. - states BP is under control; has been on med. x 12 yr.   Migraines    Right tennis elbow 11/2013   Past Surgical History:  Procedure Laterality Date   ABDOMINAL HYSTERECTOMY     complete   CARPAL TUNNEL RELEASE Right 12/12/2013   Procedure: ENDOSCOPY RIGHT WRIST CARPAL TUNNEL RELEASE EXCISION GANGLION WRIST PRIMARY ;  Surgeon: Renette Butters, MD;  Location: [place];  Service: Orthopedics;  Laterality: Right;   CARPAL TUNNEL RELEASE Left 05/31/2022   Procedure: LEFT CARPAL TUNNEL RELEASE;  Surgeon: Leandrew Koyanagi, MD;  Location: [place];  Service: Orthopedics;  Laterality: Left;   CESAREAN SECTION  03/11/2005; 04/05/2008   CHOLECYSTECTOMY  02/11/2004   lap. chole.   LATERAL EPICONDYLE RELEASE Right 12/12/2013   Procedure: RIGHT TENNIS ELBOW RELEASE WITH BONE REPAIR;  Surgeon: Renette Butters, MD;  Location: [place];  Service: Orthopedics;  Laterality: Right;   LUMBAR LAMINECTOMY WITH COFLEX 1 LEVEL Bilateral 01/14/2015   Procedure: Laminectomy - bilateral - L4-L5 ;  Surgeon: Karie Chimera, MD;  Location: [place] [facility];  Service: Neurosurgery;  Laterality: Bilateral;  Laminectomy - bilateral - L4-L5    LUMBAR WOUND DEBRIDEMENT N/A 02/02/2015   Procedure: LUMBAR WOUND DEBRIDEMENT, WASHOUT, CLOSURE;  Surgeon: Consuella Lose, MD;  Location: [place] [facility];  Service: Neurosurgery;  Laterality: N/A;  lumbar wound debridement   TUBAL LIGATION  04/05/2008   Patient Active Problem  List   Diagnosis Date Noted   Current mild episode of major depressive disorder, unspecified whether recurrent ([place]) 01/30/2022   Carpal tunnel syndrome on left 11/28/2021   Numbness and tingling in left hand 10/11/2021   Abdominal cramping 09/26/2021   GAD (generalized anxiety disorder) 08/30/2017   Migraine without aura 08/10/2015   Spinal stenosis, lumbar 01/14/2015   Carpal tunnel syndrome 12/12/2013   HYPERLIPIDEMIA 12/05/2007   HYPERTENSION 12/05/2007   BRADYCARDIA 12/05/2007   DEGENERATIVE JOINT DISEASE 12/05/2007    REFERRING DIAG: Cervicalgia   THERAPY DIAG:  No diagnosis found.  Rationale for Evaluation and Treatment Rehabilitation   ONSET DATE: Approx 1 year   SUBJECTIVE:  SUBJECTIVE STATEMENT: Pt reports experiencing R upper trap pain with sleeping last night. Pt notes she has purchased a theracane.  Are you having pain?  Yes: NPRS scale: 6/10 Pain location: suboccipitals (dull constant , CT junction (burns/shock) Pain description: dull/  Burning Aggravating factors: Sleeping, hurts all the time Relieving factors: Stretching my neck back At night 8-9/10  PERTINENT HISTORY:  HTN; carpal tunnel release R 11/25/13, L 05/31/22; Lumbar laminectomy 01/14/15    PRECAUTIONS: None   WEIGHT BEARING RESTRICTIONS: No   FALLS:  Has patient fallen in last 6 months? No   LIVING ENVIRONMENT: Lives with: lives with their family Lives in: House/apartment Pt reports she is able to access and be mobilie within her home   OCCUPATION: Billing-computer work   PLOF: Independent   PATIENT GOALS: Pain relief   NEXT MD VISIT:  To return if needed   OBJECTIVE: (objective measures completed at initial evaluation unless otherwise dated)   DIAGNOSTIC FINDINGS:  05/24/22 X-rays  demonstrate moderate degenerative changes C5-6 C6-7 with  straightening of the cervical spine   PATIENT SURVEYS:  NDI 17/50= 34%  Moderate disability   COGNITION: Overall cognitive status: Within functional limits for tasks assessed   SENSATION: WFL   POSTURE: rounded shoulders, forward head, and increased thoracic kyphosis, and CT step off   PALPATION: TTP peri-CT junction and paraspinal and upper trap areas        CERVICAL ROM:    Active ROM A/PROM (deg) eval AROM 08/16/22  Flexion 43 no P   Extension 44 P ant   Right lateral flexion 45 pull L 50  Left lateral flexion 40 pull R 50  Right rotation 60 no P 70  Left rotation 50 np P 65  P=provocation  (Blank rows = not tested)   UPPER EXTREMITY ROM: WNLs and equal Active ROM Right eval Left eval  Shoulder flexion      Shoulder extension      Shoulder abduction      Shoulder adduction      Shoulder extension      Shoulder internal rotation      Shoulder external rotation      Elbow flexion      Elbow extension      Wrist flexion      Wrist extension      Wrist ulnar deviation      Wrist radial deviation      Wrist pronation      Wrist supination       (Blank rows = not tested)   UPPER EXTREMITY MMT: Myotome screen negative MMT Right eval Left eval  Shoulder flexion      Shoulder extension      Shoulder abduction      Shoulder adduction      Shoulder extension      Shoulder internal rotation      Shoulder external rotation      Middle trapezius      Lower trapezius      Elbow flexion      Elbow extension      Wrist flexion      Wrist extension      Wrist ulnar deviation      Wrist radial deviation      Wrist pronation      Wrist supination      Grip strength 64,40 19, 19 capal tunnel sugery 11/*/23   (Blank rows = not tested)   CERVICAL SPECIAL TESTS:  Neck flexor muscle endurance test: Positive,  Spurling's test: Negative, and Distraction test: Negative DNF=11"   FUNCTIONAL TESTS:  NT    TODAY'S TREATMENT: [facility] Adult PT Treatment:                                                DATE: 08/29/22 Therapeutic Exercise: *** Manual Therapy: *** Neuromuscular re-ed: *** Therapeutic Activity: *** Modalities: *** Self Care: ***  [facility] Adult PT Treatment:                                                DATE: 08/25/22 Therapeutic Exercise: Supine Cervical Retraction 5 reps - 5 hold Supine DNF Liftoffs 5 reps  8 hold Standing Cervical Retraction  5 reps 5 hold standing against wall Seated Cervical Sidebending Stretch  3 reps - 15 hold each Standing Cervical Retraction with Side bending 3 reps - 15 hold eac Cervical retraction at wall x5 5"- postural feedback Standing Green band retraction with bilat ER 2x 15 Standing horizontal abduction star 5 x 2 +issued band  Manual Therapy: STM to the cervical paraspinals and upper traps Suboccipital release Skilled palpation to identify TrPs and taut muscle bands Trigger Point Dry Needling Treatment: Pre-treatment instruction: Patient instructed on dry needling rationale, procedures, and possible side effects including pain during treatment (achy,cramping feeling), bruising, drop of blood, lightheadedness, nausea, sweating. Patient Consent Given: Yes Education handout provided: Yes Muscles treated: L and R upper traps  and L suboccipitals and upper cervical paraspinals Needle size and number: .30x10m x 1; .30x349mx 2 Electrical stimulation performed: No Parameters: N/A Treatment response/outcome: Twitch response elicited Post-treatment instructions: Patient instructed to expect possible mild to moderate muscle soreness later today and/or tomorrow. Patient instructed in methods to reduce muscle soreness and to continue prescribed HEP. If patient was dry needled over the lung field, patient was instructed on signs and symptoms of pneumothorax and, however unlikely, to see immediate medical attention should they occur. Patient was also  educated on signs and symptoms of infection and to seek medical attention should they occur. Patient verbalized understanding of these instructions and education.   [facility]                                                DATE: 08/21/22 Therapeutic Exercise: Seated Green band retraction with bilat ER x 20+ issued band Seated horizontal abduction 10 x 2 +issued band  Supine DNF 5 sec x 10 Manual Therapy: STW to mid and upper traps , sub occipitals  Self Care: Theracane education for self TPR Computer station set up  [facility]                                                DATE: 08/18/22 Therapeutic Exercise: Supine Cervical Retraction 5 reps - 5 hold Supine DNF Liftoffs 5 reps  5 hold Supine shoulder hor star pattern 2x5 GTB Supine shoulder ER 2x10 GTB Standing Cervical Retraction  5 reps 5  hold Seated Cervical Sidebending Stretch  2 reps - 15 hold each Seated Cervical Rotation 2 reps - 15 hold each Seated Cervical Rotation with Side bending 2 reps - 15 hold each Manual Therapy: STM to the cervical paraspinals and upper traps with MTPR Suboccipital release Self Care: Tennis balls for cervical and suboccipital massage Tennis ball for upper shoulder and mid back massage   PATIENT EDUCATION:  Education details: Eval findings, POC, HEP, self care  Person educated: Patient Education method: Explanation, Demonstration, Tactile cues, Verbal cues, and Handouts Education comprehension: verbalized understanding, returned demonstration, verbal cues required, and tactile cues required   HOME EXERCISE PROGRAM: Access Code: [email or web address] URL: [email or web address] Date: 08/18/2022 Prepared by: Gar Ponto  Exercises - Standing Cervical Retraction  - 6 x daily - 7 x weekly - 3 sets - 3-5 reps - 5 hold - Supine Cervical Retraction with Towel  - 2 x daily - 7 x weekly - 1 sets - 5 reps - 5 hold - Supine DNF Liftoffs  - 2 x daily - 7 x weekly - 1 sets -  5 reps - 5 hold - Shoulder External Rotation and Scapular Retraction with Resistance  - 1 x daily - 7 x weekly - 2 sets - 10 reps - 3 hold - Standing Shoulder Horizontal Abduction with Resistance  - 1 x daily - 7 x weekly - 2 sets - 10 reps - 3 hold - Seated Cervical Sidebending Stretch  - 3 x daily - 7 x weekly - 1 sets - 3-5 reps - 15 hold - Standing Cervical Retraction with Sidebending  - 3 x daily - 7 x weekly - 1 sets - 3-5 reps - 15 hold - Standing Cervical Rotation AROM with Overpressure  - 3 x daily - 7 x weekly - 1 sets - 10 reps - 15 hold - Seated Cervical Retraction and Rotation  - 3 x daily - 7 x weekly - 1 sets - 10 reps - 15 hold - Doorway Pec Stretch at 90 Degrees Abduction  - 1 x daily - 7 x weekly - 1 sets - 10 reps - 30 hold   ASSESSMENT:  CLINICAL IMPRESSION: PT was completed for manual therapy as noted above f/b TPDN to the bilat upper traps and L suboccipitals and upper cervical paraspinals. Muscle twitches were elicited. Therex was then completed for postural and posterior chain strengthening. Will assess pt's complete response to today's PT session with her next scheduled visit. Pt tolerated PT today without adverse effects. Pt will continue to benefit from skilled PT to address impairments for improved neck function with less pain.   OBJECTIVE IMPAIRMENTS: decreased ROM, decreased strength, increased fascial restrictions, increased muscle spasms, postural dysfunction, and obesity.   ACTIVITY LIMITATIONS: carrying, lifting, reach over head, and caring for others   PARTICIPATION LIMITATIONS: meal prep, cleaning, laundry, driving, and occupation   PERSONAL FACTORS: Past/current experiences, Profession, Time since onset of injury/illness/exacerbation, and 1 comorbidity: carpal tunnel releases  are also affecting patient's functional outcome.    REHAB POTENTIAL: Good   CLINICAL DECISION MAKING: Evolving/moderate complexity   EVALUATION COMPLEXITY: Moderate     GOALS:    SHORT TERM GOALS = LTGs   LONG TERM GOALS: Target date: 09/22/22   Pt will be Ind in a final HEP to maintain achieved LOF Baseline: initiated Goal status: INITIAL   2.  Increase cervical side bending by 5d and rotation by 10d for improved neck function c less pain Baseline: see flow sheets Status: 08/16/22=  see flow sheets Goal status: MET   3.  Increase DNF endurance to 30" for improved neck function c less pain Baseline: 11" Goal status: INITIAL   4.  Pt will report improve quality of with pt able to sleep comfortably for 6 hours Baseline: 2 hours Goal status: INITIAL   5.  Pt will report a decrease in neck pain to 4/10 or less with daily activities  Baseline: 3-9/10 Goal status: INITIAL   6.  Pt's NDI score will improve to 12/50, 24% Min disability or less as indication of improved function Baseline: 17/50 35% Mod disability Goal status: INITIAL     PLAN:   PT FREQUENCY: 2x/week   PT DURATION: 6 weeks   PLANNED INTERVENTIONS: Therapeutic exercises, Therapeutic activity, Patient/Family education, Self Care, Aquatic Therapy, Dry Needling, Electrical stimulation, Spinal manipulation, Spinal mobilization, Cryotherapy, Moist heat, Taping, Vasopneumatic device, Traction, Ultrasound, Ionotophoresis '4mg'$ /ml Dexamethasone, Manual therapy, and Re-evaluation   PLAN FOR NEXT SESSION: Assess response to HEP; progress therex as indicated; use of modalities, manual therapy; and TPDN as indicated. Trial of estim.  Ariannie Penaloza MS, PT 08/29/22 11:01 AM

## 2022-09-01 ENCOUNTER — Ambulatory Visit: Payer: Medicaid Other

## 2022-09-04 ENCOUNTER — Ambulatory Visit (INDEPENDENT_AMBULATORY_CARE_PROVIDER_SITE_OTHER): Payer: Medicaid Other | Admitting: Nurse Practitioner

## 2022-09-04 ENCOUNTER — Encounter: Payer: Self-pay | Admitting: Nurse Practitioner

## 2022-09-04 ENCOUNTER — Other Ambulatory Visit: Payer: Self-pay

## 2022-09-04 ENCOUNTER — Ambulatory Visit: Payer: Medicaid Other | Admitting: Physical Therapy

## 2022-09-04 ENCOUNTER — Encounter: Payer: Self-pay | Admitting: Physical Therapy

## 2022-09-04 VITALS — BP 132/82 | HR 76 | Temp 98.6°F | Ht 60.0 in | Wt 251.0 lb

## 2022-09-04 DIAGNOSIS — R7303 Prediabetes: Secondary | ICD-10-CM | POA: Diagnosis not present

## 2022-09-04 DIAGNOSIS — I2510 Atherosclerotic heart disease of native coronary artery without angina pectoris: Secondary | ICD-10-CM

## 2022-09-04 DIAGNOSIS — M542 Cervicalgia: Secondary | ICD-10-CM

## 2022-09-04 DIAGNOSIS — I119 Hypertensive heart disease without heart failure: Secondary | ICD-10-CM

## 2022-09-04 DIAGNOSIS — F5101 Primary insomnia: Secondary | ICD-10-CM

## 2022-09-04 DIAGNOSIS — R293 Abnormal posture: Secondary | ICD-10-CM

## 2022-09-04 DIAGNOSIS — L659 Nonscarring hair loss, unspecified: Secondary | ICD-10-CM

## 2022-09-04 DIAGNOSIS — R252 Cramp and spasm: Secondary | ICD-10-CM

## 2022-09-04 DIAGNOSIS — Z6841 Body Mass Index (BMI) 40.0 and over, adult: Secondary | ICD-10-CM

## 2022-09-04 DIAGNOSIS — G8929 Other chronic pain: Secondary | ICD-10-CM

## 2022-09-04 DIAGNOSIS — I10 Essential (primary) hypertension: Secondary | ICD-10-CM

## 2022-09-04 MED ORDER — MAGNESIUM GLUCONATE 250 MG PO TABS: 1.0000 | ORAL_TABLET | Freq: Every evening | ORAL | 1 refills | Status: DC

## 2022-09-04 NOTE — Therapy (Signed)
OUTPATIENT PHYSICAL THERAPY TREATMENT NOTE   Patient Name: [name] MRN: [id number] DOB:July 01, 1976, 47 y.o., female Today's Date: 09/04/2022  PCP: Minette Brine, FNP  REFERRING PROVIDER: Aundra Dubin, PA-C    END OF SESSION:   PT End of Session - 09/04/22 1327     Visit Number 7    Number of Visits 13    Date for PT Re-Evaluation 09/22/22    Authorization Type  [place]    Authorization - Visit Number 6    Authorization - Number of Visits 14    PT Start Time [id number]    PT Stop Time 1400    PT Time Calculation (min) 38 min    Activity Tolerance Patient tolerated treatment well    Behavior During Therapy WFL for tasks assessed/performed               Past Medical History:  Diagnosis Date   Anxiety    Cancer ([place])    ovarian cancer   Carpal tunnel syndrome of right wrist 11/2013   Depression    Ganglion cyst of wrist 11/2013   right   Hypertension    is on 3 meds. - states BP is under control; has been on med. x 12 yr.   Migraines    Right tennis elbow 11/2013   Past Surgical History:  Procedure Laterality Date   ABDOMINAL HYSTERECTOMY     complete   CARPAL TUNNEL RELEASE Right 12/12/2013   Procedure: ENDOSCOPY RIGHT WRIST CARPAL TUNNEL RELEASE EXCISION GANGLION WRIST PRIMARY ;  Surgeon: Renette Butters, MD;  Location: [place];  Service: Orthopedics;  Laterality: Right;   CARPAL TUNNEL RELEASE Left 05/31/2022   Procedure: LEFT CARPAL TUNNEL RELEASE;  Surgeon: Leandrew Koyanagi, MD;  Location: [place];  Service: Orthopedics;  Laterality: Left;   CESAREAN SECTION  03/11/2005; 04/05/2008   CHOLECYSTECTOMY  02/11/2004   lap. chole.   LATERAL EPICONDYLE RELEASE Right 12/12/2013   Procedure: RIGHT TENNIS ELBOW RELEASE WITH BONE REPAIR;  Surgeon: Renette Butters, MD;  Location: [place];  Service: Orthopedics;  Laterality: Right;   LUMBAR LAMINECTOMY WITH COFLEX 1 LEVEL Bilateral 01/14/2015   Procedure:  Laminectomy - bilateral - L4-L5 ;  Surgeon: Karie Chimera, MD;  Location: [place] [facility];  Service: Neurosurgery;  Laterality: Bilateral;  Laminectomy - bilateral - L4-L5    LUMBAR WOUND DEBRIDEMENT N/A 02/02/2015   Procedure: LUMBAR WOUND DEBRIDEMENT, WASHOUT, CLOSURE;  Surgeon: Consuella Lose, MD;  Location: [place] [facility];  Service: Neurosurgery;  Laterality: N/A;  lumbar wound debridement   TUBAL LIGATION  04/05/2008   Patient Active Problem List   Diagnosis Date Noted   Current mild episode of major depressive disorder, unspecified whether recurrent ([place]) 01/30/2022   Carpal tunnel syndrome on left 11/28/2021   Numbness and tingling in left hand 10/11/2021   Abdominal cramping 09/26/2021   GAD (generalized anxiety disorder) 08/30/2017   Migraine without aura 08/10/2015   Spinal stenosis, lumbar 01/14/2015   Carpal tunnel syndrome 12/12/2013   HYPERLIPIDEMIA 12/05/2007   HYPERTENSION 12/05/2007   BRADYCARDIA 12/05/2007   DEGENERATIVE JOINT DISEASE 12/05/2007    REFERRING DIAG: Cervicalgia   THERAPY DIAG:  Cervicalgia  Cramp and spasm  Abnormal posture  Rationale for Evaluation and Treatment Rehabilitation  ONSET DATE: Approx 1 year    SUBJECTIVE:  SUBJECTIVE STATEMENT: Patient reports she is hurting on the left side today.   Are you having pain?  Yes: NPRS scale: 6/10 Pain location: Neck, left Pain description: Dull, burning Aggravating factors: Sleeping, hurts all the time Relieving factors: Stretching my neck back At night 8-9/10  PERTINENT HISTORY:  HTN; carpal tunnel release R 11/25/13, L 05/31/22; Lumbar laminectomy 01/14/15    PRECAUTIONS: None   WEIGHT BEARING RESTRICTIONS: No   FALLS:  Has patient fallen in last 6 months? No   LIVING ENVIRONMENT: Lives with: lives with their family Lives in: House/apartment Pt reports she is able to access and be mobilie within her home   OCCUPATION: Billing-computer work    PLOF: Independent   PATIENT GOALS: Pain relief    OBJECTIVE: (objective measures completed at initial evaluation unless otherwise dated) DIAGNOSTIC FINDINGS:  05/24/22 X-rays demonstrate moderate degenerative changes C5-6 C6-7 with  straightening of the cervical spine   PATIENT SURVEYS:  NDI 17/50= 34%  Moderate disability   POSTURE: rounded shoulders, forward head, and increased thoracic kyphosis, and CT step off   PALPATION: TTP peri-CT junction and paraspinal and upper trap areas        CERVICAL ROM:    Active ROM A/PROM (deg) eval AROM 08/16/22  09/04/2022  Flexion 43 no P  45  Extension 44 P ant  55  Right lateral flexion 45 pull L 50   Left lateral flexion 40 pull R 50   Right rotation 60 no P 70 70  Left rotation 50 np P 65 65  P=provocation  (Blank rows = not tested)   UPPER EXTREMITY ROM: WNLs and equal Active ROM Right eval Left eval  Shoulder flexion      Shoulder extension      Shoulder abduction      Shoulder adduction      Shoulder extension      Shoulder internal rotation      Shoulder external rotation      Elbow flexion      Elbow extension      Wrist flexion      Wrist extension      Wrist ulnar deviation      Wrist radial deviation      Wrist pronation      Wrist supination       (Blank rows = not tested)   UPPER EXTREMITY MMT: Myotome screen negative MMT Right eval Left eval  Shoulder flexion      Shoulder extension      Shoulder abduction      Shoulder adduction      Shoulder extension      Shoulder internal rotation      Shoulder external rotation      Middle trapezius      Lower trapezius      Elbow flexion      Elbow extension      Wrist flexion      Wrist extension      Wrist ulnar deviation      Wrist radial deviation      Wrist pronation      Wrist supination      Grip strength 64,40 19, 19 capal tunnel sugery 11/*/23   (Blank rows = not tested)   CERVICAL SPECIAL TESTS:  Neck flexor muscle endurance test:  Positive, Spurling's test: Negative, and Distraction test: Negative DNF=11"   FUNCTIONAL TESTS:  NT    TODAY'S TREATMENT: [facility] Adult PT Treatment:  DATE: 09/04/22 Therapeutic Exercise: UBE L1 x 4 min (fwd/bwd) Seated upper trap and levator scap stretch 2 x 20 sec each Doorway pec stretch 2 x 20 sec Sidelying thoracic rotation x 10 each Supine chin tuck 2 x 10 with 5 sec hold Supine horizontal abduction with green 2 x 20 Supine chin tuck with lift 10 x 5 sec Seated double ER and scap retraction with green 2 x 20 Row with blue  2 x 20 Extension with green 2 x 20 Standing chin tuck at wall using ball behind neck 2 x 10   [facility] Adult PT Treatment:                                                DATE: 08/25/22 Therapeutic Exercise: Supine Cervical Retraction 5 reps - 5 hold Supine DNF Liftoffs 5 reps  8 hold Standing Cervical Retraction  5 reps 5 hold standing against wall Seated Cervical Sidebending Stretch  3 reps - 15 hold each Standing Cervical Retraction with Side bending 3 reps - 15 hold eac Cervical retraction at wall x5 5"- postural feedback Standing Green band retraction with bilat ER 2x 15 Standing horizontal abduction star 5 x 2 +issued band  Manual Therapy: STM to the cervical paraspinals and upper traps Suboccipital release Skilled palpation to identify TrPs and taut muscle bands Trigger Point Dry Needling Treatment: Pre-treatment instruction: Patient instructed on dry needling rationale, procedures, and possible side effects including pain during treatment (achy,cramping feeling), bruising, drop of blood, lightheadedness, nausea, sweating. Patient Consent Given: Yes Education handout provided: Yes Muscles treated: L and R upper traps  and L suboccipitals and upper cervical paraspinals Needle size and number: .30x4m x 1; .30x347mx 2 Electrical stimulation performed: No Parameters: N/A Treatment response/outcome: Twitch  response elicited Post-treatment instructions: Patient instructed to expect possible mild to moderate muscle soreness later today and/or tomorrow. Patient instructed in methods to reduce muscle soreness and to continue prescribed HEP. If patient was dry needled over the lung field, patient was instructed on signs and symptoms of pneumothorax and, however unlikely, to see immediate medical attention should they occur. Patient was also educated on signs and symptoms of infection and to seek medical attention should they occur. Patient verbalized understanding of these instructions and education.   [facility]                                                [facility]: [facility] Therapeutic Exercise: Seated Green band retraction with bilat ER x 20+ issued band Seated horizontal abduction 10 x 2 +issued band  Supine DNF 5 sec x 10 Manual Therapy: STW to mid and upper traps , sub occipitals  Self Care: Theracane education for self TPR Computer station set up  [facility]                                                DATE: 08/18/22 Therapeutic Exercise: Supine Cervical Retraction 5 reps - 5 hold Supine DNF Liftoffs 5 reps  5 hold Supine shoulder hor star pattern 2x5 GTB Supine shoulder  ER 2x10 GTB Standing Cervical Retraction  5 reps 5 hold Seated Cervical Sidebending Stretch  2 reps - 15 hold each Seated Cervical Rotation 2 reps - 15 hold each Seated Cervical Rotation with Side bending 2 reps - 15 hold each Manual Therapy: STM to the cervical paraspinals and upper traps with MTPR Suboccipital release Self Care: Tennis balls for cervical and suboccipital massage Tennis ball for upper shoulder and mid back massage   PATIENT EDUCATION:  Education details: HEP update Person educated: Patient Education method: Explanation, Demonstration, Tactile cues, Verbal cues, and Handouts Education comprehension: verbalized understanding, returned demonstration, verbal cues required,  and tactile cues required   HOME EXERCISE PROGRAM: Access Code: [email or web address]    ASSESSMENT:  CLINICAL IMPRESSION: Patient tolerated therapy well with no adverse effects. She does demonstrate improved cervical range of motion this visit. Therapy focused primarily on progressing her mobility and postural strength and control. She was reporting left sided neck pain this visit that was improved following stretching and exercises. Updated her HEP to banded row for postural strengthening. She did require consistent cueing for for posture and avoiding shrug with exercises. Patient would benefit from continued skilled PT to progress her mobility and strength in order to reduce pain and maximize functional ability.   OBJECTIVE IMPAIRMENTS: decreased ROM, decreased strength, increased fascial restrictions, increased muscle spasms, postural dysfunction, and obesity.   ACTIVITY LIMITATIONS: carrying, lifting, reach over head, and caring for others   PARTICIPATION LIMITATIONS: meal prep, cleaning, laundry, driving, and occupation   PERSONAL FACTORS: Past/current experiences, Profession, Time since onset of injury/illness/exacerbation, and 1 comorbidity: carpal tunnel releases  are also affecting patient's functional outcome.      GOALS: SHORT TERM GOALS = LTGs   LONG TERM GOALS: Target date: 09/22/22   Pt will be Ind in a final HEP to maintain achieved LOF Baseline: initiated Goal status: INITIAL   2.  Increase cervical side bending by 5d and rotation by 10d for improved neck function c less pain Baseline: see flow sheets Status: 08/16/22= see flow sheets Goal status: MET   3.  Increase DNF endurance to 30" for improved neck function c less pain Baseline: 11" Goal status: INITIAL   4.  Pt will report improve quality of with pt able to sleep comfortably for 6 hours Baseline: 2 hours Goal status: INITIAL   5.  Pt will report a decrease in neck pain to 4/10 or less with daily activities   Baseline: 3-9/10 Goal status: INITIAL   6.  Pt's NDI score will improve to 12/50, 24% Min disability or less as indication of improved function Baseline: 17/50 35% Mod disability Goal status: INITIAL     PLAN: PT FREQUENCY: 2x/week   PT DURATION: 6 weeks   PLANNED INTERVENTIONS: Therapeutic exercises, Therapeutic activity, Patient/Family education, Self Care, Aquatic Therapy, Dry Needling, Electrical stimulation, Spinal manipulation, Spinal mobilization, Cryotherapy, Moist heat, Taping, Vasopneumatic device, Traction, Ultrasound, Ionotophoresis 58m/ml Dexamethasone, Manual therapy, and Re-evaluation   PLAN FOR NEXT SESSION: Assess response to HEP; progress therex as indicated; use of modalities, manual therapy; and TPDN as indicated. Trial of estim.   CHilda Blades PT, DPT, LAT, ATC 09/04/22  2:08 PM Phone: [phone]: [phone]

## 2022-09-04 NOTE — Progress Notes (Addendum)
I,Sheena H Holbrook,acting as a Education administrator for Minette Brine, FNP.,have documented all relevant documentation on the behalf of Minette Brine, FNP,as directed by  Minette Brine, FNP while in the presence of Minette Brine, [place].    Subjective:     Patient ID: [name] , female    DOB: 1975-09-24 , 47 y.o.   MRN: [id number]   Chief Complaint  Patient presents with   Hypertension    HPI  Patient presents today for htn follow up.  She had a stent placed in December and is now on a blood thinner and cholesterol medication. She does not have a f/u scheduled with them at this time.  She is losing hair, not sure why and now they are weak. She is drinking 4-5 water bottles a day.   Hypertension This is a chronic problem. The current episode started more than 1 year ago. The problem is unchanged. The problem is controlled. Pertinent negatives include no anxiety or headaches. Risk factors for coronary artery disease include obesity and sedentary lifestyle. Past treatments include diuretics, calcium channel blockers and angiotensin blockers. There are no compliance problems.  There is no history of angina. There is no history of chronic renal disease.     Past Medical History:  Diagnosis Date   Anxiety    Cancer [facility])    ovarian cancer   Carpal tunnel syndrome of right wrist 11/21/2013   Coronary artery disease    Depression    Ganglion cyst of wrist 11/21/2013   right   Hypertension    is on 3 meds. - states BP is under control; has been on med. x 12 yr.   Migraines    Right tennis elbow 11/21/2013     Family History  Problem Relation Age of Onset   Hypertension Mother    Diabetes Mother    Cancer Mother    Diabetes Brother    Hypertension Brother    Diabetes Brother    Hypertension Maternal Grandmother    Heart attack Paternal Grandmother    Cancer Other      Current Outpatient Medications:    amLODipine (NORVASC) 10 MG tablet, Take 1 tablet (10 mg total) by mouth daily.,  Disp: 90 tablet, Rfl: 0   atorvastatin (LIPITOR) 40 MG tablet, Take 40 mg by mouth daily., Disp: , Rfl:    clopidogrel (PLAVIX) 75 MG tablet, Take 1 tablet by mouth daily., Disp: , Rfl:    estradiol (ESTRACE) 2 MG tablet, Take 1 tablet (2 mg total) by mouth daily., Disp: 30 tablet, Rfl: 1   hydrochlorothiazide (HYDRODIURIL) 25 MG tablet, Take 1 tablet (25 mg total) by mouth every morning., Disp: 90 tablet, Rfl: 1   losartan (COZAAR) 100 MG tablet, Take 1 tablet (100 mg total) by mouth daily., Disp: 90 tablet, Rfl: 1   Magnesium Gluconate 250 MG TABS, Take 1 tablet (250 mg total) by mouth every evening. Take with evening meal (Patient not taking: Reported on 10/10/2022), Disp: 90 tablet, Rfl: 1   methocarbamol (ROBAXIN-750) 750 MG tablet, Take 1 tablet (750 mg total) by mouth 2 (two) times daily as needed for muscle spasms., Disp: 20 tablet, Rfl: 2   pantoprazole (PROTONIX) 40 MG tablet, Take 1 tablet (40 mg total) by mouth daily., Disp: 90 tablet, Rfl: 1   potassium chloride SA (KLOR-CON M) 20 MEQ tablet, Take 20 mEq by mouth 2 (two) times daily. (Patient not taking: Reported on 10/12/2022), Disp: , Rfl:    Vitamin D, Ergocalciferol, (DRISDOL) 1.25  MG (50000 UNIT) CAPS capsule, Take 1 capsule (50,000 Units total) by mouth every 7 (seven) days., Disp: 12 capsule, Rfl: 1   vortioxetine HBr (TRINTELLIX) 5 MG TABS tablet, Take 1 tablet (5 mg total) by mouth daily., Disp: 30 tablet, Rfl: 2   Allergies  Allergen Reactions   Venlafaxine Anaphylaxis and Nausea Only    Insomnia; Depression Insomnia; Depression      Review of Systems  Constitutional: Negative.   Respiratory: Negative.    Cardiovascular: Negative.   Gastrointestinal: Negative.   Neurological: Negative.  Negative for headaches.     Today's Vitals   09/04/22 1448  BP: 132/82  Pulse: 76  Temp: 98.6 F (37 C)  TempSrc: Oral  SpO2: 97%  Weight: 251 lb (113.9 kg)  Height: 5' (1.524 m)   Body mass index is 49.02 kg/m.    Objective:  Physical Exam Vitals reviewed.  Constitutional:      General: She is not in acute distress.    Appearance: Normal appearance. She is obese.  Cardiovascular:     Rate and Rhythm: Normal rate and regular rhythm.     Pulses: Normal pulses.     Heart sounds: Normal heart sounds. No murmur heard. Pulmonary:     Effort: Pulmonary effort is normal. No respiratory distress.     Breath sounds: Normal breath sounds. No wheezing.  Skin:    General: Skin is warm and dry.     Capillary Refill: Capillary refill takes less than 2 seconds.  Neurological:     General: No focal deficit present.     Mental Status: She is alert and oriented to person, place, and time.     Cranial Nerves: No cranial nerve deficit.     Motor: No weakness.  Psychiatric:        Mood and Affect: Mood normal.        Behavior: Behavior normal.        Thought Content: Thought content normal.        Judgment: Judgment normal.         Assessment And Plan:     1. Benign hypertensive heart disease without heart failure Comments: Blood pressure is elevated, discussed making lifestyle modifications - TSH - Vitamin B12  2. Primary insomnia Comments: She is to take magnesium with evening meal. - Magnesium Gluconate 250 MG TABS; Take 1 tablet (250 mg total) by mouth every evening. Take with evening meal (Patient not taking: Reported on 10/10/2022)  Dispense: 90 tablet; Refill: 1  3. Prediabetes Comments: HgbA1c is stable, continue to focus on healthy diet low in sugar and starches. Increase physical activity to 150 minutes per week and once cleared by Cardiology - Hemoglobin A1c  4. Coronary artery disease involving native coronary artery of native heart without angina pectoris Comments: Reviewed [facility] for her admssion for stent placement in November  5. Chronic neck pain Comments: Currently in PT for this, after this she will start Cardiac Rehab, has about 3-4 visits.  6. Hair  loss Comments: If metabolic causes are normal change HCTZ to amlodipine. - Lipid panel - VITAMIN D 25 Hydroxy (Vit-D Deficiency, Fractures)  7. Class 3 severe obesity due to excess calories with body mass index (BMI) of 45.0 to 49.9 in adult, unspecified whether serious comorbidity present [facility]) She is encouraged to strive for BMI less than 30 to decrease cardiac risk. Advised to aim for at least 150 minutes of exercise per week as tolerated and once cleared by Cardiology  Patient was given opportunity to ask questions. Patient verbalized understanding of the plan and was able to repeat key elements of the plan. All questions were answered to their satisfaction.  Minette Brine, FNP   I, Minette Brine, FNP, have reviewed all documentation for this visit. The documentation on 09/04/22 for the exam, diagnosis, procedures, and orders are all accurate and complete.   IF YOU HAVE BEEN REFERRED TO A SPECIALIST, IT MAY TAKE 1-2 WEEKS TO SCHEDULE/PROCESS THE REFERRAL. IF YOU HAVE NOT HEARD FROM US/SPECIALIST IN TWO WEEKS, PLEASE GIVE [place] A CALL AT [phone].   THE PATIENT IS ENCOURAGED TO PRACTICE SOCIAL DISTANCING DUE TO THE COVID-19 PANDEMIC.

## 2022-09-04 NOTE — Patient Instructions (Signed)
Access Code: [email or web address] URL: [email or web address] Date: 09/04/2022 Prepared by: Hilda Blades  Exercises - Standing Cervical Retraction  - 6 x daily - 7 x weekly - 3 sets - 3-5 reps - 5 hold - Supine Cervical Retraction with Towel  - 2 x daily - 7 x weekly - 1 sets - 5 reps - 5 hold - Supine DNF Liftoffs  - 2 x daily - 7 x weekly - 1 sets - 5 reps - 5 hold - Shoulder External Rotation and Scapular Retraction with Resistance  - 1 x daily - 7 x weekly - 2 sets - 10 reps - 3 hold - Standing Shoulder Horizontal Abduction with Resistance  - 1 x daily - 7 x weekly - 2 sets - 10 reps - 3 hold - Standing Row with Anchored Resistance  - 1 x daily - 2 sets - 20 reps - Seated Cervical Sidebending Stretch  - 3 x daily - 7 x weekly - 1 sets - 3-5 reps - 15 hold - Standing Cervical Retraction with Sidebending  - 3 x daily - 7 x weekly - 1 sets - 3-5 reps - 15 hold - Standing Cervical Rotation AROM with Overpressure  - 3 x daily - 7 x weekly - 1 sets - 10 reps - 15 hold - Seated Cervical Retraction and Rotation  - 3 x daily - 7 x weekly - 1 sets - 10 reps - 15 hold - Doorway Pec Stretch at 90 Degrees Abduction  - 1 x daily - 7 x weekly - 1 sets - 10 reps - 30 hold

## 2022-09-05 ENCOUNTER — Other Ambulatory Visit: Payer: Self-pay | Admitting: Nurse Practitioner

## 2022-09-05 LAB — LIPID PANEL
Chol/HDL Ratio: 3.4 ratio (ref 0.0–4.4)
Cholesterol, Total: 149 mg/dL (ref 100–199)
HDL: 44 mg/dL (ref >39)
LDL Chol Calc (NIH): 75 mg/dL (ref 0–99)
Triglycerides: 176 mg/dL — ABNORMAL HIGH (ref 0–149)
VLDL Cholesterol Cal: 30 mg/dL (ref 5–40)

## 2022-09-05 LAB — VITAMIN D 25 HYDROXY (VIT D DEFICIENCY, FRACTURES): Vit D, 25-Hydroxy: 19.2 ng/mL — ABNORMAL LOW (ref 30.0–100.0)

## 2022-09-05 LAB — HEMOGLOBIN A1C
Est. average glucose Bld gHb Est-mCnc: 128 mg/dL
Hgb A1c MFr Bld: 6.1 % — ABNORMAL HIGH (ref 4.8–5.6)

## 2022-09-05 LAB — TSH: TSH: 2.93 u[IU]/mL (ref 0.450–4.500)

## 2022-09-05 LAB — VITAMIN B12: Vitamin B-12: 801 pg/mL (ref 232–1245)

## 2022-09-05 MED ORDER — VITAMIN D (ERGOCALCIFEROL) 1.25 MG (50000 UNIT) PO CAPS: 50000.0000 [IU] | ORAL_CAPSULE | ORAL | 1 refills | Status: DC

## 2022-09-07 NOTE — Therapy (Signed)
OUTPATIENT PHYSICAL THERAPY TREATMENT NOTE   Patient Name: [name] MRN: [id number] DOB:08/30/1975, 47 y.o., female Today's Date: 09/08/2022  PCP: Minette Brine, FNP  REFERRING PROVIDER: Aundra Dubin, PA-C    END OF SESSION:   PT End of Session - 09/08/22 0853     Visit Number 8    Number of Visits 13    Date for PT Re-Evaluation 09/22/22    Authorization Type [place] MEDICAID [facility]    Authorization Time Period 14 visits    Authorization - Visit Number 7    Authorization - Number of Visits 14    Progress Note Due on Visit 10    PT Start Time 0847    PT Stop Time 0930    PT Time Calculation (min) 43 min    Activity Tolerance Patient tolerated treatment well    Behavior During Therapy [facility] for tasks assessed/performed                Past Medical History:  Diagnosis Date   Anxiety    Cancer ([place])    ovarian cancer   Carpal tunnel syndrome of right wrist 11/2013   Depression    Ganglion cyst of wrist 11/2013   right   Hypertension    is on 3 meds. - states BP is under control; has been on med. x 12 yr.   Migraines    Right tennis elbow 11/2013   Past Surgical History:  Procedure Laterality Date   ABDOMINAL HYSTERECTOMY     complete   CARPAL TUNNEL RELEASE Right 12/12/2013   Procedure: ENDOSCOPY RIGHT WRIST CARPAL TUNNEL RELEASE EXCISION GANGLION WRIST PRIMARY ;  Surgeon: Renette Butters, MD;  Location: [place];  Service: Orthopedics;  Laterality: Right;   CARPAL TUNNEL RELEASE Left 05/31/2022   Procedure: LEFT CARPAL TUNNEL RELEASE;  Surgeon: Leandrew Koyanagi, MD;  Location: [place];  Service: Orthopedics;  Laterality: Left;   CESAREAN SECTION  03/11/2005; 04/05/2008   CHOLECYSTECTOMY  02/11/2004   lap. chole.   LATERAL EPICONDYLE RELEASE Right 12/12/2013   Procedure: RIGHT TENNIS ELBOW RELEASE WITH BONE REPAIR;  Surgeon: Renette Butters, MD;  Location: [place];  Service: Orthopedics;  Laterality: Right;    LUMBAR LAMINECTOMY WITH COFLEX 1 LEVEL Bilateral 01/14/2015   Procedure: Laminectomy - bilateral - L4-L5 ;  Surgeon: Karie Chimera, MD;  Location: [place] [facility];  Service: Neurosurgery;  Laterality: Bilateral;  Laminectomy - bilateral - L4-L5    LUMBAR WOUND DEBRIDEMENT N/A 02/02/2015   Procedure: LUMBAR WOUND DEBRIDEMENT, WASHOUT, CLOSURE;  Surgeon: Consuella Lose, MD;  Location: [place] [facility];  Service: Neurosurgery;  Laterality: N/A;  lumbar wound debridement   TUBAL LIGATION  04/05/2008   Patient Active Problem List   Diagnosis Date Noted   Class 3 severe obesity due to excess calories with body mass index (BMI) of 45.0 to 49.9 in adult, unspecified whether serious comorbidity present ([place]) 09/04/2022   Chronic neck pain 09/04/2022   Coronary artery disease involving native coronary artery of native heart without angina pectoris 06/27/2022   Carpal tunnel syndrome on left 11/28/2021   Numbness and tingling in left hand 10/11/2021   Abdominal cramping 09/26/2021   Hormone replacement therapy 09/26/2021   Pelvic mass 04/11/2021   Teratoma, malignant ([place]) 09/21/2020   Premature atrial contractions 06/10/2020   PVC (premature ventricular contraction) 06/10/2020   Acanthosis nigricans, acquired 06/14/2018   Prediabetes 06/14/2018   GAD (generalized anxiety disorder) 08/30/2017  Hypoestrogenism 08/30/2017   Migraine without aura 08/10/2015   GERD without esophagitis 08/10/2015   Obesity due to excess calories with serious comorbidity 08/10/2015   Primary insomnia 08/10/2015   Spinal stenosis, lumbar 01/14/2015   Carpal tunnel syndrome 12/12/2013   HYPERLIPIDEMIA 12/05/2007   Essential hypertension 12/05/2007   BRADYCARDIA 12/05/2007   DEGENERATIVE JOINT DISEASE 12/05/2007    REFERRING DIAG: Cervicalgia   THERAPY DIAG:  Cervicalgia  Cramp and spasm  Abnormal posture  Rationale for Evaluation and Treatment Rehabilitation  ONSET DATE: Approx 1 year    SUBJECTIVE:                                        SUBJECTIVE STATEMENT: Patient reports she starting hurting more yesterday after completig 30 flower arrangements for Valentine's Day. Pt indicates pain of her upper traps and in the C-T junction.  Are you having pain?  Yes: NPRS scale: 7/10 Pain location: Neck, left Pain description: Dull, burning Aggravating factors: Sleeping, hurts all the time Relieving factors: Stretching my neck back At night 8-9/10  PERTINENT HISTORY:  HTN; carpal tunnel release R 11/25/13, L 05/31/22; Lumbar laminectomy 01/14/15    PRECAUTIONS: None   WEIGHT BEARING RESTRICTIONS: No   FALLS:  Has patient fallen in last 6 months? No   LIVING ENVIRONMENT: Lives with: lives with their family Lives in: House/apartment Pt reports she is able to access and be mobilie within her home   OCCUPATION: Billing-computer work   PLOF: Independent   PATIENT GOALS: Pain relief    OBJECTIVE: (objective measures completed at initial evaluation unless otherwise dated) DIAGNOSTIC FINDINGS:  05/24/22 X-rays demonstrate moderate degenerative changes C5-6 C6-7 with  straightening of the cervical spine   PATIENT SURVEYS:  NDI 17/50= 34%  Moderate disability   POSTURE: rounded shoulders, forward head, and increased thoracic kyphosis, and CT step off   PALPATION: TTP peri-CT junction and paraspinal and upper trap areas        CERVICAL ROM:    Active ROM A/PROM (deg) eval AROM 08/16/22  09/04/2022  Flexion 43 no P  45  Extension 44 P ant  55  Right lateral flexion 45 pull L 50   Left lateral flexion 40 pull R 50   Right rotation 60 no P 70 70  Left rotation 50 np P 65 65  P=provocation  (Blank rows = not tested)   UPPER EXTREMITY ROM: WNLs and equal Active ROM Right eval Left eval  Shoulder flexion      Shoulder extension      Shoulder abduction      Shoulder adduction      Shoulder extension      Shoulder internal rotation      Shoulder external rotation      Elbow flexion       Elbow extension      Wrist flexion      Wrist extension      Wrist ulnar deviation      Wrist radial deviation      Wrist pronation      Wrist supination       (Blank rows = not tested)   UPPER EXTREMITY MMT: Myotome screen negative MMT Right eval Left eval  Shoulder flexion      Shoulder extension      Shoulder abduction      Shoulder adduction      Shoulder extension  Shoulder internal rotation      Shoulder external rotation      Middle trapezius      Lower trapezius      Elbow flexion      Elbow extension      Wrist flexion      Wrist extension      Wrist ulnar deviation      Wrist radial deviation      Wrist pronation      Wrist supination      Grip strength 64,40 19, 19 capal tunnel sugery 11/*/23   (Blank rows = not tested)   CERVICAL SPECIAL TESTS:  Neck flexor muscle endurance test: Positive, Spurling's test: Negative, and Distraction test: Negative DNF=11"   FUNCTIONAL TESTS:  NT    TODAY'S TREATMENT:  [facility] Adult PT Treatment:                                                DATE: 09/08/22 Therapeutic Exercise: Supine Cervical Retraction 10 reps - 5 hold Supine DNF Liftoffs 5 reps  8 hold Cervical Sidebending Stretch  2 reps - 15 hold each Cervical Rotation with Side bending 2 reps - 15 hold each Thoracic rotation cross arm stretch 2 reps - 15 sec hold  Standing open book x5 5sec Standing horizontal abduction star 5 x 2 Thoracic ext c soft foam roller c pec stretch Manual Therapy: STM to the lower cervical/ upper thoracic paraspinals and upper traps Skilled palpation to identify TrPs and taut muscle bands Trigger Point Dry Needling Treatment: Pre-treatment instruction: Patient instructed on dry needling rationale, procedures, and possible side effects including pain during treatment (achy,cramping feeling), bruising, drop of blood, lightheadedness, nausea, sweating. Patient Consent Given: Yes Education handout provided: Yes Muscles treated:  L and R upper traps and upper thoracic cervical paraspinals Needle size and number: .30x78m x 2 Electrical stimulation performed: No Parameters: N/A Treatment response/outcome: Twitch response elicited Post-treatment instructions: Patient instructed to expect possible mild to moderate muscle soreness later today and/or tomorrow. Patient instructed in methods to reduce muscle soreness and to continue prescribed HEP. If patient was dry needled over the lung field, patient was instructed on signs and symptoms of pneumothorax and, however unlikely, to see immediate medical attention should they occur. Patient was also educated on signs and symptoms of infection and to seek medical attention should they occur. Patient verbalized understanding of these instructions and education.   [facility]                                                DATE: 09/04/22 Therapeutic Exercise: UBE L1 x 4 min (fwd/bwd) Seated upper trap and levator scap stretch 2 x 20 sec each Doorway pec stretch 2 x 20 sec Sidelying thoracic rotation x 10 each Supine chin tuck 2 x 10 with 5 sec hold Supine horizontal abduction with green 2 x 20 Supine chin tuck with lift 10 x 5 sec Seated double ER and scap retraction with green 2 x 20 Row with blue  2 x 20 Extension with green 2 x 20 Standing chin tuck at wall using ball behind neck 2 x 10   [facility] Adult PT Treatment:  DATE: 08/25/22 Therapeutic Exercise: Supine Cervical Retraction 5 reps - 5 hold Supine DNF Liftoffs 5 reps  8 hold Standing Cervical Retraction  5 reps 5 hold standing against wall Seated Cervical Sidebending Stretch  3 reps - 15 hold each Standing Cervical Retraction with Side bending 3 reps - 15 hold eac Cervical retraction at wall x5 5"- postural feedback Standing Green band retraction with bilat ER 2x 15 Standing horizontal abduction star 5 x 2 +issued band  Manual Therapy: STM to the cervical  paraspinals and upper traps Suboccipital release Skilled palpation to identify TrPs and taut muscle bands Trigger Point Dry Needling Treatment: Pre-treatment instruction: Patient instructed on dry needling rationale, procedures, and possible side effects including pain during treatment (achy,cramping feeling), bruising, drop of blood, lightheadedness, nausea, sweating. Patient Consent Given: Yes Education handout provided: Yes Muscles treated: L and R upper traps  and L suboccipitals and upper cervical paraspinals Needle size and number: .30x57m x 1; .30x378mx 2 Electrical stimulation performed: No Parameters: N/A Treatment response/outcome: Twitch response elicited Post-treatment instructions: Patient instructed to expect possible mild to moderate muscle soreness later today and/or tomorrow. Patient instructed in methods to reduce muscle soreness and to continue prescribed HEP. If patient was dry needled over the lung field, patient was instructed on signs and symptoms of pneumothorax and, however unlikely, to see immediate medical attention should they occur. Patient was also educated on signs and symptoms of infection and to seek medical attention should they occur. Patient verbalized understanding of these instructions and education.   [facility]                                                DATE: 08[facility]22 Therapeutic Exercise: Seated Green band retraction with bilat ER x 20+ issued band Seated horizontal abduction 10 x 2 +issued band  Supine DNF 5 sec x 10 Manual Therapy: STW to mid and upper traps , sub occipitals  Self Care: Theracane education for self TPR Computer station set up  [facility]                                                DATE: 08/18/22 Therapeutic Exercise: Supine Cervical Retraction 5 reps - 5 hold Supine DNF Liftoffs 5 reps  5 hold Supine shoulder hor star pattern 2x5 GTB Supine shoulder ER 2x10 GTB Standing Cervical Retraction  5  reps 5 hold Seated Cervical Sidebending Stretch  2 reps - 15 hold each Seated Cervical Rotation 2 reps - 15 hold each Seated Cervical Rotation with Side bending 2 reps - 15 hold each Manual Therapy: STM to the cervical paraspinals and upper traps with MTPR Suboccipital release Self Care: Tennis balls for cervical and suboccipital massage Tennis ball for upper shoulder and mid back massage   PATIENT EDUCATION:  Education details: HEP update Person educated: Patient Education method: Explanation, Demonstration, Tactile cues, Verbal cues, and Handouts Education comprehension: verbalized understanding, returned demonstration, verbal cues required, and tactile cues required   HOME EXERCISE PROGRAM: Access Code: [email or web address]    ASSESSMENT:  CLINICAL IMPRESSION: PT was provided for manual therapy as noted above f/b TPDN to the bilat traps and upper thoracic paraspinals. Therex was completed for posterior chain  strengthening thoracic mobility. After the session, pt reported a min decrease in her pain level. Recent increase is associated with prolonged UE use arranging flowers. Pt tolerated PT today without adverse effects. Pt will continue to benefit from skilled PT to address impairments for improved function    OBJECTIVE IMPAIRMENTS: decreased ROM, decreased strength, increased fascial restrictions, increased muscle spasms, postural dysfunction, and obesity.   ACTIVITY LIMITATIONS: carrying, lifting, reach over head, and caring for others   PARTICIPATION LIMITATIONS: meal prep, cleaning, laundry, driving, and occupation   PERSONAL FACTORS: Past/current experiences, Profession, Time since onset of injury/illness/exacerbation, and 1 comorbidity: carpal tunnel releases  are also affecting patient's functional outcome.      GOALS: SHORT TERM GOALS = LTGs   LONG TERM GOALS: Target date: 09/22/22   Pt will be Ind in a final HEP to maintain achieved LOF Baseline: initiated Goal status:  INITIAL   2.  Increase cervical side bending by 5d and rotation by 10d for improved neck function c less pain Baseline: see flow sheets Status: 08/16/22= see flow sheets Goal status: MET   3.  Increase DNF endurance to 30" for improved neck function c less pain Baseline: 11" Goal status: INITIAL   4.  Pt will report improve quality of with pt able to sleep comfortably for 6 hours Baseline: 2 hours Goal status: INITIAL   5.  Pt will report a decrease in neck pain to 4/10 or less with daily activities  Baseline: 3-9/10 Goal status: INITIAL   6.  Pt's NDI score will improve to 12/50, 24% Min disability or less as indication of improved function Baseline: 17/50 35% Mod disability Goal status: INITIAL     PLAN: PT FREQUENCY: 2x/week   PT DURATION: 6 weeks   PLANNED INTERVENTIONS: Therapeutic exercises, Therapeutic activity, Patient/Family education, Self Care, Aquatic Therapy, Dry Needling, Electrical stimulation, Spinal manipulation, Spinal mobilization, Cryotherapy, Moist heat, Taping, Vasopneumatic device, Traction, Ultrasound, Ionotophoresis 55m/ml Dexamethasone, Manual therapy, and Re-evaluation   PLAN FOR NEXT SESSION: Assess response to HEP; progress therex as indicated; use of modalities, manual therapy; and TPDN as indicated. Trial of estim.   Mavery Milling MS, PT 09/08/22 1:08 PM

## 2022-09-08 ENCOUNTER — Ambulatory Visit: Payer: Medicaid Other

## 2022-09-08 DIAGNOSIS — R252 Cramp and spasm: Secondary | ICD-10-CM

## 2022-09-08 DIAGNOSIS — M542 Cervicalgia: Secondary | ICD-10-CM

## 2022-09-08 DIAGNOSIS — R293 Abnormal posture: Secondary | ICD-10-CM

## 2022-09-11 ENCOUNTER — Encounter: Payer: Self-pay | Admitting: Physical Therapy

## 2022-09-11 ENCOUNTER — Ambulatory Visit: Payer: Medicaid Other | Admitting: Physical Therapy

## 2022-09-11 DIAGNOSIS — R293 Abnormal posture: Secondary | ICD-10-CM

## 2022-09-11 DIAGNOSIS — R252 Cramp and spasm: Secondary | ICD-10-CM

## 2022-09-11 DIAGNOSIS — M542 Cervicalgia: Secondary | ICD-10-CM | POA: Diagnosis not present

## 2022-09-11 NOTE — Therapy (Signed)
OUTPATIENT PHYSICAL THERAPY TREATMENT NOTE   Patient Name: [name] MRN: [id number] DOB:Sep 13, 1975, 47 y.o., female Today's Date: 09/11/2022  PCP: Minette Brine, FNP  REFERRING PROVIDER: Aundra Dubin, PA-C    END OF SESSION:   PT End of Session - 09/11/22 1452     Visit Number 9    Number of Visits 13    Date for PT Re-Evaluation 09/22/22    Authorization Type [place] MEDICAID [facility]    Authorization Time Period 14 visits    Authorization - Visit Number 8    Authorization - Number of Visits 14    PT Start Time 1450    PT Stop Time 1535    PT Time Calculation (min) 45 min                Past Medical History:  Diagnosis Date   Anxiety    Cancer ([place])    ovarian cancer   Carpal tunnel syndrome of right wrist 11/2013   Depression    Ganglion cyst of wrist 11/2013   right   Hypertension    is on 3 meds. - states BP is under control; has been on med. x 12 yr.   Migraines    Right tennis elbow 11/2013   Past Surgical History:  Procedure Laterality Date   ABDOMINAL HYSTERECTOMY     complete   CARPAL TUNNEL RELEASE Right 12/12/2013   Procedure: ENDOSCOPY RIGHT WRIST CARPAL TUNNEL RELEASE EXCISION GANGLION WRIST PRIMARY ;  Surgeon: Renette Butters, MD;  Location: [place];  Service: Orthopedics;  Laterality: Right;   CARPAL TUNNEL RELEASE Left 05/31/2022   Procedure: LEFT CARPAL TUNNEL RELEASE;  Surgeon: Leandrew Koyanagi, MD;  Location: [place];  Service: Orthopedics;  Laterality: Left;   CESAREAN SECTION  03/11/2005; 04/05/2008   CHOLECYSTECTOMY  02/11/2004   lap. chole.   LATERAL EPICONDYLE RELEASE Right 12/12/2013   Procedure: RIGHT TENNIS ELBOW RELEASE WITH BONE REPAIR;  Surgeon: Renette Butters, MD;  Location: [employer];  Service: Orthopedics;  Laterality: Right;   LUMBAR LAMINECTOMY WITH COFLEX 1 LEVEL Bilateral 01/14/2015   Procedure: Laminectomy - bilateral - L4-L5 ;  Surgeon: Karie Chimera, MD;  Location: [place]  [facility];  Service: Neurosurgery;  Laterality: Bilateral;  Laminectomy - bilateral - L4-L5    LUMBAR WOUND DEBRIDEMENT N/A 02/02/2015   Procedure: LUMBAR WOUND DEBRIDEMENT, WASHOUT, CLOSURE;  Surgeon: Consuella Lose, MD;  Location: [place] [facility];  Service: Neurosurgery;  Laterality: N/A;  lumbar wound debridement   TUBAL LIGATION  04/05/2008   Patient Active Problem List   Diagnosis Date Noted   Class 3 severe obesity due to excess calories with body mass index (BMI) of 45.0 to 49.9 in adult, unspecified whether serious comorbidity present ([place]) 09/04/2022   Chronic neck pain 09/04/2022   Coronary artery disease involving native coronary artery of native heart without angina pectoris 06/27/2022   Carpal tunnel syndrome on left 11/28/2021   Numbness and tingling in left hand 10/11/2021   Abdominal cramping 09/26/2021   Hormone replacement therapy 09/26/2021   Pelvic mass 04/11/2021   Teratoma, malignant ([place]) 09/21/2020   Premature atrial contractions 06/10/2020   PVC (premature ventricular contraction) 06/10/2020   Acanthosis nigricans, acquired 06/14/2018   Prediabetes 06/14/2018   GAD (generalized anxiety disorder) 08/30/2017   Hypoestrogenism 08/30/2017   Migraine without aura 08/10/2015   GERD without esophagitis 08/10/2015   Obesity due to excess calories with serious comorbidity 08/10/2015   Primary  insomnia 08/10/2015   Spinal stenosis, lumbar 01/14/2015   Carpal tunnel syndrome 12/12/2013   HYPERLIPIDEMIA 12/05/2007   Essential hypertension 12/05/2007   BRADYCARDIA 12/05/2007   DEGENERATIVE JOINT DISEASE 12/05/2007    REFERRING DIAG: Cervicalgia   THERAPY DIAG:  Cervicalgia  Abnormal posture  Cramp and spasm  Rationale for Evaluation and Treatment Rehabilitation  ONSET DATE: Approx 1 year    SUBJECTIVE:                                       SUBJECTIVE STATEMENT: Pt reports she is still having pain lower cervical on right side. Pain increases with reaching or  using both arms and feels like a stab.   Are you having pain?  Yes: NPRS scale: 6/10 Pain location: Neck, left Pain description: Dull, burning Aggravating factors: Sleeping, hurts all the time Relieving factors: Stretching my neck back At night 8-9/10  PERTINENT HISTORY:  HTN; carpal tunnel release R 11/25/13, L 05/31/22; Lumbar laminectomy 01/14/15    PRECAUTIONS: None   WEIGHT BEARING RESTRICTIONS: No   FALLS:  Has patient fallen in last 6 months? No   LIVING ENVIRONMENT: Lives with: lives with their family Lives in: House/apartment Pt reports she is able to access and be mobilie within her home   OCCUPATION: Billing-computer work   PLOF: Independent   PATIENT GOALS: Pain relief    OBJECTIVE: (objective measures completed at initial evaluation unless otherwise dated) DIAGNOSTIC FINDINGS:  05/24/22 X-rays demonstrate moderate degenerative changes C5-6 C6-7 with  straightening of the cervical spine   PATIENT SURVEYS:  NDI 17/50= 34%  Moderate disability   POSTURE: rounded shoulders, forward head, and increased thoracic kyphosis, and CT step off   PALPATION: TTP peri-CT junction and paraspinal and upper trap areas        CERVICAL ROM:    Active ROM A/PROM (deg) eval AROM 08/16/22  09/04/2022  Flexion 43 no P  45  Extension 44 P ant  55  Right lateral flexion 45 pull L 50   Left lateral flexion 40 pull R 50   Right rotation 60 no P 70 70  Left rotation 50 np P 65 65  P=provocation  (Blank rows = not tested)   UPPER EXTREMITY ROM: WNLs and equal Active ROM Right eval Left eval  Shoulder flexion      Shoulder extension      Shoulder abduction      Shoulder adduction      Shoulder extension      Shoulder internal rotation      Shoulder external rotation      Elbow flexion      Elbow extension      Wrist flexion      Wrist extension      Wrist ulnar deviation      Wrist radial deviation      Wrist pronation      Wrist supination       (Blank rows =  not tested)   UPPER EXTREMITY MMT: Myotome screen negative MMT Right eval Left eval  Shoulder flexion      Shoulder extension      Shoulder abduction      Shoulder adduction      Shoulder extension      Shoulder internal rotation      Shoulder external rotation      Middle trapezius      Lower trapezius  Elbow flexion      Elbow extension      Wrist flexion      Wrist extension      Wrist ulnar deviation      Wrist radial deviation      Wrist pronation      Wrist supination      Grip strength 64,40 19, 19 capal tunnel sugery 11/*/23   (Blank rows = not tested)   CERVICAL SPECIAL TESTS:  Neck flexor muscle endurance test: Positive, Spurling's test: Negative, and Distraction test: Negative DNF=11"   FUNCTIONAL TESTS:  NT DNF 20 sec 09/11/22    TODAY'S TREATMENT: [facility] Adult PT Treatment:                                                DATE: 09/11/22 Therapeutic Exercise: UBE L2 x 2 minutes each  Shoulder rows green  Shoulder ext green  S/L open books  Supine DNF 10 sec x 3 , 20 sec  Supine star pattern blue  Supine scap retract with ER blue band Modalities: Estim: lower cervical, upper throracic: IFC 12 mA x 15 min concurrent with HMP    [facility] Adult PT Treatment:       DATE: 09/08/22 Therapeutic Exercise: Supine Cervical Retraction 10 reps - 5 hold Supine DNF Liftoffs 5 reps  8 hold Cervical Sidebending Stretch  2 reps - 15 hold each Cervical Rotation with Side bending 2 reps - 15 hold each Thoracic rotation cross arm stretch 2 reps - 15 sec hold  Standing open book x5 5sec Standing horizontal abduction star 5 x 2 Thoracic ext c soft foam roller c pec stretch Manual Therapy: STM to the lower cervical/ upper thoracic paraspinals and upper traps Skilled palpation to identify TrPs and taut muscle bands Trigger Point Dry Needling Treatment: Pre-treatment instruction: Patient instructed on dry needling rationale, procedures, and possible side effects including  pain during treatment (achy,cramping feeling), bruising, drop of blood, lightheadedness, nausea, sweating. Patient Consent Given: Yes Education handout provided: Yes Muscles treated: L and R upper traps and upper thoracic cervical paraspinals Needle size and number: .30x50m x 2 Electrical stimulation performed: No Parameters: N/A Treatment response/outcome: Twitch response elicited Post-treatment instructions: Patient instructed to expect possible mild to moderate muscle soreness later today and/or tomorrow. Patient instructed in methods to reduce muscle soreness and to continue prescribed HEP. If patient was dry needled over the lung field, patient was instructed on signs and symptoms of pneumothorax and, however unlikely, to see immediate medical attention should they occur. Patient was also educated on signs and symptoms of infection and to seek medical attention should they occur. Patient verbalized understanding of these instructions and education.   [facility]                                                [facility]: 09[facility]22 Therapeutic Exercise: UBE L1 x 4 min (fwd/bwd) Seated upper trap and levator scap stretch 2 x 20 sec each Doorway pec stretch 2 x 20 sec Sidelying thoracic rotation x 10 each Supine chin tuck 2 x 10 with 5 sec hold Supine horizontal abduction with green 2 x 20 Supine chin tuck with lift 10 x 5 sec Seated double  ER and scap retraction with green 2 x 20 Row with blue  2 x 20 Extension with green 2 x 20 Standing chin tuck at wall using ball behind neck 2 x 10   [facility] Adult PT Treatment:                                                DATE: 08/25/22 Therapeutic Exercise: Supine Cervical Retraction 5 reps - 5 hold Supine DNF Liftoffs 5 reps  8 hold Standing Cervical Retraction  5 reps 5 hold standing against wall Seated Cervical Sidebending Stretch  3 reps - 15 hold each Standing Cervical Retraction with Side bending 3 reps - 15 hold eac Cervical retraction  at wall x5 5"- postural feedback Standing Green band retraction with bilat ER 2x 15 Standing horizontal abduction star 5 x 2 +issued band  Manual Therapy: STM to the cervical paraspinals and upper traps Suboccipital release Skilled palpation to identify TrPs and taut muscle bands Trigger Point Dry Needling Treatment: Pre-treatment instruction: Patient instructed on dry needling rationale, procedures, and possible side effects including pain during treatment (achy,cramping feeling), bruising, drop of blood, lightheadedness, nausea, sweating. Patient Consent Given: Yes Education handout provided: Yes Muscles treated: L and R upper traps  and L suboccipitals and upper cervical paraspinals Needle size and number: .30x5m x 1; .30x344mx 2 Electrical stimulation performed: No Parameters: N/A Treatment response/outcome: Twitch response elicited Post-treatment instructions: Patient instructed to expect possible mild to moderate muscle soreness later today and/or tomorrow. Patient instructed in methods to reduce muscle soreness and to continue prescribed HEP. If patient was dry needled over the lung field, patient was instructed on signs and symptoms of pneumothorax and, however unlikely, to see immediate medical attention should they occur. Patient was also educated on signs and symptoms of infection and to seek medical attention should they occur. Patient verbalized understanding of these instructions and education.   [facility]                                                DATE: 08[facility]    PATIENT EDUCATION:  Education details: HEP update Person educated: Patient Education method: Explanation, Demonstration, Tactile cues, Verbal cues, and Handouts Education comprehension: verbalized understanding, returned demonstration, verbal cues required, and tactile cues required   HOME EXERCISE PROGRAM: Access Code: [email or web address]    ASSESSMENT:  CLINICAL IMPRESSION: Pt reports some  overall improvement since starting PT. She is having stabbing pains right upper thoracic near spine. Continued with postural stretching and strengthening. DNF improved to 20 sec.  Trial of Estim today with HMP. Afterward she reported reduced pain.  Pt tolerated PT today without adverse effects. Pt will continue to benefit from skilled PT to address impairments for improved function.   OBJECTIVE IMPAIRMENTS: decreased ROM, decreased strength, increased fascial restrictions, increased muscle spasms, postural dysfunction, and obesity.   ACTIVITY LIMITATIONS: carrying, lifting, reach over head, and caring for others   PARTICIPATION LIMITATIONS: meal prep, cleaning, laundry, driving, and occupation   PERSONAL FACTORS: Past/current experiences, Profession, Time since onset of injury/illness/exacerbation, and 1 comorbidity: carpal tunnel releases  are also affecting patient's functional outcome.      GOALS: SHORT TERM GOALS = LTGs   LONG  TERM GOALS: Target date: 09/22/22   Pt will be Ind in a final HEP to maintain achieved LOF Baseline: initiated Goal status: INITIAL   2.  Increase cervical side bending by 5d and rotation by 10d for improved neck function c less pain Baseline: see flow sheets Status: 08/16/22= see flow sheets Goal status: MET   3.  Increase DNF endurance to 30" for improved neck function c less pain Baseline: 11" 09/11/22: 20 sec  Goal status: ONGOING   4.  Pt will report improve quality of with pt able to sleep comfortably for 6 hours Baseline: 2 hours 09/11/22: 2 hours , wakes 4-5 times per night  Goal status: ONGOING   5.  Pt will report a decrease in neck pain to 4/10 or less with daily activities  Baseline: 3-9/10 Goal status: INITIAL   6.  Pt's NDI score will improve to 12/50, 24% Min disability or less as indication of improved function Baseline: 17/50 35% Mod disability Goal status: INITIAL     PLAN: PT FREQUENCY: 2x/week   PT DURATION: 6 weeks   PLANNED  INTERVENTIONS: Therapeutic exercises, Therapeutic activity, Patient/Family education, Self Care, Aquatic Therapy, Dry Needling, Electrical stimulation, Spinal manipulation, Spinal mobilization, Cryotherapy, Moist heat, Taping, Vasopneumatic device, Traction, Ultrasound, Ionotophoresis 18m/ml Dexamethasone, Manual therapy, and Re-evaluation   PLAN FOR NEXT SESSION: Assess response to HEP; progress therex as indicated; use of modalities, manual therapy; and TPDN as indicated. Assess response to Estim and give info if interested.    JHessie Diener PTA 09/11/22 3:35 PM Phone: [phone]: [phone]

## 2022-09-14 NOTE — Therapy (Signed)
OUTPATIENT PHYSICAL THERAPY TREATMENT NOTE   Patient Name: [name] MRN: [id number] DOB:11/16/1975, 47 y.o., female Today's Date: 09/15/2022  PCP: Minette Brine, FNP  REFERRING PROVIDER: Aundra Dubin, PA-C    END OF SESSION:   PT End of Session - 09/15/22 0854     Visit Number 10    Number of Visits 13    Date for PT Re-Evaluation 09/22/22    Authorization Type [place] MEDICAID [facility]    Authorization Time Period 14 visits    Authorization - Visit Number 9    Authorization - Number of Visits 14    Progress Note Due on Visit 10    PT Start Time 0847    PT Stop Time 0932    PT Time Calculation (min) 45 min    Activity Tolerance Patient tolerated treatment well    Behavior During Therapy [facility] for tasks assessed/performed                Past Medical History:  Diagnosis Date   Anxiety    Cancer ([place])    ovarian cancer   Carpal tunnel syndrome of right wrist 11/2013   Depression    Ganglion cyst of wrist 11/2013   right   Hypertension    is on 3 meds. - states BP is under control; has been on med. x 12 yr.   Migraines    Right tennis elbow 11/2013   Past Surgical History:  Procedure Laterality Date   ABDOMINAL HYSTERECTOMY     complete   CARPAL TUNNEL RELEASE Right 12/12/2013   Procedure: ENDOSCOPY RIGHT WRIST CARPAL TUNNEL RELEASE EXCISION GANGLION WRIST PRIMARY ;  Surgeon: Renette Butters, MD;  Location: [place];  Service: Orthopedics;  Laterality: Right;   CARPAL TUNNEL RELEASE Left 05/31/2022   Procedure: LEFT CARPAL TUNNEL RELEASE;  Surgeon: Leandrew Koyanagi, MD;  Location: [place];  Service: Orthopedics;  Laterality: Left;   CESAREAN SECTION  03/11/2005; 04/05/2008   CHOLECYSTECTOMY  02/11/2004   lap. chole.   LATERAL EPICONDYLE RELEASE Right 12/12/2013   Procedure: RIGHT TENNIS ELBOW RELEASE WITH BONE REPAIR;  Surgeon: Renette Butters, MD;  Location: [place];  Service: Orthopedics;  Laterality: Right;    LUMBAR LAMINECTOMY WITH COFLEX 1 LEVEL Bilateral 01/14/2015   Procedure: Laminectomy - bilateral - L4-L5 ;  Surgeon: Karie Chimera, MD;  Location: [place] [facility];  Service: Neurosurgery;  Laterality: Bilateral;  Laminectomy - bilateral - L4-L5    LUMBAR WOUND DEBRIDEMENT N/A 02/02/2015   Procedure: LUMBAR WOUND DEBRIDEMENT, WASHOUT, CLOSURE;  Surgeon: Consuella Lose, MD;  Location: [place] [facility];  Service: Neurosurgery;  Laterality: N/A;  lumbar wound debridement   TUBAL LIGATION  04/05/2008   Patient Active Problem List   Diagnosis Date Noted   Class 3 severe obesity due to excess calories with body mass index (BMI) of 45.0 to 49.9 in adult, unspecified whether serious comorbidity present ([place]) 09/04/2022   Chronic neck pain 09/04/2022   Coronary artery disease involving native coronary artery of native heart without angina pectoris 06/27/2022   Carpal tunnel syndrome on left 11/28/2021   Numbness and tingling in left hand 10/11/2021   Abdominal cramping 09/26/2021   Hormone replacement therapy 09/26/2021   Pelvic mass 04/11/2021   Teratoma, malignant ([place]) 09/21/2020   Premature atrial contractions 06/10/2020   PVC (premature ventricular contraction) 06/10/2020   Acanthosis nigricans, acquired 06/14/2018   Prediabetes 06/14/2018   GAD (generalized anxiety disorder) 08/30/2017  Hypoestrogenism 08/30/2017   Migraine without aura 08/10/2015   GERD without esophagitis 08/10/2015   Obesity due to excess calories with serious comorbidity 08/10/2015   Primary insomnia 08/10/2015   Spinal stenosis, lumbar 01/14/2015   Carpal tunnel syndrome 12/12/2013   HYPERLIPIDEMIA 12/05/2007   Essential hypertension 12/05/2007   BRADYCARDIA 12/05/2007   DEGENERATIVE JOINT DISEASE 12/05/2007    REFERRING DIAG: Cervicalgia   THERAPY DIAG:  Cervicalgia  Abnormal posture  Cramp and spasm  Rationale for Evaluation and Treatment Rehabilitation  ONSET DATE: Approx 1 year    SUBJECTIVE:                                        SUBJECTIVE STATEMENT: Pt reports she is still having pain lower cervical/upper thoracic pain/burning on right side> left. Pain increases with reaching or using both arms and feels like a stab. Pt reports she is sleeping better.  Are you having pain?  Yes: NPRS scale: 7/10 Pain location: Neck, left Pain description: Dull, burning Aggravating factors: Sleeping, hurts all the time Relieving factors: Stretching my neck back At night 8-9/10  PERTINENT HISTORY:  HTN; carpal tunnel release R 11/25/13, L 05/31/22; Lumbar laminectomy 01/14/15    PRECAUTIONS: None   WEIGHT BEARING RESTRICTIONS: No   FALLS:  Has patient fallen in last 6 months? No   LIVING ENVIRONMENT: Lives with: lives with their family Lives in: House/apartment Pt reports she is able to access and be mobilie within her home   OCCUPATION: Billing-computer work   PLOF: Independent   PATIENT GOALS: Pain relief    OBJECTIVE: (objective measures completed at initial evaluation unless otherwise dated) DIAGNOSTIC FINDINGS:  05/24/22 X-rays demonstrate moderate degenerative changes C5-6 C6-7 with  straightening of the cervical spine   PATIENT SURVEYS:  NDI 17/50= 34%  Moderate disability   POSTURE: rounded shoulders, forward head, and increased thoracic kyphosis, and CT step off   PALPATION: TTP peri-CT junction and paraspinal and upper trap areas        CERVICAL ROM:    Active ROM A/PROM (deg) eval AROM 08/16/22  09/04/2022  Flexion 43 no P  45  Extension 44 P ant  55  Right lateral flexion 45 pull L 50   Left lateral flexion 40 pull R 50   Right rotation 60 no P 70 70  Left rotation 50 np P 65 65  P=provocation  (Blank rows = not tested)   UPPER EXTREMITY ROM: WNLs and equal Active ROM Right eval Left eval  Shoulder flexion      Shoulder extension      Shoulder abduction      Shoulder adduction      Shoulder extension      Shoulder internal rotation      Shoulder  external rotation      Elbow flexion      Elbow extension      Wrist flexion      Wrist extension      Wrist ulnar deviation      Wrist radial deviation      Wrist pronation      Wrist supination       (Blank rows = not tested)   UPPER EXTREMITY MMT: Myotome screen negative MMT Right eval Left eval  Shoulder flexion      Shoulder extension      Shoulder abduction      Shoulder adduction  Shoulder extension      Shoulder internal rotation      Shoulder external rotation      Middle trapezius      Lower trapezius      Elbow flexion      Elbow extension      Wrist flexion      Wrist extension      Wrist ulnar deviation      Wrist radial deviation      Wrist pronation      Wrist supination      Grip strength 64,40 19, 19 capal tunnel sugery 11/*/23   (Blank rows = not tested)   CERVICAL SPECIAL TESTS:  Neck flexor muscle endurance test: Positive, Spurling's test: Negative, and Distraction test: Negative DNF=11"   FUNCTIONAL TESTS:  NT DNF 20 sec 09/11/22    TODAY'S TREATMENT: [facility] Adult PT Treatment:                                                DATE: 09/15/22 Therapeutic Exercise: Seated cross arm thoracic stretch x2 20' Cat/camel x5  5" Qped arm reach through x3 15" each Open book x5 5" Thoracic ext c soft foam roller c pec stretch Standing shoulder hor star pattern 3x5 patterns GTB Manual Therapy: STM/DTM to the upper thoracic paraspinals Skilled palpation to identify TrPs and taut muscle bands Trigger Point Dry Needling Treatment: Pre-treatment instruction: Patient instructed on dry needling rationale, procedures, and possible side effects including pain during treatment (achy,cramping feeling), bruising, drop of blood, lightheadedness, nausea, sweating. Patient Consent Given: Yes Education handout provided: Yes Muscles treated: L and R upper thoracic paraspinals T1-T4 Needle size and number: .30x53m x 2 Electrical stimulation performed:  No Parameters: N/A Treatment response/outcome: Twitch response elicited Post-treatment instructions: Patient instructed to expect possible mild to moderate muscle soreness later today and/or tomorrow. Patient instructed in methods to reduce muscle soreness and to continue prescribed HEP. If patient was dry needled over the lung field, patient was instructed on signs and symptoms of pneumothorax and, however unlikely, to see immediate medical attention should they occur. Patient was also educated on signs and symptoms of infection and to seek medical attention should they occur. Patient verbalized understanding of these instructions and education.   [facility]                                                DATE: 09/11/22 Therapeutic Exercise: UBE L2 x 2 minutes each  Shoulder rows green  Shoulder ext green  S/L open books  Supine DNF 10 sec x 3 , 20 sec  Supine star pattern blue  Supine scap retract with ER blue band Modalities: Estim: lower cervical, upper throracic: IFC 12 mA x 15 min concurrent with HMP    [facility] Adult PT Treatment:       DATE: 09/08/22 Therapeutic Exercise: Supine Cervical Retraction 10 reps - 5 hold Supine DNF Liftoffs 5 reps  8 hold Cervical Sidebending Stretch  2 reps - 15 hold each Cervical Rotation with Side bending 2 reps - 15 hold each Thoracic rotation cross arm stretch 2 reps - 15 sec hold  Standing open book x5 5sec Standing horizontal abduction star 5 x 2 Thoracic  ext c soft foam roller c pec stretch Manual Therapy: STM to the lower cervical/ upper thoracic paraspinals and upper traps Skilled palpation to identify TrPs and taut muscle bands Trigger Point Dry Needling Treatment: Pre-treatment instruction: Patient instructed on dry needling rationale, procedures, and possible side effects including pain during treatment (achy,cramping feeling), bruising, drop of blood, lightheadedness, nausea, sweating. Patient Consent Given: Yes Education  handout provided: Yes Muscles treated: L and R upper traps and upper thoracic cervical paraspinals Needle size and number: .30x32m x 2 Electrical stimulation performed: No Parameters: N/A Treatment response/outcome: Twitch response elicited Post-treatment instructions: Patient instructed to expect possible mild to moderate muscle soreness later today and/or tomorrow. Patient instructed in methods to reduce muscle soreness and to continue prescribed HEP. If patient was dry needled over the lung field, patient was instructed on signs and symptoms of pneumothorax and, however unlikely, to see immediate medical attention should they occur. Patient was also educated on signs and symptoms of infection and to seek medical attention should they occur. Patient verbalized understanding of these instructions and education.   [facility]                                                [facility]: 09/[facility]/22 Therapeutic Exercise: UBE L1 x 4 min (fwd/bwd) Seated upper trap and levator scap stretch 2 x 20 sec each Doorway pec stretch 2 x 20 sec Sidelying thoracic rotation x 10 each Supine chin tuck 2 x 10 with 5 sec hold Supine horizontal abduction with green 2 x 20 Supine chin tuck with lift 10 x 5 sec Seated double ER and scap retraction with green 2 x 20 Row with blue  2 x 20 Extension with green 2 x 20 Standing chin tuck at wall using ball behind neck 2 x 10  PATIENT EDUCATION:  Education details: HEP update Person educated: Patient Education method: Explanation, Demonstration, Tactile cues, Verbal cues, and Handouts Education comprehension: verbalized understanding, returned demonstration, verbal cues required, and tactile cues required   HOME EXERCISE PROGRAM: Access Code: [email or web address]    ASSESSMENT:  CLINICAL IMPRESSION: Pt reports she is sleeping better. Episodes of sharp/stabbing upper thoracic pain continues. Following manual therapy TPDN was completed to the L and R T1-T4 paraspinals.  Pt reported significant discomfort with the TPDN this session. Pt then completed thoracic flexibility and strengthening therex. At the end of the session, pt reported the discomfort from the TPDN was improved. Will continue to address pt's thoracic pain. Pt will continue to benefit from skilled PT to address impairments for improved function with less pain.  OBJECTIVE IMPAIRMENTS: decreased ROM, decreased strength, increased fascial restrictions, increased muscle spasms, postural dysfunction, and obesity.   ACTIVITY LIMITATIONS: carrying, lifting, reach over head, and caring for others   PARTICIPATION LIMITATIONS: meal prep, cleaning, laundry, driving, and occupation   PERSONAL FACTORS: Past/current experiences, Profession, Time since onset of injury/illness/exacerbation, and 1 comorbidity: carpal tunnel releases  are also affecting patient's functional outcome.      GOALS: SHORT TERM GOALS = LTGs   LONG TERM GOALS: Target date: 09/22/22   Pt will be Ind in a final HEP to maintain achieved LOF Baseline: initiated Goal status: INITIAL   2.  Increase cervical side bending by 5d and rotation by 10d for improved neck function c less pain Baseline: see flow sheets Status: 08/16/22= see flow  sheets Goal status: MET   3.  Increase DNF endurance to 30" for improved neck function c less pain Baseline: 11" 09/11/22: 20 sec  Goal status: ONGOING   4.  Pt will report improve quality of with pt able to sleep comfortably for 6 hours Baseline: 2 hours 09/11/22: 2 hours , wakes 4-5 times per night  Goal status: ONGOING   5.  Pt will report a decrease in neck pain to 4/10 or less with daily activities  Baseline: 3-9/10 Goal status: INITIAL   6.  Pt's NDI score will improve to 12/50, 24% Min disability or less as indication of improved function Baseline: 17/50 35% Mod disability Goal status: INITIAL     PLAN: PT FREQUENCY: 2x/week   PT DURATION: 6 weeks   PLANNED INTERVENTIONS: Therapeutic  exercises, Therapeutic activity, Patient/Family education, Self Care, Aquatic Therapy, Dry Needling, Electrical stimulation, Spinal manipulation, Spinal mobilization, Cryotherapy, Moist heat, Taping, Vasopneumatic device, Traction, Ultrasound, Ionotophoresis '4mg'$ /ml Dexamethasone, Manual therapy, and Re-evaluation   PLAN FOR NEXT SESSION: Assess response to HEP; progress therex as indicated; use of modalities, manual therapy; and TPDN as indicated. Assess response to Estim and give info if interested.    Rhemi Balbach MS, PT 09/15/22 1:17 PM

## 2022-09-15 ENCOUNTER — Ambulatory Visit: Payer: Medicaid Other

## 2022-09-15 DIAGNOSIS — M542 Cervicalgia: Secondary | ICD-10-CM | POA: Diagnosis not present

## 2022-09-15 DIAGNOSIS — R252 Cramp and spasm: Secondary | ICD-10-CM

## 2022-09-15 DIAGNOSIS — R293 Abnormal posture: Secondary | ICD-10-CM

## 2022-09-18 ENCOUNTER — Ambulatory Visit: Payer: Medicaid Other | Admitting: Physical Therapy

## 2022-09-18 ENCOUNTER — Encounter: Payer: Self-pay | Admitting: Physical Therapy

## 2022-09-18 DIAGNOSIS — M542 Cervicalgia: Secondary | ICD-10-CM

## 2022-09-18 DIAGNOSIS — R252 Cramp and spasm: Secondary | ICD-10-CM

## 2022-09-18 DIAGNOSIS — R293 Abnormal posture: Secondary | ICD-10-CM

## 2022-09-18 NOTE — Therapy (Signed)
OUTPATIENT PHYSICAL THERAPY TREATMENT NOTE   Patient Name: [name] MRN: [id number] DOB:May 09, 1976, 47 y.o., female Today's Date: 09/18/2022  PCP: Minette Brine, FNP  REFERRING PROVIDER: Aundra Dubin, PA-C    END OF SESSION:   PT End of Session - 09/18/22 1450     Visit Number 11    Number of Visits 13    Date for PT Re-Evaluation 09/22/22    Authorization Type [place] MEDICAID [facility]    Authorization Time Period 14 visits    Authorization - Visit Number 10    Authorization - Number of Visits 14    Progress Note Due on Visit 10    PT Start Time 0248    PT Stop Time 0335    PT Time Calculation (min) 47 min                Past Medical History:  Diagnosis Date   Anxiety    Cancer ([place])    ovarian cancer   Carpal tunnel syndrome of right wrist 11/2013   Depression    Ganglion cyst of wrist 11/2013   right   Hypertension    is on 3 meds. - states BP is under control; has been on med. x 12 yr.   Migraines    Right tennis elbow 11/2013   Past Surgical History:  Procedure Laterality Date   ABDOMINAL HYSTERECTOMY     complete   CARPAL TUNNEL RELEASE Right 12/12/2013   Procedure: ENDOSCOPY RIGHT WRIST CARPAL TUNNEL RELEASE EXCISION GANGLION WRIST PRIMARY ;  Surgeon: Renette Butters, MD;  Location: [facility];  Service: Orthopedics;  Laterality: Right;   CARPAL TUNNEL RELEASE Left 05/31/2022   Procedure: LEFT CARPAL TUNNEL RELEASE;  Surgeon: Leandrew Koyanagi, MD;  Location: [facility];  Service: Orthopedics;  Laterality: Left;   CESAREAN SECTION  03/11/2005; 04/05/2008   CHOLECYSTECTOMY  02/11/2004   lap. chole.   LATERAL EPICONDYLE RELEASE Right 12/12/2013   Procedure: RIGHT TENNIS ELBOW RELEASE WITH BONE REPAIR;  Surgeon: Renette Butters, MD;  Location: [facility];  Service: Orthopedics;  Laterality: Right;   LUMBAR LAMINECTOMY WITH COFLEX 1 LEVEL Bilateral 01/14/2015   Procedure: Laminectomy - bilateral - L4-L5 ;   Surgeon: Karie Chimera, MD;  Location: [facility];  Service: Neurosurgery;  Laterality: Bilateral;  Laminectomy - bilateral - L4-L5    LUMBAR WOUND DEBRIDEMENT N/A 02/02/2015   Procedure: LUMBAR WOUND DEBRIDEMENT, WASHOUT, CLOSURE;  Surgeon: Consuella Lose, MD;  Location: [facility];  Service: Neurosurgery;  Laterality: N/A;  lumbar wound debridement   TUBAL LIGATION  04/05/2008   Patient Active Problem List   Diagnosis Date Noted   Class 3 severe obesity due to excess calories with body mass index (BMI) of 45.0 to 49.9 in adult, unspecified whether serious comorbidity present ([place]) 09/04/2022   Chronic neck pain 09/04/2022   Coronary artery disease involving native coronary artery of native heart without angina pectoris 06/27/2022   Carpal tunnel syndrome on left 11/28/2021   Numbness and tingling in left hand 10/11/2021   Abdominal cramping 09/26/2021   Hormone replacement therapy 09/26/2021   Pelvic mass 04/11/2021   Teratoma, malignant ([place]) 09/21/2020   Premature atrial contractions 06/10/2020   PVC (premature ventricular contraction) 06/10/2020   Acanthosis nigricans, acquired 06/14/2018   Prediabetes 06/14/2018   GAD (generalized anxiety disorder) 08/30/2017   Hypoestrogenism 08/30/2017   Migraine without aura 08/10/2015   GERD without esophagitis 08/10/2015   Obesity due to  excess calories with serious comorbidity 08/10/2015   Primary insomnia 08/10/2015   Spinal stenosis, lumbar 01/14/2015   Carpal tunnel syndrome 12/12/2013   HYPERLIPIDEMIA 12/05/2007   Essential hypertension 12/05/2007   BRADYCARDIA 12/05/2007   DEGENERATIVE JOINT DISEASE 12/05/2007    REFERRING DIAG: Cervicalgia   THERAPY DIAG:  Cervicalgia  Abnormal posture  Cramp and spasm  Rationale for Evaluation and Treatment Rehabilitation  ONSET DATE: Approx 1 year    SUBJECTIVE:                                       SUBJECTIVE STATEMENT: Pain increased over the weekend after dry needling and  is calming back down.   Are you having pain?  Yes: NPRS scale: 6/10 Pain location: Neck, left Pain description: Dull, burning Aggravating factors: Sleeping, hurts all the time Relieving factors: Stretching my neck back At night 8-9/10  PERTINENT HISTORY:  HTN; carpal tunnel release R 11/25/13, L 05/31/22; Lumbar laminectomy 01/14/15    PRECAUTIONS: None   WEIGHT BEARING RESTRICTIONS: No   FALLS:  Has patient fallen in last 6 months? No   LIVING ENVIRONMENT: Lives with: lives with their family Lives in: House/apartment Pt reports she is able to access and be mobilie within her home   OCCUPATION: Billing-computer work   PLOF: [employer]   PATIENT GOALS: Pain relief    OBJECTIVE: (objective measures completed at initial evaluation unless otherwise dated) DIAGNOSTIC FINDINGS:  05/24/22 X-rays demonstrate moderate degenerative changes C5-6 C6-7 with  straightening of the cervical spine   PATIENT SURVEYS:  NDI 17/50= 34%  Moderate disability   POSTURE: rounded shoulders, forward head, and increased thoracic kyphosis, and CT step off   PALPATION: TTP peri-CT junction and paraspinal and upper trap areas        CERVICAL ROM:    Active ROM A/PROM (deg) eval AROM 08/16/22  09/04/2022  Flexion 43 no P  45  Extension 44 P ant  55  Right lateral flexion 45 pull L 50   Left lateral flexion 40 pull R 50   Right rotation 60 no P 70 70  Left rotation 50 np P 65 65  P=provocation  (Blank rows = not tested)   UPPER EXTREMITY ROM: WNLs and equal Active ROM Right eval Left eval  Shoulder flexion      Shoulder extension      Shoulder abduction      Shoulder adduction      Shoulder extension      Shoulder internal rotation      Shoulder external rotation      Elbow flexion      Elbow extension      Wrist flexion      Wrist extension      Wrist ulnar deviation      Wrist radial deviation      Wrist pronation      Wrist supination       (Blank rows = not tested)    UPPER EXTREMITY MMT: Myotome screen negative MMT Right eval Left eval  Shoulder flexion      Shoulder extension      Shoulder abduction      Shoulder adduction      Shoulder extension      Shoulder internal rotation      Shoulder external rotation      Middle trapezius      Lower trapezius  Elbow flexion      Elbow extension      Wrist flexion      Wrist extension      Wrist ulnar deviation      Wrist radial deviation      Wrist pronation      Wrist supination      Grip strength 64,40 19, 19 capal tunnel sugery 11/*/23   (Blank rows = not tested)   CERVICAL SPECIAL TESTS:  Neck flexor muscle endurance test: Positive, Spurling's test: Negative, and Distraction test: Negative DNF=11"   FUNCTIONAL TESTS:  NT DNF 20 sec 09/11/22    TODAY'S TREATMENT: [facility] Adult PT Treatment:                                                DATE: 09/18/22 Therapeutic Exercise: UBE L2 3 min each way  Blue band rows x 20 Shoulder ext green x 20 Green band ER bilat with scap retract Doorway stretch Supine lying over rolled yoga mat - arms overhead  Open books each way   Modalities: Estim: lower cervical, upper throracic: IFC 21 mA x 15 min concurrent with HMP   [facility] Adult PT Treatment:                                                DATE: 09/15/22 Therapeutic Exercise: Seated cross arm thoracic stretch x2 20' Cat/camel x5  5" Qped arm reach through x3 15" each Open book x5 5" Thoracic ext c soft foam roller c pec stretch Standing shoulder hor star pattern 3x5 patterns GTB Manual Therapy: STM/DTM to the upper thoracic paraspinals Skilled palpation to identify TrPs and taut muscle bands Trigger Point Dry Needling Treatment: Pre-treatment instruction: Patient instructed on dry needling rationale, procedures, and possible side effects including pain during treatment (achy,cramping feeling), bruising, drop of blood, lightheadedness, nausea, sweating. Patient Consent Given:  Yes Education handout provided: Yes Muscles treated: L and R upper thoracic paraspinals T1-T4 Needle size and number: .30x51m x 2 Electrical stimulation performed: No Parameters: N/A Treatment response/outcome: Twitch response elicited Post-treatment instructions: Patient instructed to expect possible mild to moderate muscle soreness later today and/or tomorrow. Patient instructed in methods to reduce muscle soreness and to continue prescribed HEP. If patient was dry needled over the lung field, patient was instructed on signs and symptoms of pneumothorax and, however unlikely, to see immediate medical attention should they occur. Patient was also educated on signs and symptoms of infection and to seek medical attention should they occur. Patient verbalized understanding of these instructions and education.   [facility]                                                [facility]: 09[facility]22 Therapeutic Exercise: UBE L2 x 2 minutes each  Shoulder rows green  Shoulder ext green  S/L open books  Supine DNF 10 sec x 3 , 20 sec  Supine star pattern blue  Supine scap retract with ER blue band Modalities: Estim: lower cervical, upper throracic: IFC 12 mA x 15 min concurrent with HMP  [facility] Adult PT Treatment:       DATE: 09/08/22 Therapeutic Exercise: Supine Cervical Retraction 10 reps - 5 hold Supine DNF Liftoffs 5 reps  8 hold Cervical Sidebending Stretch  2 reps - 15 hold each Cervical Rotation with Side bending 2 reps - 15 hold each Thoracic rotation cross arm stretch 2 reps - 15 sec hold  Standing open book x5 5sec Standing horizontal abduction star 5 x 2 Thoracic ext c soft foam roller c pec stretch Manual Therapy: STM to the lower cervical/ upper thoracic paraspinals and upper traps Skilled palpation to identify TrPs and taut muscle bands Trigger Point Dry Needling Treatment: Pre-treatment instruction: Patient instructed on dry needling rationale, procedures, and possible  side effects including pain during treatment (achy,cramping feeling), bruising, drop of blood, lightheadedness, nausea, sweating. Patient Consent Given: Yes Education handout provided: Yes Muscles treated: L and R upper traps and upper thoracic cervical paraspinals Needle size and number: .30x99m x 2 Electrical stimulation performed: No Parameters: N/A Treatment response/outcome: Twitch response elicited Post-treatment instructions: Patient instructed to expect possible mild to moderate muscle soreness later today and/or tomorrow. Patient instructed in methods to reduce muscle soreness and to continue prescribed HEP. If patient was dry needled over the lung field, patient was instructed on signs and symptoms of pneumothorax and, however unlikely, to see immediate medical attention should they occur. Patient was also educated on signs and symptoms of infection and to seek medical attention should they occur. Patient verbalized understanding of these instructions and education.   [facility]                                                [facility]: 09/04/22 Therapeutic Exercise: UBE L1 x 4 min (fwd/bwd) Seated upper trap and levator scap stretch 2 x 20 sec each Doorway pec stretch 2 x 20 sec Sidelying thoracic rotation x 10 each Supine chin tuck 2 x 10 with 5 sec hold Supine horizontal abduction with green 2 x 20 Supine chin tuck with lift 10 x 5 sec Seated double ER and scap retraction with green 2 x 20 Row with blue  2 x 20 Extension with green 2 x 20 Standing chin tuck at wall using ball behind neck 2 x 10  PATIENT EDUCATION:  Education details: HEP update Person educated: Patient Education method: Explanation, Demonstration, Tactile cues, Verbal cues, and Handouts Education comprehension: verbalized understanding, returned demonstration, verbal cues required, and tactile cues required   HOME EXERCISE PROGRAM: Access Code: [email or web address]    ASSESSMENT:  CLINICAL IMPRESSION: Pt  reports increased pain after TPDN last visit. Pain is starting to calm down now. Continued thoracic and trunk mobility followed by IFC and heat.  Pt will continue to benefit from skilled PT to address impairments for improved function with less pain.  OBJECTIVE IMPAIRMENTS: decreased ROM, decreased strength, increased fascial restrictions, increased muscle spasms, postural dysfunction, and obesity.   ACTIVITY LIMITATIONS: carrying, lifting, reach over head, and caring for others   PARTICIPATION LIMITATIONS: meal prep, cleaning, laundry, driving, and occupation   PERSONAL FACTORS: Past/current experiences, Profession, Time since onset of injury/illness/exacerbation, and 1 comorbidity: carpal tunnel releases  are also affecting patient's functional outcome.      GOALS: SHORT TERM GOALS = LTGs   LONG TERM GOALS: Target date: 09/22/22   Pt will be Ind in a final HEP to  maintain achieved LOF Baseline: initiated Goal status: INITIAL   2.  Increase cervical side bending by 5d and rotation by 10d for improved neck function c less pain Baseline: see flow sheets Status: 08/16/22= see flow sheets Goal status: MET   3.  Increase DNF endurance to 30" for improved neck function c less pain Baseline: 11" 09/11/22: 20 sec  Goal status: ONGOING   4.  Pt will report improve quality of with pt able to sleep comfortably for 6 hours Baseline: 2 hours 09/11/22: 2 hours , wakes 4-5 times per night  Goal status: ONGOING   5.  Pt will report a decrease in neck pain to 4/10 or less with daily activities  Baseline: 3-9/10 Goal status: INITIAL   6.  Pt's NDI score will improve to 12/50, 24% Min disability or less as indication of improved function Baseline: 17/50 35% Mod disability Goal status: INITIAL     PLAN: PT FREQUENCY: 2x/week   PT DURATION: 6 weeks   PLANNED INTERVENTIONS: Therapeutic exercises, Therapeutic activity, Patient/Family education, Self Care, Aquatic Therapy, Dry Needling,  Electrical stimulation, Spinal manipulation, Spinal mobilization, Cryotherapy, Moist heat, Taping, Vasopneumatic device, Traction, Ultrasound, Ionotophoresis '4mg'$ /ml Dexamethasone, Manual therapy, and Re-evaluation   PLAN FOR NEXT SESSION: Assess response to HEP; progress therex as indicated; use of modalities, manual therapy; and TPDN as indicated. Assess response to Estim and give info if interested.    Hessie Diener, PTA 09/18/22 3:24 PM Phone: [phone] Fax: [phone]

## 2022-09-20 NOTE — Therapy (Signed)
OUTPATIENT PHYSICAL THERAPY TREATMENT NOTE/Re-Auth   Patient Name: [name] MRN: [id number] DOB:26-Feb-1976, 47 y.o., female Today's Date: 09/22/2022  PCP: Minette Brine, FNP  REFERRING PROVIDER: Aundra Dubin, PA-C    END OF SESSION:   PT End of Session - 09/21/22 1513     Visit Number 12    Number of Visits 15    Date for PT Re-Evaluation 10/07/22    Authorization Type [place] MEDICAID [facility]    Authorization Time Period Approved 14 PT visits from 08/11/22-10/10/22    Authorization - Visit Number 11    Authorization - Number of Visits 14    Progress Note Due on Visit 10    PT Start Time 1508    PT Stop Time 1548    PT Time Calculation (min) 40 min    Activity Tolerance Patient tolerated treatment well    Behavior During Therapy [facility] for tasks assessed/performed                 Past Medical History:  Diagnosis Date   Anxiety    Cancer ([place])    ovarian cancer   Carpal tunnel syndrome of right wrist 11/2013   Depression    Ganglion cyst of wrist 11/2013   right   Hypertension    is on 3 meds. - states BP is under control; has been on med. x 12 yr.   Migraines    Right tennis elbow 11/2013   Past Surgical History:  Procedure Laterality Date   ABDOMINAL HYSTERECTOMY     complete   CARPAL TUNNEL RELEASE Right 12/12/2013   Procedure: ENDOSCOPY RIGHT WRIST CARPAL TUNNEL RELEASE EXCISION GANGLION WRIST PRIMARY ;  Surgeon: Renette Butters, MD;  Location: [place];  Service: Orthopedics;  Laterality: Right;   CARPAL TUNNEL RELEASE Left 05/31/2022   Procedure: LEFT CARPAL TUNNEL RELEASE;  Surgeon: Leandrew Koyanagi, MD;  Location: [place];  Service: Orthopedics;  Laterality: Left;   CESAREAN SECTION  03/11/2005; 04/05/2008   CHOLECYSTECTOMY  02/11/2004   lap. chole.   LATERAL EPICONDYLE RELEASE Right 12/12/2013   Procedure: RIGHT TENNIS ELBOW RELEASE WITH BONE REPAIR;  Surgeon: Renette Butters, MD;  Location: [place];   Service: Orthopedics;  Laterality: Right;   LUMBAR LAMINECTOMY WITH COFLEX 1 LEVEL Bilateral 01/14/2015   Procedure: Laminectomy - bilateral - L4-L5 ;  Surgeon: Karie Chimera, MD;  Location: [place] [facility];  Service: Neurosurgery;  Laterality: Bilateral;  Laminectomy - bilateral - L4-L5    LUMBAR WOUND DEBRIDEMENT N/A 02/02/2015   Procedure: LUMBAR WOUND DEBRIDEMENT, WASHOUT, CLOSURE;  Surgeon: Consuella Lose, MD;  Location: [place] [facility];  Service: Neurosurgery;  Laterality: N/A;  lumbar wound debridement   TUBAL LIGATION  04/05/2008   Patient Active Problem List   Diagnosis Date Noted   Class 3 severe obesity due to excess calories with body mass index (BMI) of 45.0 to 49.9 in adult, unspecified whether serious comorbidity present ([place]) 09/04/2022   Chronic neck pain 09/04/2022   Coronary artery disease involving native coronary artery of native heart without angina pectoris 06/27/2022   Carpal tunnel syndrome on left 11/28/2021   Numbness and tingling in left hand 10/11/2021   Abdominal cramping 09/26/2021   Hormone replacement therapy 09/26/2021   Pelvic mass 04/11/2021   Teratoma, malignant ([place]) 09/21/2020   Premature atrial contractions 06/10/2020   PVC (premature ventricular contraction) 06/10/2020   Acanthosis nigricans, acquired 06/14/2018   Prediabetes 06/14/2018   GAD (generalized  anxiety disorder) 08/30/2017   Hypoestrogenism 08/30/2017   Migraine without aura 08/10/2015   GERD without esophagitis 08/10/2015   Obesity due to excess calories with serious comorbidity 08/10/2015   Primary insomnia 08/10/2015   Spinal stenosis, lumbar 01/14/2015   Carpal tunnel syndrome 12/12/2013   HYPERLIPIDEMIA 12/05/2007   Essential hypertension 12/05/2007   BRADYCARDIA 12/05/2007   DEGENERATIVE JOINT DISEASE 12/05/2007    REFERRING DIAG: Cervicalgia   THERAPY DIAG:  Cervicalgia  Abnormal posture  Cramp and spasm  Rationale for Evaluation and Treatment Rehabilitation  ONSET  DATE: Approx 1 year    SUBJECTIVE:                                       SUBJECTIVE STATEMENT: My pain becomes worse after I get home from work and start cooking and home activities.  Are you having pain?  Yes: NPRS scale: 6/10 Pain location: Neck, left Pain description: Dull, burning Aggravating factors: Sleeping, hurts all the time Relieving factors: Stretching my neck back At night 8-9/10  PERTINENT HISTORY:  HTN; carpal tunnel release R 11/25/13, L 05/31/22; Lumbar laminectomy 01/14/15    PRECAUTIONS: None   WEIGHT BEARING RESTRICTIONS: No   FALLS:  Has patient fallen in last 6 months? No   LIVING ENVIRONMENT: Lives with: lives with their family Lives in: House/apartment Pt reports she is able to access and be mobilie within her home   OCCUPATION: Billing-computer work   PLOF: Independent   PATIENT GOALS: Pain relief    OBJECTIVE: (objective measures completed at initial evaluation unless otherwise dated) DIAGNOSTIC FINDINGS:  05/24/22 X-rays demonstrate moderate degenerative changes C5-6 C6-7 with  straightening of the cervical spine   PATIENT SURVEYS:  NDI 17/50= 34%  Moderate disability   POSTURE: rounded shoulders, forward head, and increased thoracic kyphosis, and CT step off   PALPATION: TTP peri-CT junction and paraspinal and upper trap areas        CERVICAL ROM:    Active ROM A/PROM (deg) eval AROM 08/16/22  09/04/2022  Flexion 43 no P  45  Extension 44 P ant  55  Right lateral flexion 45 pull L 50   Left lateral flexion 40 pull R 50   Right rotation 60 no P 70 70  Left rotation 50 np P 65 65  P=provocation  (Blank rows = not tested)   UPPER EXTREMITY ROM: WNLs and equal Active ROM Right eval Left eval  Shoulder flexion      Shoulder extension      Shoulder abduction      Shoulder adduction      Shoulder extension      Shoulder internal rotation      Shoulder external rotation      Elbow flexion      Elbow extension      Wrist  flexion      Wrist extension      Wrist ulnar deviation      Wrist radial deviation      Wrist pronation      Wrist supination       (Blank rows = not tested)   UPPER EXTREMITY MMT: Myotome screen negative MMT Right eval Left eval  Shoulder flexion      Shoulder extension      Shoulder abduction      Shoulder adduction      Shoulder extension      Shoulder internal  rotation      Shoulder external rotation      Middle trapezius      Lower trapezius      Elbow flexion      Elbow extension      Wrist flexion      Wrist extension      Wrist ulnar deviation      Wrist radial deviation      Wrist pronation      Wrist supination      Grip strength 64,40 19, 19 capal tunnel sugery 11/*/23   (Blank rows = not tested)   CERVICAL SPECIAL TESTS:  Neck flexor muscle endurance test: Positive, Spurling's test: Negative, and Distraction test: Negative DNF=11"   FUNCTIONAL TESTS:  NT DNF 20 sec 09/11/22    TODAY'S TREATMENT: [facility] Adult PT Treatment:                                                DATE: 09/21/22 Therapeutic Exercise: Supine cervical retractions x10 3" Supine DNF x10 5" Prone Y, H, I's x15 each Standing cervical retraction with wall feedback Contract/relax stretching x2 15" each Manual Therapy: STM/DTM to the cervical and upper thoracic paraspinals Manual cervical traction Suboccipital release Contract relax stretching for cervical side bending  [facility] Adult PT Treatment:                                                DATE: 09/18/22 Therapeutic Exercise: UBE L2 3 min each way  Blue band rows x 20 Shoulder ext green x 20 Green band ER bilat with scap retract Doorway stretch Supine lying over rolled yoga mat - arms overhead  Open books each way   Modalities: Estim: lower cervical, upper throracic: IFC 21 mA x 15 min concurrent with HMP   [facility] Adult PT Treatment:                                                DATE: 09/15/22 Therapeutic Exercise: Seated  cross arm thoracic stretch x2 20' Cat/camel x5  5" Qped arm reach through x3 15" each Open book x5 5" Thoracic ext c soft foam roller c pec stretch Standing shoulder hor star pattern 3x5 patterns GTB Manual Therapy: STM/DTM to the upper thoracic paraspinals Skilled palpation to identify TrPs and taut muscle bands Trigger Point Dry Needling Treatment: Pre-treatment instruction: Patient instructed on dry needling rationale, procedures, and possible side effects including pain during treatment (achy,cramping feeling), bruising, drop of blood, lightheadedness, nausea, sweating. Patient Consent Given: Yes Education handout provided: Yes Muscles treated: L and R upper thoracic paraspinals T1-T4 Needle size and number: .30x55m x 2 Electrical stimulation performed: No Parameters: N/A Treatment response/outcome: Twitch response elicited Post-treatment instructions: Patient instructed to expect possible mild to moderate muscle soreness later today and/or tomorrow. Patient instructed in methods to reduce muscle soreness and to continue prescribed HEP. If patient was dry needled over the lung field, patient was instructed on signs and symptoms of pneumothorax and, however unlikely, to see immediate medical attention should they occur. Patient was also educated on signs and symptoms of  infection and to seek medical attention should they occur. Patient verbalized understanding of these instructions and education.    PATIENT EDUCATION:  Education details: HEP update Person educated: Patient Education method: Explanation, Demonstration, Tactile cues, Verbal cues, and Handouts Education comprehension: verbalized understanding, returned demonstration, verbal cues required, and tactile cues required   HOME EXERCISE PROGRAM: Access Code: [email or web address]    ASSESSMENT:  CLINICAL IMPRESSION: Pt received manual therapy as above to address taut muscles and decreased cervical ROM. PT then focused on cervical  postural and and posterior chain strengthening. Pt's cervical mobility is improving. Pt's reports indicate some progress with pain, but it continues to be an issue. Pt reports the most consistent relief c retractions for postural strengthening. Pt will continue to benefit from skilled PT to address impairments for improved function with less pain.  OBJECTIVE IMPAIRMENTS: decreased ROM, decreased strength, increased fascial restrictions, increased muscle spasms, postural dysfunction, and obesity.   ACTIVITY LIMITATIONS: carrying, lifting, reach over head, and caring for others   PARTICIPATION LIMITATIONS: meal prep, cleaning, laundry, driving, and occupation   PERSONAL FACTORS: Past/current experiences, Profession, Time since onset of injury/illness/exacerbation, and 1 comorbidity: carpal tunnel releases  are also affecting patient's functional outcome.      GOALS: SHORT TERM GOALS = LTGs   LONG TERM GOALS: Target date: 10/07/22   Pt will be Ind in a final HEP to maintain achieved LOF Baseline: initiated Goal status: Ongoing   2.  Increase cervical side bending by 5d and rotation by 10d for improved neck function c less pain Baseline: see flow sheets Status: 08/16/22= see flow sheets Goal status: MET   3.  Increase DNF endurance to 30" for improved neck function c less pain Baseline: 11" 09/11/22: 20 sec  Goal status: ONGOING   4.  Pt will report improve quality of with pt able to sleep comfortably for 6 hours Baseline: 2 hours 09/11/22: 2 hours , wakes 4-5 times per night  Goal status: ONGOING   5.  Pt will report a decrease in neck pain to 4/10 or less with daily activities  Baseline: 3-9/10 Goal status: INITIAL   6.  Pt's NDI score will improve to 12/50, 24% Min disability or less as indication of improved function Baseline: 17/50 35% Mod disability Goal status: INITIAL     PLAN: PT FREQUENCY: 1-2x/week   PT DURATION: 2 weeks   PLANNED INTERVENTIONS: Therapeutic  exercises, Therapeutic activity, Patient/Family education, Self Care, Aquatic Therapy, Dry Needling, Electrical stimulation, Spinal manipulation, Spinal mobilization, Cryotherapy, Moist heat, Taping, Vasopneumatic device, Traction, Ultrasound, Ionotophoresis '4mg'$ /ml Dexamethasone, Manual therapy, and Re-evaluation   PLAN FOR NEXT SESSION: Assess response to HEP; progress therex as indicated; use of modalities, manual therapy; and TPDN as indicated. Assess response to Estim and give info if interested.    Derrek Puff MS, PT 09/22/22 8:36 AM

## 2022-09-21 ENCOUNTER — Ambulatory Visit: Payer: Medicaid Other

## 2022-09-21 DIAGNOSIS — R252 Cramp and spasm: Secondary | ICD-10-CM

## 2022-09-21 DIAGNOSIS — M542 Cervicalgia: Secondary | ICD-10-CM

## 2022-09-21 DIAGNOSIS — R293 Abnormal posture: Secondary | ICD-10-CM

## 2022-09-27 ENCOUNTER — Ambulatory Visit: Payer: Medicaid Other | Admitting: Physical Therapy

## 2022-09-28 ENCOUNTER — Ambulatory Visit (INDEPENDENT_AMBULATORY_CARE_PROVIDER_SITE_OTHER): Payer: Medicaid Other

## 2022-09-28 ENCOUNTER — Ambulatory Visit
Admission: EM | Admit: 2022-09-28 | Discharge: 2022-09-28 | Disposition: A | Discharge status: Home / Self Care | Payer: Medicaid Other | Attending: Family Medicine | Admitting: Family Medicine

## 2022-09-28 DIAGNOSIS — M25512 Pain in left shoulder: Secondary | ICD-10-CM | POA: Diagnosis not present

## 2022-09-28 MED ORDER — PREDNISONE 20 MG PO TABS: 40.0000 mg | ORAL_TABLET | Freq: Every day | ORAL | 0 refills | Status: AC

## 2022-09-28 MED ORDER — OXYCODONE-ACETAMINOPHEN 10-325 MG PO TABS: 1.0000 | ORAL_TABLET | Freq: Four times a day (QID) | ORAL | 0 refills | Status: DC | PRN

## 2022-09-28 NOTE — Discharge Instructions (Signed)
Your x-ray did not show any bony abnormality.  Take prednisone 20 mg--2 daily for 5 days  Oxycodone-acetaminophen 10 mg - 325 mg--1 tablet every 6 hours as needed for pain  Follow-up with your primary care

## 2022-09-28 NOTE — ED Triage Notes (Signed)
Pt presents with left shoulder pain X 3 days.

## 2022-09-28 NOTE — ED Provider Notes (Addendum)
[facility]    CSN: [id number] Arrival date & time: 09/28/22  1423      History   Chief Complaint Chief Complaint  Patient presents with   Shoulder Pain    HPI [name] is a 47 y.o. female.    Shoulder Pain  Here for left shoulder pain.  It began 1 evening with a little bit of an ache, and then after she slept the next morning it was hurting a lot.  She cannot raise her hand.  No elbow or hand pain and no numbness or tingling in her arm.  She is undergoing physical therapy for her neck  She took a Percocet that had been prescribed for carpal tunnel surgery recovery and it did not help  Past Medical History:  Diagnosis Date   Anxiety    Cancer ([place])    ovarian cancer   Carpal tunnel syndrome of right wrist 11/2013   Depression    Ganglion cyst of wrist 11/2013   right   Hypertension    is on 3 meds. - states BP is under control; has been on med. x 12 yr.   Migraines    Right tennis elbow 11/2013    Patient Active Problem List   Diagnosis Date Noted   Class 3 severe obesity due to excess calories with body mass index (BMI) of 45.0 to 49.9 in adult, unspecified whether serious comorbidity present ([place]) 09/04/2022   Chronic neck pain 09/04/2022   Coronary artery disease involving native coronary artery of native heart without angina pectoris 06/27/2022   Carpal tunnel syndrome on left 11/28/2021   Numbness and tingling in left hand 10/11/2021   Abdominal cramping 09/26/2021   Hormone replacement therapy 09/26/2021   Pelvic mass 04/11/2021   Teratoma, malignant ([place]) 09/21/2020   Premature atrial contractions 06/10/2020   PVC (premature ventricular contraction) 06/10/2020   Acanthosis nigricans, acquired 06/14/2018   Prediabetes 06/14/2018   GAD (generalized anxiety disorder) 08/30/2017   Hypoestrogenism 08/30/2017   Migraine without aura 08/10/2015   GERD without esophagitis 08/10/2015   Obesity due to excess calories with serious comorbidity  08/10/2015   Primary insomnia 08/10/2015   Spinal stenosis, lumbar 01/14/2015   Carpal tunnel syndrome 12/12/2013   HYPERLIPIDEMIA 12/05/2007   Essential hypertension 12/05/2007   BRADYCARDIA 12/05/2007   DEGENERATIVE JOINT DISEASE 12/05/2007    Past Surgical History:  Procedure Laterality Date   ABDOMINAL HYSTERECTOMY     complete   CARPAL TUNNEL RELEASE Right 12/12/2013   Procedure: ENDOSCOPY RIGHT WRIST CARPAL TUNNEL RELEASE EXCISION GANGLION WRIST PRIMARY ;  Surgeon: Renette Butters, MD;  Location: [place];  Service: Orthopedics;  Laterality: Right;   CARPAL TUNNEL RELEASE Left 05/31/2022   Procedure: LEFT CARPAL TUNNEL RELEASE;  Surgeon: Leandrew Koyanagi, MD;  Location: [place];  Service: Orthopedics;  Laterality: Left;   CESAREAN SECTION  03/11/2005; 04/05/2008   CHOLECYSTECTOMY  02/11/2004   lap. chole.   LATERAL EPICONDYLE RELEASE Right 12/12/2013   Procedure: RIGHT TENNIS ELBOW RELEASE WITH BONE REPAIR;  Surgeon: Renette Butters, MD;  Location: [place];  Service: Orthopedics;  Laterality: Right;   LUMBAR LAMINECTOMY WITH COFLEX 1 LEVEL Bilateral 01/14/2015   Procedure: Laminectomy - bilateral - L4-L5 ;  Surgeon: Karie Chimera, MD;  Location: [place] [facility];  Service: Neurosurgery;  Laterality: Bilateral;  Laminectomy - bilateral - L4-L5    LUMBAR WOUND DEBRIDEMENT N/A 02/02/2015   Procedure: LUMBAR WOUND DEBRIDEMENT, WASHOUT, CLOSURE;  Surgeon: Consuella Lose, MD;  Location: [place] [facility];  Service: Neurosurgery;  Laterality: N/A;  lumbar wound debridement   TUBAL LIGATION  04/05/2008    OB History   No obstetric history on file.      Home Medications    Prior to Admission medications   Medication Sig Start Date End Date Taking? Authorizing Provider  oxyCODONE-acetaminophen (PERCOCET) 10-325 MG tablet Take 1 tablet by mouth every 6 (six) hours as needed for pain. 09/28/22  Yes Barrett Henle, MD  predniSONE (DELTASONE) 20  MG tablet Take 2 tablets (40 mg total) by mouth daily with breakfast for 5 days. 09/28/22 10/03/22 Yes Amani Nodarse, Gwenlyn Perking, MD  Vitamin D, Ergocalciferol, (DRISDOL) 1.25 MG (50000 UNIT) CAPS capsule Take 1 capsule (50,000 Units total) by mouth every 7 (seven) days. 09/05/22   Minette Brine, FNP  amLODipine (NORVASC) 10 MG tablet Take 1 tablet (10 mg total) by mouth daily. 08/02/22   Minette Brine, FNP  aspirin-acetaminophen-caffeine (EXCEDRIN MIGRAINE) [id number] MG tablet Take 2 tablets by mouth daily as needed for migraine.    [provider]  atorvastatin (LIPITOR) 40 MG tablet Take 40 mg by mouth daily.    [provider]  clopidogrel (PLAVIX) 75 MG tablet Take 1 tablet by mouth daily. 06/14/22   [provider]  estradiol (ESTRACE) 2 MG tablet Take 1 tablet (2 mg total) by mouth daily. 08/02/22   Minette Brine, FNP  hydrochlorothiazide (HYDRODIURIL) 25 MG tablet Take 1 tablet (25 mg total) by mouth every morning. 04/10/22   Minette Brine, FNP  losartan (COZAAR) 100 MG tablet Take 1 tablet (100 mg total) by mouth daily. 08/02/22   Minette Brine, FNP  Magnesium Gluconate 250 MG TABS Take 1 tablet (250 mg total) by mouth every evening. Take with evening meal 09/04/22   Minette Brine, FNP  methocarbamol (ROBAXIN-750) 750 MG tablet Take 1 tablet (750 mg total) by mouth 2 (two) times daily as needed for muscle spasms. 07/25/22   Aundra Dubin, PA-C  pantoprazole (PROTONIX) 40 MG tablet Take 1 tablet (40 mg total) by mouth daily. 11/28/21 11/28/22  Minette Brine, FNP  potassium chloride SA (KLOR-CON M) 20 MEQ tablet Take 20 mEq by mouth 2 (two) times daily.    [provider]    Family History Family History  Problem Relation Age of Onset   Hypertension Mother    Diabetes Mother    Cancer Mother    Diabetes Brother    Hypertension Brother    Diabetes Brother    Hypertension Maternal Grandmother    Heart attack Paternal Grandmother    Cancer Other     Social  History Social History   Tobacco Use   Smoking status: Never   Smokeless tobacco: Never  Vaping Use   Vaping Use: Never used  Substance Use Topics   Alcohol use: No   Drug use: No     Allergies   Venlafaxine   Review of Systems Review of Systems   Physical Exam Triage Vital Signs ED Triage Vitals  Enc Vitals Group     BP 09/28/22 1458 126/86     Pulse Rate 09/28/22 1458 75     Resp 09/28/22 1458 18     Temp 09/28/22 1458 97.7 F (36.5 C)     Temp Source 09/28/22 1458 Oral     SpO2 09/28/22 1458 98 %     Weight --      Height --      Head Circumference --  Peak Flow --      Pain Score 09/28/22 1459 6     Pain Loc --      Pain Edu? --      Excl. in [place]? --    No data found.  Updated Vital Signs BP 126/86 (BP Location: Right Arm)   Pulse 75   Temp 97.7 F (36.5 C) (Oral)   Resp 18   LMP 09/17/2010   SpO2 98%   Visual Acuity Right Eye Distance:   Left Eye Distance:   Bilateral Distance:    Right Eye Near:   Left Eye Near:    Bilateral Near:     Physical Exam Vitals reviewed.  Constitutional:      General: She is not in acute distress.    Appearance: She is not ill-appearing, toxic-appearing or diaphoretic.  Musculoskeletal:     Comments: There is tenderness of the musculature of the left shoulder.  There is pain on passive abduction of the left arm at the shoulder  Neurological:     General: No focal deficit present.     Mental Status: She is alert and oriented to person, place, and time.  Psychiatric:        Behavior: Behavior normal.      UC Treatments / Results  Labs (all labs ordered are listed, but only abnormal results are displayed) Labs Reviewed - No data to display  EKG   Radiology DG Shoulder Left  Result Date: 09/28/2022 CLINICAL DATA:  Left shoulder pain. EXAM: LEFT SHOULDER - 2+ VIEW COMPARISON:  None Available. FINDINGS: There is no evidence of fracture or dislocation. There is no evidence of arthropathy or other  focal bone abnormality. Soft tissues are unremarkable. IMPRESSION: Negative. Electronically Signed   By: Anner Crete M.D.   On: 09/28/2022 15:35    Procedures Procedures (including critical care time)  Medications Ordered in UC Medications - No data to display  Initial Impression / Assessment and Plan / UC Course  I have reviewed the triage vital signs and the nursing notes.  Pertinent labs & imaging results that were available during my care of the patient were reviewed by me and considered in my medical decision making (see chart for details).       X-ray does not show any bony abnormality.  I discussed with the patient that this is most likely muscular.  With her taking Plavix I am not going to do any anti-inflammatory prescriptions.  Prednisone is sent in for possible inflammation, and I am sending in a bigger dose of the oxycodone and a small quantity.  Have asked her to follow-up with her primary care.  She is given contact information for orthopedics Final Clinical Impressions(s) / UC Diagnoses   Final diagnoses:  Acute pain of left shoulder     Discharge Instructions      Your x-ray did not show any bony abnormality.  Take prednisone 20 mg--2 daily for 5 days  Oxycodone-acetaminophen 10 mg - 325 mg--1 tablet every 6 hours as needed for pain  Follow-up with your primary care     ED Prescriptions     Medication Sig Dispense Auth. Provider   predniSONE (DELTASONE) 20 MG tablet Take 2 tablets (40 mg total) by mouth daily with breakfast for 5 days. 10 tablet Barrett Henle, MD   oxyCODONE-acetaminophen (PERCOCET) 10-325 MG tablet Take 1 tablet by mouth every 6 (six) hours as needed for pain. 12 tablet Windy Carina, Gwenlyn Perking, MD  I have reviewed the PDMP during this encounter.   Barrett Henle, MD 09/28/22 1548    Barrett Henle, MD 09/28/22 [phone]

## 2022-09-29 ENCOUNTER — Ambulatory Visit: Payer: Medicaid Other | Admitting: Physical Therapy

## 2022-10-03 ENCOUNTER — Telehealth (HOSPITAL_COMMUNITY): Payer: Self-pay

## 2022-10-03 ENCOUNTER — Ambulatory Visit: Payer: Medicaid Other | Attending: Nurse Practitioner

## 2022-10-03 DIAGNOSIS — R293 Abnormal posture: Secondary | ICD-10-CM | POA: Diagnosis present

## 2022-10-03 DIAGNOSIS — M542 Cervicalgia: Secondary | ICD-10-CM | POA: Diagnosis not present

## 2022-10-03 DIAGNOSIS — R252 Cramp and spasm: Secondary | ICD-10-CM | POA: Insufficient documentation

## 2022-10-03 NOTE — Telephone Encounter (Signed)
Called patient to see if she was interested in participating in the Cardiac Rehab Program. Patient stated yes. Patient will come in for orientation on 10/10/22 @ 9:30AM and will attend the 1:45PM exercise class. Went over insurance, patient verbalized understanding.   Tourist information centre manager.

## 2022-10-03 NOTE — Telephone Encounter (Signed)
Pt insurance is active and benefits verified through [facility]. Co-pay $4.00, DED $0.00/$0.00 met, out of pocket $0.00/$0.00 met, co-insurance 0%. No pre-authorization required. [place] E./[employer], 10/03/22 @ 11:52AM, [phone]   How many CR sessions are covered? (36 sessions for TCR, 72 sessions for ICR)36 TCR Is this a lifetime maximum or an annual maximum? Annual Has the member used any of these services to date? No Is there a time limit (weeks/months) on start of program and/or program completion? No     Will contact patient to see if she is interested in the Cardiac Rehab Program.

## 2022-10-03 NOTE — Therapy (Signed)
OUTPATIENT PHYSICAL THERAPY TREATMENT NOTE/Discharge   Patient Name: [name] MRN: [id number] DOB:[birth year], 47 y.o., female Today's Date: 10/03/2022  PCP: Minette Brine, FNP  REFERRING PROVIDER: Aundra Dubin, PA-C    END OF SESSION:   PT End of Session - 10/03/22 1638     Visit Number 13    Number of Visits 15    Date for PT Re-Evaluation 10/07/22    Authorization Type [place] MEDICAID [facility]    Authorization Time Period Approved 14 PT visits from 08/11/22-10/10/22    Authorization - Visit Number 12    Authorization - Number of Visits 14    Progress Note Due on Visit 10    PT Start Time 1636    PT Stop Time 1717    PT Time Calculation (min) 41 min    Activity Tolerance Patient tolerated treatment well    Behavior During Therapy [facility] for tasks assessed/performed                  Past Medical History:  Diagnosis Date   Anxiety    Cancer ([place])    ovarian cancer   Carpal tunnel syndrome of right wrist 11/2013   Depression    Ganglion cyst of wrist 11/2013   right   Hypertension    is on 3 meds. - states BP is under control; has been on med. x 12 yr.   Migraines    Right tennis elbow 11/2013   Past Surgical History:  Procedure Laterality Date   ABDOMINAL HYSTERECTOMY     complete   CARPAL TUNNEL RELEASE Right 12/12/2013   Procedure: ENDOSCOPY RIGHT WRIST CARPAL TUNNEL RELEASE EXCISION GANGLION WRIST PRIMARY ;  Surgeon: Renette Butters, MD;  Location: [place];  Service: Orthopedics;  Laterality: Right;   CARPAL TUNNEL RELEASE Left 05/31/2022   Procedure: LEFT CARPAL TUNNEL RELEASE;  Surgeon: Leandrew Koyanagi, MD;  Location: [place];  Service: Orthopedics;  Laterality: Left;   CESAREAN SECTION  03/11/2005; 04/05/2008   CHOLECYSTECTOMY  02/11/2004   lap. chole.   LATERAL EPICONDYLE RELEASE Right 12/12/2013   Procedure: RIGHT TENNIS ELBOW RELEASE WITH BONE REPAIR;  Surgeon: Renette Butters, MD;  Location: [place];  Service: Orthopedics;  Laterality: Right;   LUMBAR LAMINECTOMY WITH COFLEX 1 LEVEL Bilateral 01/14/2015   Procedure: Laminectomy - bilateral - L4-L5 ;  Surgeon: Karie Chimera, MD;  Location: [place] [facility];  Service: Neurosurgery;  Laterality: Bilateral;  Laminectomy - bilateral - L4-L5    LUMBAR WOUND DEBRIDEMENT N/A 02/02/2015   Procedure: LUMBAR WOUND DEBRIDEMENT, WASHOUT, CLOSURE;  Surgeon: Consuella Lose, MD;  Location: [place] [facility];  Service: Neurosurgery;  Laterality: N/A;  lumbar wound debridement   TUBAL LIGATION  04/05/2008   Patient Active Problem List   Diagnosis Date Noted   Class 3 severe obesity due to excess calories with body mass index (BMI) of 45.0 to 49.9 in adult, unspecified whether serious comorbidity present ([place]) 09/04/2022   Chronic neck pain 09/04/2022   Coronary artery disease involving native coronary artery of native heart without angina pectoris 06/27/2022   Carpal tunnel syndrome on left 11/28/2021   Numbness and tingling in left hand 10/11/2021   Abdominal cramping 09/26/2021   Hormone replacement therapy 09/26/2021   Pelvic mass 04/11/2021   Teratoma, malignant ([place]) 09/21/2020   Premature atrial contractions 06/10/2020   PVC (premature ventricular contraction) 06/10/2020   Acanthosis nigricans, acquired 06/14/2018   Prediabetes 06/14/2018   GAD (  generalized anxiety disorder) 08/30/2017   Hypoestrogenism 08/30/2017   Migraine without aura 08/10/2015   GERD without esophagitis 08/10/2015   Obesity due to excess calories with serious comorbidity 08/10/2015   Primary insomnia 08/10/2015   Spinal stenosis, lumbar 01/14/2015   Carpal tunnel syndrome 12/12/2013   HYPERLIPIDEMIA 12/05/2007   Essential hypertension 12/05/2007   BRADYCARDIA 12/05/2007   DEGENERATIVE JOINT DISEASE 12/05/2007    REFERRING DIAG: Cervicalgia   THERAPY DIAG:  Cervicalgia  Abnormal posture  Cramp and spasm  Rationale for Evaluation and Treatment  Rehabilitation  ONSET DATE: Approx 1 year    SUBJECTIVE:                                       SUBJECTIVE STATEMENT: Pt reports 09/25/22 her L arm started to feel achy that evening and it woke her up from her sleep c significant pain she she was not able to move her L arm due to the pain. She went to urgent care and received prednisone and a muscle relaxor. On 09/27/22 the pain started improving. She is still experiencing some L arm pain. Since the start of PT, pt estimates 30% improvement in her pain. She finds the chin tuck helps with managing her pain.  Are you having pain?  Yes: NPRS scale: 6/10; L arm pain 6/10 Pain location: Neck, left Pain description: Dull, burning Aggravating factors: Sleeping, hurts all the time Relieving factors: Stretching my neck back At night 8-9/10  PERTINENT HISTORY:  HTN; carpal tunnel release R 11/25/13, L 05/31/22; Lumbar laminectomy 01/14/15    PRECAUTIONS: None   WEIGHT BEARING RESTRICTIONS: No   FALLS:  Has patient fallen in last 6 months? No   LIVING ENVIRONMENT: Lives with: lives with their family Lives in: House/apartment Pt reports she is able to access and be mobilie within her home   OCCUPATION: Billing-computer work   PLOF: Independent   PATIENT GOALS: Pain relief    OBJECTIVE: (objective measures completed at initial evaluation unless otherwise dated) DIAGNOSTIC FINDINGS:  05/24/22 X-rays demonstrate moderate degenerative changes C5-6 C6-7 with  straightening of the cervical spine   PATIENT SURVEYS:  NDI 17/50= 34%  Moderate disability   POSTURE: rounded shoulders, forward head, and increased thoracic kyphosis, and CT step off   PALPATION: TTP peri-CT junction and paraspinal and upper trap areas        CERVICAL ROM:    Active ROM A/PROM (deg) eval AROM 08/16/22  09/04/2022 AROM 10/03/22  Flexion 43 no P  45 45  Extension 44 P ant  55 55  Right lateral flexion 45 pull L 50  50  Left lateral flexion 40 pull R 50  45   Right rotation 60 no P 70 70 70  Left rotation 50 np P 65 65 65  P=provocation  (Blank rows = not tested)   UPPER EXTREMITY ROM: WNLs and equal Active ROM Right eval Left eval  Shoulder flexion      Shoulder extension      Shoulder abduction      Shoulder adduction      Shoulder extension      Shoulder internal rotation      Shoulder external rotation      Elbow flexion      Elbow extension      Wrist flexion      Wrist extension      Wrist ulnar deviation  Wrist radial deviation      Wrist pronation      Wrist supination       (Blank rows = not tested)   UPPER EXTREMITY MMT: Myotome screen negative MMT Right eval Left eval Lt 10/03/22  Shoulder flexion       Shoulder extension       Shoulder abduction       Shoulder adduction       Shoulder extension       Shoulder internal rotation       Shoulder external rotation       Middle trapezius       Lower trapezius       Elbow flexion       Elbow extension       Wrist flexion       Wrist extension       Wrist ulnar deviation       Wrist radial deviation       Wrist pronation       Wrist supination       Grip strength 64,40 19, 19 capal tunnel sugery 11/*/23 30,35 32.5 10/03/22   (Blank rows = not tested)   CERVICAL SPECIAL TESTS:  Neck flexor muscle endurance test: Positive, Spurling's test: Negative, and Distraction test: Negative DNF=11"   FUNCTIONAL TESTS:  NT DNF 20 sec 09/11/22    TODAY'S TREATMENT: [facility] Adult PT Treatment:                                                DATE: 10/03/22 Therapeutic Exercise: Supine cervical retractions x5 3 Supine DNF x10 5" Seated cervical side bending x2 15"  over pressure Seated cervical rotation x2 15"  over pressure Blue band rows x 20 Shoulder ext green x 20 Green band ER bilat with scap retract Doorway stretch Supine lying over rolled yoga mat - arms overhead  Open books each way  Final HEP Self Care: Administered NDI and discussed results  [place]  Adult PT Treatment:                                                DATE: 09/21/22 Therapeutic Exercise: Supine cervical retractions x10 3" Supine DNF x10 5" Prone Y, H, I's x15 each Standing cervical retraction with wall feedback Contract/relax stretching x2 15" each Manual Therapy: STM/DTM to the cervical and upper thoracic paraspinals Manual cervical traction Suboccipital release Contract relax stretching for cervical side bending  [facility] Adult PT Treatment:                                                DATE: 09/18/22 Therapeutic Exercise: UBE L2 3 min each way  Blue band rows x 20 Shoulder ext green x 20 Green band ER bilat with scap retract Doorway stretch Supine lying over rolled yoga mat - arms overhead  Open books each way   Modalities: Estim: lower cervical, upper throracic: IFC 21 mA x 15 min concurrent with HMP   [facility] Adult PT Treatment:  DATE: 09/15/22 Therapeutic Exercise: Seated cross arm thoracic stretch x2 20' Cat/camel x5  5" Qped arm reach through x3 15" each Open book x5 5" Thoracic ext c soft foam roller c pec stretch Standing shoulder hor star pattern 3x5 patterns GTB Manual Therapy: STM/DTM to the upper thoracic paraspinals Skilled palpation to identify TrPs and taut muscle bands Trigger Point Dry Needling Treatment: Pre-treatment instruction: Patient instructed on dry needling rationale, procedures, and possible side effects including pain during treatment (achy,cramping feeling), bruising, drop of blood, lightheadedness, nausea, sweating. Patient Consent Given: Yes Education handout provided: Yes Muscles treated: L and R upper thoracic paraspinals T1-T4 Needle size and number: .30x30m x 2 Electrical stimulation performed: No Parameters: N/A Treatment response/outcome: Twitch response elicited Post-treatment instructions: Patient instructed to expect possible mild to moderate muscle soreness later today  and/or tomorrow. Patient instructed in methods to reduce muscle soreness and to continue prescribed HEP. If patient was dry needled over the lung field, patient was instructed on signs and symptoms of pneumothorax and, however unlikely, to see immediate medical attention should they occur. Patient was also educated on signs and symptoms of infection and to seek medical attention should they occur. Patient verbalized understanding of these instructions and education.    PATIENT EDUCATION:  Education details: HEP update Person educated: Patient Education method: Explanation, Demonstration, Tactile cues, Verbal cues, and Handouts Education comprehension: verbalized understanding, returned demonstration, verbal cues required, and tactile cues required   HOME EXERCISE PROGRAM: Access Code: [email or web address]    ASSESSMENT:  CLINICAL IMPRESSION: Pt completed her final PT session today. Over the course of PT some areas have made progress ie, neck pain at night has improved minimally, cervical ROM has improved, and pt finds certain HEP exs help to manage her pain, however other areas have not improved. ie overall cervical pain with a certain significant episode requiring a urgent care visit and the NDI rating declining. Pt is ind in a final HEP. Pt is to schedule a f/u appt with MDwana Melena PA-C to determine future course of care.  OBJECTIVE IMPAIRMENTS: decreased ROM,decreased strength, increased fascial restrictions, increased muscle spasms, postural dysfunction, and obesity.   ACTIVITY LIMITATIONS: carrying, lifting, reach over head, and caring for others   PARTICIPATION LIMITATIONS: meal prep, cleaning, laundry, driving, and occupation   PERSONAL FACTORS: Past/current experiences, Profession, Time since onset of injury/illness/exacerbation, and 1 comorbidity: carpal tunnel releases  are also affecting patient's functional outcome.      GOALS: SHORT TERM GOALS = LTGs   LONG TERM GOALS: Target date:  10/07/22   Pt will be Ind in a final HEP to maintain achieved LOF Baseline: initiated Goal status:Met 10/03/22   2.  Increase cervical side bending by 5d and rotation by 10d for improved neck function c less pain Baseline: see flow sheets Status: 08/16/22= see flow sheets Goal status: MET   3.  Increase DNF endurance to 30" for improved neck function c less pain Baseline: 11" 09/11/22: 20 sec  Goal status:MET   4.  Pt will report improve quality of with pt able to sleep comfortably for 6 hours Baseline: 2 hours 09/11/22: 2 hours , wakes 4-5 times per night  [place]: 10/03/22=2-3x per night Goal status: Not met, Improved   5.  Pt will report a decrease in neck pain to 4/10 or less with daily activities  Baseline: 3-9/10 Status: 3-9/10 Goal status: Not met   6.  Pt's NDI score will improve to 12/50, 24% Min disability or less as indication of  improved function Baseline: 17/50 35% Mod disability Status: 10/03/22 44% Mod disability Goal status: Not met     PLAN: PT FREQUENCY: 1-2x/week   PT DURATION: 2 weeks   PLANNED INTERVENTIONS: Therapeutic exercises, Therapeutic activity, Patient/Family education, Self Care, Aquatic Therapy, Dry Needling, Electrical stimulation, Spinal manipulation, Spinal mobilization, Cryotherapy, Moist heat, Taping, Vasopneumatic device, Traction, Ultrasound, Ionotophoresis '4mg'$ /ml Dexamethasone, Manual therapy, and Re-evaluation   PLAN FOR NEXT SESSION: Assess response to HEP; progress therex as indicated; use of modalities, manual therapy; and TPDN as indicated. Assess response to Estim and give info if interested.   PHYSICAL THERAPY DISCHARGE SUMMARY  Visits from Start of Care: 12  Current functional level related to goals / functional outcomes: See clinical impression and PT goals    Remaining deficits: See clinical impression and PT goals    Education / Equipment: HEP, use of tennis ball for massage   Patient agrees to discharge. Patient goals  were partially met. Patient is being discharged due to lack of progress.   Brittie Whisnant MS, PT 10/03/22 5:58 PM

## 2022-10-09 ENCOUNTER — Telehealth (HOSPITAL_COMMUNITY): Payer: Self-pay

## 2022-10-09 NOTE — Telephone Encounter (Signed)
Pt called to confirm her cardiac rehab orientation appointment tomorrow at 9:30am. Nursing assessment completed. Instructions and directions given about appointment. Pt understands without assistance.

## 2022-10-10 ENCOUNTER — Encounter (HOSPITAL_COMMUNITY)
Admission: RE | Admit: 2022-10-10 | Discharge: 2022-10-10 | Disposition: A | Discharge status: Home / Self Care | Payer: Medicaid Other | Source: Ambulatory Visit | Attending: Cardiology | Admitting: Cardiology

## 2022-10-10 ENCOUNTER — Encounter (HOSPITAL_COMMUNITY): Payer: Self-pay

## 2022-10-10 VITALS — BP 128/90 | HR 69 | Ht 61.5 in | Wt 248.7 lb

## 2022-10-10 DIAGNOSIS — Z955 Presence of coronary angioplasty implant and graft: Secondary | ICD-10-CM | POA: Insufficient documentation

## 2022-10-10 DIAGNOSIS — E785 Hyperlipidemia, unspecified: Secondary | ICD-10-CM | POA: Diagnosis not present

## 2022-10-10 DIAGNOSIS — Z48812 Encounter for surgical aftercare following surgery on the circulatory system: Secondary | ICD-10-CM | POA: Diagnosis present

## 2022-10-10 DIAGNOSIS — I2510 Atherosclerotic heart disease of native coronary artery without angina pectoris: Secondary | ICD-10-CM | POA: Diagnosis not present

## 2022-10-10 DIAGNOSIS — I10 Essential (primary) hypertension: Secondary | ICD-10-CM | POA: Insufficient documentation

## 2022-10-10 HISTORY — DX: Atherosclerotic heart disease of native coronary artery without angina pectoris: I25.10

## 2022-10-10 NOTE — Progress Notes (Signed)
Patient here for cardiac rehab orientation admits to being currently depressed. [name] did not disclose the reason for her depression but did say that she received counseling prior to the pandemic she had to stop her sessions during this time. Patient tearful. Emotional support provided. [name] said she would like to discuss her current state of depression with her [place] FNP.  Appointment obtained for [name] to see Ms Laurance Flatten on Thursday at 3:00 pm. [name] says she is interested in counseling. Will continue to monitor the patient throughout  the [place] RN BSN

## 2022-10-10 NOTE — Progress Notes (Addendum)
Cardiac Individual Treatment Plan  Patient Details  Name: [name] MRN: [id number] Date of Birth: 1976/01/27 Referring Provider:   Flowsheet Row CARDIAC REHAB PHASE II ORIENTATION from 10/10/2022 in [facility] [place]  Referring Provider Flossie Buffy, MD  Sueanne Margarita, MD]       Initial Encounter Date:  [place] PHASE II ORIENTATION from 10/10/2022 in [facility]  Date 10/10/22       Visit Diagnosis: 06/13/22 DES LAD  Patient's Home Medications on Admission:  Current Outpatient Medications:    Vitamin D, Ergocalciferol, (DRISDOL) 1.25 MG (50000 UNIT) CAPS capsule, Take 1 capsule (50,000 Units total) by mouth every 7 (seven) days., Disp: 12 capsule, Rfl: 1   amLODipine (NORVASC) 10 MG tablet, Take 1 tablet (10 mg total) by mouth daily., Disp: 90 tablet, Rfl: 0   aspirin-acetaminophen-caffeine (EXCEDRIN MIGRAINE) 250-250-65 MG tablet, Take 2 tablets by mouth daily as needed for migraine., Disp: , Rfl:    atorvastatin (LIPITOR) 40 MG tablet, Take 40 mg by mouth daily., Disp: , Rfl:    clopidogrel (PLAVIX) 75 MG tablet, Take 1 tablet by mouth daily., Disp: , Rfl:    estradiol (ESTRACE) 2 MG tablet, Take 1 tablet (2 mg total) by mouth daily., Disp: 30 tablet, Rfl: 1   hydrochlorothiazide (HYDRODIURIL) 25 MG tablet, Take 1 tablet (25 mg total) by mouth every morning., Disp: 90 tablet, Rfl: 1   losartan (COZAAR) 100 MG tablet, Take 1 tablet (100 mg total) by mouth daily., Disp: 90 tablet, Rfl: 1   Magnesium Gluconate 250 MG TABS, Take 1 tablet (250 mg total) by mouth every evening. Take with evening meal (Patient not taking: Reported on 10/10/2022), Disp: 90 tablet, Rfl: 1   methocarbamol (ROBAXIN-750) 750 MG tablet, Take 1 tablet (750 mg total) by mouth 2 (two) times daily as needed for muscle spasms. 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 - states BP is under control; has been on med. x 12 yr.   Migraines    Right tennis elbow 11/21/2013    Tobacco Use: Social History   Tobacco Use  Smoking Status Never  Smokeless Tobacco Never    Labs: Review Flowsheet       Latest Ref Rng & Units 12/12/2007 08/01/2021 05/03/2022 09/04/2022  Labs for ITP Cardiac and Pulmonary Rehab  Cholestrol 100 - 199 mg/dL - - - 149   LDL (calc) 0 - 99 mg/dL - - - 75   HDL-C >39 mg/dL - - - 44   Trlycerides 0 - 149 mg/dL - - - 176   Hemoglobin A1c 4.8 - 5.6 % - 6.1  5.8  6.1   TCO2 - 22  - - -    Capillary Blood Glucose: No results found  for: "GLUCAP"   Exercise Target Goals: Exercise Program Goal: Individual exercise prescription set using results from initial 6 min walk test and THRR while considering  patient's activity barriers and safety.   Exercise Prescription Goal: Initial exercise prescription builds to 30-45 minutes a day of aerobic activity, 2-3 days per week.  Home exercise guidelines will be given to patient during program as part of exercise prescription that the participant will acknowledge.  Activity Barriers & Risk Stratification:  Activity Barriers & Cardiac Risk Stratification - 10/10/22 1331       Activity Barriers & Cardiac Risk Stratification   Activity Barriers Neck/Spine Problems;Other (comment)    Comments Spinal stenosis-chronic pain, lamenectomy L4-L5, no overhead press. Bilateral carpal tunnel  release.    Cardiac Risk Stratification Moderate             6 Minute Walk:  6 Minute Walk     Row Name 10/10/22 1119         6 Minute Walk   Phase Initial     Distance 1197 feet     Walk Time 6 minutes     # of Rest Breaks 0     MPH 2.27     METS 3.03     RPE 10     Perceived Dyspnea  1     VO2 Peak 10.59     Symptoms Yes (comment)     Comments Mild SOB, RPD=1     Resting HR 69 bpm     Resting BP 128/90     Resting Oxygen Saturation  99 %     Exercise Oxygen Saturation  during 6 min walk 98 %     Max Ex. HR 86 bpm     Max Ex. BP 138/84     2 Minute Post BP 122/88              Oxygen Initial Assessment:   Oxygen Re-Evaluation:   Oxygen Discharge (Final Oxygen Re-Evaluation):   Initial Exercise Prescription:  Initial Exercise Prescription - 10/10/22 1300       Date of Initial Exercise RX and Referring Provider   Date 10/10/22    Referring Provider Flossie Buffy, MD   Sueanne Margarita, MD   Expected Discharge Date 12/12/22      Treadmill   MPH 1.7    Grade 0    Minutes 15    METs 2.3      NuStep   Level 2    SPM 80    Minutes 15    METs 2.2      Prescription Details   Frequency (times per week) 2    Duration Progress to 30 minutes of continuous aerobic without signs/symptoms of physical distress      Intensity   THRR 40-80% of Max Heartrate 70-141    Ratings of Perceived Exertion 11-13    Perceived Dyspnea 0-4      Progression   Progression Continue to progress workloads to maintain intensity without signs/symptoms of physical distress.      Resistance Training   Training Prescription Yes    Weight 2 lbs    Reps 10-15             Perform Capillary Blood Glucose checks as needed.  Exercise Prescription Changes:   Exercise Comments:   Exercise Goals and Review:   Exercise Goals     Row Name 10/10/22 1105             Exercise  Goals   Increase Physical Activity Yes       Intervention Provide advice,  education, support and counseling about physical activity/exercise needs.;Develop an individualized exercise prescription for aerobic and resistive training based on initial evaluation findings, risk stratification, comorbidities and participant's personal goals.       Expected Outcomes Short Term: Attend rehab on a regular basis to increase amount of physical activity.;Long Term: Add in home exercise to make exercise part of routine and to increase amount of physical activity.;Long Term: Exercising regularly at least 3-5 days a week.       Increase Strength and Stamina Yes       Intervention Provide advice, education, support and counseling about physical activity/exercise needs.;Develop an individualized exercise prescription for aerobic and resistive training based on initial evaluation findings, risk stratification, comorbidities and participant's personal goals.       Expected Outcomes Short Term: Increase workloads from initial exercise prescription for resistance, speed, and METs.;Short Term: Perform resistance training exercises routinely during rehab and add in resistance training at home;Long Term: Improve cardiorespiratory fitness, muscular endurance and strength as measured by increased METs and functional capacity (6MWT)       Able to understand and use rate of perceived exertion (RPE) scale Yes       Intervention Provide education and explanation on how to use RPE scale       Expected Outcomes Short Term: Able to use RPE daily in rehab to express subjective intensity level;Long Term:  Able to use RPE to guide intensity level when exercising independently       Knowledge and understanding of Target Heart Rate Range (THRR) Yes       Intervention Provide education and explanation of THRR including how the numbers were predicted and where they are located for reference       Expected Outcomes Short Term: Able to state/look up THRR;Long Term: Able to use THRR to govern intensity when exercising  independently;Short Term: Able to use daily as guideline for intensity in rehab       Able to check pulse independently Yes       Intervention Provide education and demonstration on how to check pulse in carotid and radial arteries.;Review the importance of being able to check your own pulse for safety during independent exercise       Expected Outcomes Short Term: Able to explain why pulse checking is important during independent exercise;Long Term: Able to check pulse independently and accurately       Understanding of Exercise Prescription Yes       Intervention Provide education, explanation, and written materials on patient's individual exercise prescription       Expected Outcomes Short Term: Able to explain program exercise prescription;Long Term: Able to explain home exercise prescription to exercise independently                Exercise Goals Re-Evaluation :   Discharge Exercise Prescription (Final Exercise Prescription Changes):   Nutrition:  Target Goals: Understanding of nutrition guidelines, daily intake of sodium 1500mg , cholesterol 200mg , calories 30% from fat and 7% or less from saturated fats, daily to have 5 or more servings of fruits and vegetables.  Biometrics:  Pre Biometrics - 10/10/22 0931       Pre Biometrics   Waist Circumference 52 inches    Hip Circumference 59.5 inches    Waist to Hip Ratio 0.87 %    Triceps Skinfold 45 mm    % Body Fat 56.8 %  Grip Strength 28 kg    Flexibility 10.5 in    Single Leg Stand 30 seconds              Nutrition Therapy Plan and Nutrition Goals:   Nutrition Assessments:  MEDIFICTS Score Key: ?70 Need to make dietary changes  40-70 Heart Healthy Diet ? 40 Therapeutic Level Cholesterol Diet    Picture Your Plate Scores: [id number] Unhealthy dietary pattern with much room for improvement. 41-50 Dietary pattern unlikely to meet recommendations for good health and room for improvement. 51-60 More healthful  dietary pattern, with some room for improvement.  >60 Healthy dietary pattern, although there may be some specific behaviors that could be improved.    Nutrition Goals Re-Evaluation:   Nutrition Goals Re-Evaluation:   Nutrition Goals Discharge (Final Nutrition Goals Re-Evaluation):   Psychosocial: Target Goals: Acknowledge presence or absence of significant depression and/or stress, maximize coping skills, provide positive support system. Participant is able to verbalize types and ability to use techniques and skills needed for reducing stress and depression.  Initial Review & Psychosocial Screening:  Initial Psych Review & Screening - 10/10/22 1456       Initial Review   Current issues with Current Depression      Family Dynamics   Good Support System? Yes   Kyrstin has her 3 children for support. Ayda lives with her family   Comments Mahlani reports that she has been experiencing some depression. Has received counselling in the past prior to the COVID 19 pandemic. Appointment made for [place] to discuss current depression with PCP      Barriers   Psychosocial barriers to participate in program The patient should benefit from training in stress management and relaxation.      Screening Interventions   Interventions Encouraged to exercise;To provide support and resources with identified psychosocial needs;Provide feedback about the scores to participant    Expected Outcomes Long Term Goal: Stressors or current issues are controlled or eliminated.;Short Term goal: Utilizing psychosocial counselor, staff and physician to assist with identification of specific Stressors or current issues interfering with healing process. Setting desired goal for each stressor or current issue identified.;Short Term goal: Identification and review with participant of any Quality of Life or Depression concerns found by scoring the questionnaire.;Long Term goal: The participant improves quality of Life and PHQ9  Scores as seen by post scores and/or verbalization of changes             Quality of Life Scores:  Quality of Life - 10/10/22 1218       Quality of Life   Select Quality of Life      Quality of Life Scores   Health/Function Pre 19.17 %    Socioeconomic Pre 23.86 %    Psych/Spiritual Pre 14.64 %    Family Pre 18 %    GLOBAL Pre 19.03 %            Scores of 19 and below usually indicate a poorer quality of life in these areas.  A difference of  2-3 points is a clinically meaningful difference.  A difference of 2-3 points in the total score of the Quality of Life Index has been associated with significant improvement in overall quality of life, self-image, physical symptoms, and general health in studies assessing change in quality of life.  PHQ-9: Review Flowsheet  More data exists      10/10/2022 09/04/2022 05/03/2022 03/06/2022 01/30/2022  Depression screen PHQ 2/9  Decreased Interest 2 0 0  0 0 0 0  Down, Depressed, Hopeless 2 0 0 0 0 0 0  PHQ - 2 Score 4 0 0 0 0 0 0  Altered sleeping 2 3 0 - 0  Tired, decreased energy 2 1 0 - 1  Change in appetite 0 0 0 - 0  Feeling bad or failure about yourself  1 0 0 - 0  Trouble concentrating 1 0 0 - 1  Moving slowly or fidgety/restless 0 0 0 - 0  Suicidal thoughts 0 0 0 - 0  PHQ-9 Score 10 4 0 - 2  Difficult doing work/chores Somewhat difficult Not difficult at all - - Not difficult at all   Interpretation of Total Score  Total Score Depression Severity:  1-4 = Minimal depression, 5-9 = Mild depression, 10-14 = Moderate depression, 15-19 = Moderately severe depression, 20-27 = Severe depression   Psychosocial Evaluation and Intervention:   Psychosocial Re-Evaluation:   Psychosocial Discharge (Final Psychosocial Re-Evaluation):   Vocational Rehabilitation: Provide vocational rehab assistance to qualifying candidates.   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 Participant will state understanding/return demonstration of topics presented.     Core Videos: Exercise    Move It!  Clinical staff conducted group or individual video education with verbal and written material and guidebook.  Patient learns the recommended Pritikin exercise program. Exercise with the goal of living a long, healthy life. Some of the health benefits of exercise include controlled diabetes, healthier blood pressure levels, improved cholesterol levels, improved heart and lung capacity, improved sleep, and better body composition. Everyone should speak with their doctor before starting or changing an exercise routine.  Biomechanical Limitations Clinical staff conducted group or individual video education with verbal and written material and guidebook.  Patient learns how biomechanical limitations can impact exercise and how we can mitigate and possibly overcome limitations to have an impactful and balanced exercise routine.  Body Composition Clinical staff conducted group or individual video education with verbal and written material and guidebook.  Patient learns that body composition (ratio of muscle mass to fat mass) is a key component to assessing overall fitness, rather than body weight alone. Increased fat mass, especially visceral belly fat, can put [place] at increased risk for metabolic syndrome, type 2 diabetes, heart disease, and even death. It is recommended to combine diet and exercise (cardiovascular and resistance training) to improve your body composition. Seek guidance from your physician and exercise physiologist before implementing an exercise  routine.  Exercise Action Plan Clinical staff conducted group or individual video education with verbal and written material and guidebook.  Patient learns the recommended strategies to achieve and enjoy long-term exercise adherence, including variety, self-motivation, self-efficacy, and positive decision making. Benefits of exercise include fitness, good health, weight management, more energy, better sleep, less stress, and overall well-being.  Medical   Heart Disease Risk Reduction Clinical staff conducted group or individual video education with verbal and written material and guidebook.  Patient learns our heart is our most vital organ as it circulates oxygen, nutrients, white blood cells, and hormones throughout the  entire body, and carries waste away. Data supports a plant-based eating plan like the Pritikin Program for its effectiveness in slowing progression of and reversing heart disease. The video provides a number of recommendations to address heart disease.   Metabolic Syndrome and Belly Fat  Clinical staff conducted group or individual video education with verbal and written material and guidebook.  Patient learns what metabolic syndrome is, how it leads to heart disease, and how one can reverse it and keep it from coming back. You have metabolic syndrome if you have 3 of the following 5 criteria: abdominal obesity, high blood pressure, high triglycerides, low HDL cholesterol, and high blood sugar.  Hypertension and Heart Disease Clinical staff conducted group or individual video education with verbal and written material and guidebook.  Patient learns that high blood pressure, or hypertension, is very common in the [place]. Hypertension is largely due to excessive salt intake, but other important risk factors include being overweight, physical inactivity, drinking too much alcohol, smoking, and not eating enough potassium from fruits and vegetables. High blood pressure is a  leading risk factor for heart attack, stroke, congestive heart failure, dementia, kidney failure, and premature death. Long-term effects of excessive salt intake include stiffening of the arteries and thickening of heart muscle and organ damage. Recommendations include ways to reduce hypertension and the risk of heart disease.  Diseases of Our Time - Focusing on Diabetes Clinical staff conducted group or individual video education with verbal and written material and guidebook.  Patient learns why the best way to stop diseases of our time is prevention, through food and other lifestyle changes. Medicine (such as prescription pills and surgeries) is often only a Band-Aid on the problem, not a long-term solution. Most common diseases of our time include obesity, type 2 diabetes, hypertension, heart disease, and cancer. The Pritikin Program is recommended and has been proven to help reduce, reverse, and/or prevent the damaging effects of metabolic syndrome.  Nutrition   Overview of the Pritikin Eating Plan  Clinical staff conducted group or individual video education with verbal and written material and guidebook.  Patient learns about the [place] for disease risk reduction. The [place] emphasizes a wide variety of unrefined, minimally-processed carbohydrates, like fruits, vegetables, whole grains, and legumes. Go, Caution, and Stop food choices are explained. Plant-based and lean animal proteins are emphasized. Rationale provided for low sodium intake for blood pressure control, low added sugars for blood sugar stabilization, and low added fats and oils for coronary artery disease risk reduction and weight management.  Calorie Density  Clinical staff conducted group or individual video education with verbal and written material and guidebook.  Patient learns about calorie density and how it impacts the Pritikin Eating Plan. Knowing the characteristics of the food you choose will  help you decide whether those foods will lead to weight gain or weight loss, and whether you want to consume more or less of them. Weight loss is usually a side effect of the Pritikin Eating Plan because of its focus on low calorie-dense foods.  Label Reading  Clinical staff conducted group or individual video education with verbal and written material and guidebook.  Patient learns about the Pritikin recommended label reading guidelines and corresponding recommendations regarding calorie density, added sugars, sodium content, and whole grains.  Dining Out - Part 1  Clinical staff conducted group or individual video education with verbal and written material and guidebook.  Patient learns that restaurant meals can be sabotaging  because they can be so high in calories, fat, sodium, and/or sugar. Patient learns recommended strategies on how to positively address this and avoid unhealthy pitfalls.  Facts on Fats  Clinical staff conducted group or individual video education with verbal and written material and guidebook.  Patient learns that lifestyle modifications can be just as effective, if not more so, as many medications for lowering your risk of heart disease. A Pritikin lifestyle can help to reduce your risk of inflammation and atherosclerosis (cholesterol build-up, or plaque, in the artery walls). Lifestyle interventions such as dietary choices and physical activity address the cause of atherosclerosis. A review of the types of fats and their impact on blood cholesterol levels, along with dietary recommendations to reduce fat intake is also included.  Nutrition Action Plan  Clinical staff conducted group or individual video education with verbal and written material and guidebook.  Patient learns how to incorporate Pritikin recommendations into their lifestyle. Recommendations include planning and keeping personal health goals in mind as an important part of their success.  Healthy Mind-Set     Healthy Minds, Bodies, Hearts  Clinical staff conducted group or individual video education with verbal and written material and guidebook.  Patient learns how to identify when they are stressed. Video will discuss the impact of that stress, as well as the many benefits of stress management. Patient will also be introduced to stress management techniques. The way we think, act, and feel has an impact on our hearts.  How Our Thoughts Can Heal Our Hearts  Clinical staff conducted group or individual video education with verbal and written material and guidebook.  Patient learns that negative thoughts can cause depression and anxiety. This can result in negative lifestyle behavior and serious health problems. Cognitive behavioral therapy is an effective method to help control our thoughts in order to change and improve our emotional outlook.  Additional Videos:  Exercise    Improving Performance  Clinical staff conducted group or individual video education with verbal and written material and guidebook.  Patient learns to use a non-linear approach by alternating intensity levels and lengths of time spent exercising to help burn more calories and lose more body fat. Cardiovascular exercise helps improve heart health, metabolism, hormonal balance, blood sugar control, and recovery from fatigue. Resistance training improves strength, endurance, balance, coordination, reaction time, metabolism, and muscle mass. Flexibility exercise improves circulation, posture, and balance. Seek guidance from your physician and exercise physiologist before implementing an exercise routine and learn your capabilities and proper form for all exercise.  Introduction to Yoga  Clinical staff conducted group or individual video education with verbal and written material and guidebook.  Patient learns about yoga, a discipline of the coming together of mind, breath, and body. The benefits of yoga include improved flexibility,  improved range of motion, better posture and core strength, increased lung function, weight loss, and positive self-image. Yoga's heart health benefits include lowered blood pressure, healthier heart rate, decreased cholesterol and triglyceride levels, improved immune function, and reduced stress. Seek guidance from your physician and exercise physiologist before implementing an exercise routine and learn your capabilities and proper form for all exercise.  Medical   Aging: Enhancing Your Quality of Life  Clinical staff conducted group or individual video education with verbal and written material and guidebook.  Patient learns key strategies and recommendations to stay in good physical health and enhance quality of life, such as prevention strategies, having an advocate, securing a Health Care Proxy and Power of  Attorney, and keeping a list of medications and system for tracking them. It also discusses how to avoid risk for bone loss.  Biology of Weight Control  Clinical staff conducted group or individual video education with verbal and written material and guidebook.  Patient learns that weight gain occurs because we consume more calories than we burn (eating more, moving less). Even if your body weight is normal, you may have higher ratios of fat compared to muscle mass. Too much body fat puts you at increased risk for cardiovascular disease, heart attack, stroke, type 2 diabetes, and obesity-related cancers. In addition to exercise, following the [place] can help reduce your risk.  Decoding Lab Results  Clinical staff conducted group or individual video education with verbal and written material and guidebook.  Patient learns that lab test reflects one measurement whose values change over time and are influenced by many factors, including medication, stress, sleep, exercise, food, hydration, pre-existing medical conditions, and more. It is recommended to use the knowledge from this  video to become more involved with your lab results and evaluate your numbers to speak with your doctor.   Diseases of Our Time - Overview  Clinical staff conducted group or individual video education with verbal and written material and guidebook.  Patient learns that according to the CDC, 50% to 70% of chronic diseases (such as obesity, type 2 diabetes, elevated lipids, hypertension, and heart disease) are avoidable through lifestyle improvements including healthier food choices, listening to satiety cues, and increased physical activity.  Sleep Disorders Clinical staff conducted group or individual video education with verbal and written material and guidebook.  Patient learns how good quality and duration of sleep are important to overall health and well-being. Patient also learns about sleep disorders and how they impact health along with recommendations to address them, including discussing with a physician.  Nutrition  Dining Out - Part 2 Clinical staff conducted group or individual video education with verbal and written material and guidebook.  Patient learns how to plan ahead and communicate in order to maximize their dining experience in a healthy and nutritious manner. Included are recommended food choices based on the type of restaurant the patient is visiting.   Fueling a Best boy conducted group or individual video education with verbal and written material and guidebook.  There is a strong connection between our food choices and our health. Diseases like obesity and type 2 diabetes are very prevalent and are in large-part due to lifestyle choices. The Pritikin Eating Plan provides plenty of food and hunger-curbing satisfaction. It is easy to follow, affordable, and helps reduce health risks.  Menu Workshop  Clinical staff conducted group or individual video education with verbal and written material and guidebook.  Patient learns that restaurant meals can  sabotage health goals because they are often packed with calories, fat, sodium, and sugar. Recommendations include strategies to plan ahead and to communicate with the manager, chef, or server to help order a healthier meal.  Planning Your Eating Strategy  Clinical staff conducted group or individual video education with verbal and written material and guidebook.  Patient learns about the [place] and its benefit of reducing the risk of disease. The [place] does not focus on calories. Instead, it emphasizes high-quality, nutrient-rich foods. By knowing the characteristics of the foods, we choose, we can determine their calorie density and make informed decisions.  Targeting Your Nutrition Priorities  Clinical staff conducted group or individual  video education with verbal and written material and guidebook.  Patient learns that lifestyle habits have a tremendous impact on disease risk and progression. This video provides eating and physical activity recommendations based on your personal health goals, such as reducing LDL cholesterol, losing weight, preventing or controlling type 2 diabetes, and reducing high blood pressure.  Vitamins and Minerals  Clinical staff conducted group or individual video education with verbal and written material and guidebook.  Patient learns different ways to obtain key vitamins and minerals, including through a recommended healthy diet. It is important to discuss all supplements you take with your doctor.   Healthy Mind-Set    Smoking Cessation  Clinical staff conducted group or individual video education with verbal and written material and guidebook.  Patient learns that cigarette smoking and tobacco addiction pose a serious health risk which affects millions of people. Stopping smoking will significantly reduce the risk of heart disease, lung disease, and many forms of cancer. Recommended strategies for quitting are covered, including  working with your doctor to develop a successful plan.  Culinary   Becoming a Financial trader conducted group or individual video education with verbal and written material and guidebook.  Patient learns that cooking at home can be healthy, cost-effective, quick, and puts them in control. Keys to cooking healthy recipes will include looking at your recipe, assessing your equipment needs, planning ahead, making it simple, choosing cost-effective seasonal ingredients, and limiting the use of added fats, salts, and sugars.  Cooking - Breakfast and Snacks  Clinical staff conducted group or individual video education with verbal and written material and guidebook.  Patient learns how important breakfast is to satiety and nutrition through the entire day. Recommendations include key foods to eat during breakfast to help stabilize blood sugar levels and to prevent overeating at meals later in the day. Planning ahead is also a key component.  Cooking - Human resources officer conducted group or individual video education with verbal and written material and guidebook.  Patient learns eating strategies to improve overall health, including an approach to cook more at home. Recommendations include thinking of animal protein as a side on your plate rather than center stage and focusing instead on lower calorie dense options like vegetables, fruits, whole grains, and plant-based proteins, such as beans. Making sauces in large quantities to freeze for later and leaving the skin on your vegetables are also recommended to maximize your experience.  Cooking - Healthy Salads and Dressing Clinical staff conducted group or individual video education with verbal and written material and guidebook.  Patient learns that vegetables, fruits, whole grains, and legumes are the foundations of the [place]. Recommendations include how to incorporate each of these in flavorful and healthy  salads, and how to create homemade salad dressings. Proper handling of ingredients is also covered. Cooking - Soups and [employer] - Soups and Desserts Clinical staff conducted group or individual video education with verbal and written material and guidebook.  Patient learns that Pritikin soups and desserts make for easy, nutritious, and delicious snacks and meal components that are low in sodium, fat, sugar, and calorie density, while high in vitamins, minerals, and filling fiber. Recommendations include simple and healthy ideas for soups and desserts.   Overview     The Pritikin Solution Program Overview Clinical staff conducted group or individual video education with verbal and written material and guidebook.  Patient learns that the results of the Pritikin Program  have been documented in more than 100 articles published in peer-reviewed journals, and the benefits include reducing risk factors for (and, in some cases, even reversing) high cholesterol, high blood pressure, type 2 diabetes, obesity, and more! An overview of the three key pillars of the Pritikin Program will be covered: eating well, doing regular exercise, and having a healthy mind-set.  WORKSHOPS  Exercise: Exercise Basics: Building Your Action Plan Clinical staff led group instruction and group discussion with PowerPoint presentation and patient guidebook. To enhance the learning environment the use of posters, models and videos may be added. At the conclusion of this workshop, patients will comprehend the difference between physical activity and exercise, as well as the benefits of incorporating both, into their routine. Patients will understand the FITT (Frequency, Intensity, Time, and Type) principle and how to use it to build an exercise action plan. In addition, safety concerns and other considerations for exercise and cardiac rehab will be addressed by the presenter. The purpose of this lesson is to promote a  comprehensive and effective weekly exercise routine in order to improve patients' overall level of fitness.   Managing Heart Disease: Your Path to a Healthier Heart Clinical staff led group instruction and group discussion with PowerPoint presentation and patient guidebook. To enhance the learning environment the use of posters, models and videos may be added.At the conclusion of this workshop, patients will understand the anatomy and physiology of the heart. Additionally, they will understand how Pritikin's three pillars impact the risk factors, the progression, and the management of heart disease.  The purpose of this lesson is to provide a high-level overview of the heart, heart disease, and how the Pritikin lifestyle positively impacts risk factors.  Exercise Biomechanics Clinical staff led group instruction and group discussion with PowerPoint presentation and patient guidebook. To enhance the learning environment the use of posters, models and videos may be added. Patients will learn how the structural parts of their bodies function and how these functions impact their daily activities, movement, and exercise. Patients will learn how to promote a neutral spine, learn how to manage pain, and identify ways to improve their physical movement in order to promote healthy living. The purpose of this lesson is to expose patients to common physical limitations that impact physical activity. Participants will learn practical ways to adapt and manage aches and pains, and to minimize their effect on regular exercise. Patients will learn how to maintain good posture while sitting, walking, and lifting.  Balance Training and Fall Prevention  Clinical staff led group instruction and group discussion with PowerPoint presentation and patient guidebook. To enhance the learning environment the use of posters, models and videos may be added. At the conclusion of this workshop, patients will understand the  importance of their sensorimotor skills (vision, proprioception, and the vestibular system) in maintaining their ability to balance as they age. Patients will apply a variety of balancing exercises that are appropriate for their current level of function. Patients will understand the common causes for poor balance, possible solutions to these problems, and ways to modify their physical environment in order to minimize their fall risk. The purpose of this lesson is to teach patients about the importance of maintaining balance as they age and ways to minimize their risk of falling.  WORKSHOPS   Nutrition:  Fueling a Scientist, research (physical sciences) led group instruction and group discussion with PowerPoint presentation and patient guidebook. To enhance the learning environment the use of posters, models and videos may  be added. Patients will review the foundational principles of the [place] and understand what constitutes a serving size in each of the food groups. Patients will also learn Pritikin-friendly foods that are better choices when away from home and review make-ahead meal and snack options. Calorie density will be reviewed and applied to three nutrition priorities: weight maintenance, weight loss, and weight gain. The purpose of this lesson is to reinforce (in a group setting) the key concepts around what patients are recommended to eat and how to apply these guidelines when away from home by planning and selecting Pritikin-friendly options. Patients will understand how calorie density may be adjusted for different weight management goals.  Mindful Eating  Clinical staff led group instruction and group discussion with PowerPoint presentation and patient guidebook. To enhance the learning environment the use of posters, models and videos may be added. Patients will briefly review the concepts of the [place] and the importance of low-calorie dense foods. The concept of mindful  eating will be introduced as well as the importance of paying attention to internal hunger signals. Triggers for non-hunger eating and techniques for dealing with triggers will be explored. The purpose of this lesson is to provide patients with the opportunity to review the basic principles of the [place], discuss the value of eating mindfully and how to measure internal cues of hunger and fullness using the Hunger Scale. Patients will also discuss reasons for non-hunger eating and learn strategies to use for controlling emotional eating.  Targeting Your Nutrition Priorities Clinical staff led group instruction and group discussion with PowerPoint presentation and patient guidebook. To enhance the learning environment the use of posters, models and videos may be added. Patients will learn how to determine their genetic susceptibility to disease by reviewing their family history. Patients will gain insight into the importance of diet as part of an overall healthy lifestyle in mitigating the impact of genetics and other environmental insults. The purpose of this lesson is to provide patients with the opportunity to assess their personal nutrition priorities by looking at their family history, their own health history and current risk factors. Patients will also be able to discuss ways of prioritizing and modifying the [place] for their highest risk areas  Menu  Clinical staff led group instruction and group discussion with PowerPoint presentation and patient guidebook. To enhance the learning environment the use of posters, models and videos may be added. Using menus brought in from [employer], or printed from [employer], patients will apply the [place] dining out guidelines that were presented in the [employer] video. Patients will also be able to practice these guidelines in a variety of provided scenarios. The purpose of this lesson is to provide  patients with the opportunity to practice hands-on learning of the [place] with actual menus and practice scenarios.  Label Reading Clinical staff led group instruction and group discussion with PowerPoint presentation and patient guidebook. To enhance the learning environment the use of posters, models and videos may be added. Patients will review and discuss the Pritikin label reading guidelines presented in Pritikin's Label Reading Educational series video. Using fool labels brought in from local grocery stores and markets, patients will apply the label reading guidelines and determine if the packaged food meet the Pritikin guidelines. The purpose of this lesson is to provide patients with the opportunity to review, discuss, and practice hands-on learning of the Pritikin Label Reading guidelines with  actual packaged food labels. [place] Workshops are designed to teach patients ways to prepare quick, simple, and affordable recipes at home. The importance of nutrition's role in chronic disease risk reduction is reflected in its emphasis in the overall Pritikin program. By learning how to prepare essential core Pritikin Eating Plan recipes, patients will increase control over what they eat; be able to customize the flavor of foods without the use of added salt, sugar, or fat; and improve the quality of the food they consume. By learning a set of core recipes which are easily assembled, quickly prepared, and affordable, patients are more likely to prepare more healthy foods at home. These workshops focus on convenient breakfasts, simple entres, side dishes, and desserts which can be prepared with minimal effort and are consistent with nutrition recommendations for cardiovascular risk reduction. Cooking [employer] are taught by a Engineer, materials (RD) who has been trained by the [employer]. The chef or RD has a clear  understanding of the importance of minimizing - if not completely eliminating - added fat, sugar, and sodium in recipes. Throughout the series of [place] Workshop sessions, patients will learn about healthy ingredients and efficient methods of cooking to build confidence in their capability to prepare    Cooking School weekly topics:  Adding Flavor- Sodium-Free  Fast and Healthy Breakfasts  Powerhouse Plant-Based Proteins  Satisfying Salads and Dressings  Simple Sides and Sauces  International Cuisine-Spotlight on the [place] Zones  Delicious Desserts  Savory Soups  [employer] - Meals in a Agricultural consultant Appetizers and Snacks  Comforting Weekend Breakfasts  One-Pot Wonders   Fast Evening Meals  Contractor Your Pritikin Plate  WORKSHOPS   Healthy Mindset (Psychosocial):  Focused Goals, Sustainable Changes Clinical staff led group instruction and group discussion with PowerPoint presentation and patient guidebook. To enhance the learning environment the use of posters, models and videos may be added. Patients will be able to apply effective goal setting strategies to establish at least one personal goal, and then take consistent, meaningful action toward that goal. They will learn to identify common barriers to achieving personal goals and develop strategies to overcome them. Patients will also gain an understanding of how our mind-set can impact our ability to achieve goals and the importance of cultivating a positive and growth-oriented mind-set. The purpose of this lesson is to provide patients with a deeper understanding of how to set and achieve personal goals, as well as the tools and strategies needed to overcome common obstacles which may arise along the way.  From Head to Heart: The Power of a Healthy Outlook  Clinical staff led group instruction and group discussion with PowerPoint presentation and patient guidebook. To enhance the learning  environment the use of posters, models and videos may be added. Patients will be able to recognize and describe the impact of emotions and mood on physical health. They will discover the importance of self-care and explore self-care practices which may work for them. Patients will also learn how to utilize the 4 C's to cultivate a healthier outlook and better manage stress and challenges. The purpose of this lesson is to demonstrate to patients how a healthy outlook is an essential part of maintaining good health, especially as they continue their cardiac rehab journey.  Healthy Sleep for a Healthy Heart Clinical staff led group instruction and group discussion with PowerPoint presentation and patient guidebook. To enhance the learning environment  the use of posters, models and videos may be added. At the conclusion of this workshop, patients will be able to demonstrate knowledge of the importance of sleep to overall health, well-being, and quality of life. They will understand the symptoms of, and treatments for, common sleep disorders. Patients will also be able to identify daytime and nighttime behaviors which impact sleep, and they will be able to apply these tools to help manage sleep-related challenges. The purpose of this lesson is to provide patients with a general overview of sleep and outline the importance of quality sleep. Patients will learn about a few of the most common sleep disorders. Patients will also be introduced to the concept of "sleep hygiene," and discover ways to self-manage certain sleeping problems through simple daily behavior changes. Finally, the workshop will motivate patients by clarifying the links between quality sleep and their goals of heart-healthy living.   Recognizing and Reducing Stress Clinical staff led group instruction and group discussion with PowerPoint presentation and patient guidebook. To enhance the learning environment the use of posters, models and videos  may be added. At the conclusion of this workshop, patients will be able to understand the types of stress reactions, differentiate between acute and chronic stress, and recognize the impact that chronic stress has on their health. They will also be able to apply different coping mechanisms, such as reframing negative self-talk. Patients will have the opportunity to practice a variety of stress management techniques, such as deep abdominal breathing, progressive muscle relaxation, and/or guided imagery.  The purpose of this lesson is to educate patients on the role of stress in their lives and to provide healthy techniques for coping with it.  Learning Barriers/Preferences:  Learning Barriers/Preferences - 10/10/22 1105       Learning Barriers/Preferences   Learning Barriers None    Learning Preferences Written Material;Video;Audio;Group Instruction;Individual Instruction;Computer/Internet;Verbal Instruction             Education Topics:  Knowledge Questionnaire Score:  Knowledge Questionnaire Score - 10/10/22 1222       Knowledge Questionnaire Score   Pre Score 21/24             Core Components/Risk Factors/Patient Goals at Admission:  Personal Goals and Risk Factors at Admission - 10/10/22 1105       Core Components/Risk Factors/Patient Goals on Admission    Weight Management Yes;Obesity;Weight Loss    Intervention Weight Management/Obesity: Establish reasonable short term and long term weight goals.;Obesity: Provide education and appropriate resources to help participant work on and attain dietary goals.    Admit Weight 248 lb 10.9 oz (112.8 kg)    Expected Outcomes Short Term: Continue to assess and modify interventions until short term weight is achieved;Weight Loss: Understanding of general recommendations for a balanced deficit meal plan, which promotes 1-2 lb weight loss per week and includes a negative energy balance of [phone] kcal/d    Hypertension Yes     Intervention Provide education on lifestyle modifcations including regular physical activity/exercise, weight management, moderate sodium restriction and increased consumption of fresh fruit, vegetables, and low fat dairy, alcohol moderation, and smoking cessation.;Monitor prescription use compliance.    Expected Outcomes Short Term: Continued assessment and intervention until BP is < 140/21mm HG in hypertensive participants. < 130/83mm HG in hypertensive participants with diabetes, heart failure or chronic kidney disease.;Long Term: Maintenance of blood pressure at goal levels.    Lipids Yes    Intervention Provide education and support for participant on nutrition & aerobic/resistive exercise  along with prescribed medications to achieve LDL 70mg , HDL >40mg .    Expected Outcomes Short Term: Participant states understanding of desired cholesterol values and is compliant with medications prescribed. Participant is following exercise prescription and nutrition guidelines.;Long Term: Cholesterol controlled with medications as prescribed, with individualized exercise RX and with personalized nutrition plan. Value goals: LDL < 70mg , HDL > 40 mg.    Stress Yes    Intervention Offer individual and/or small group education and counseling on adjustment to heart disease, stress management and health-related lifestyle change. Teach and support self-help strategies.;Refer participants experiencing significant psychosocial distress to appropriate mental health specialists for further evaluation and treatment. When possible, include family members and significant others in education/counseling sessions.    Expected Outcomes Short Term: Participant demonstrates changes in health-related behavior, relaxation and other stress management skills, ability to obtain effective social support, and compliance with psychotropic medications if prescribed.;Long Term: Emotional wellbeing is indicated by absence of clinically  significant psychosocial distress or social isolation.    Personal Goal Other Yes    Personal Goal Learn to eat better for better health. Be more active.    Intervention Patient will attend nutrition and exercise workshops/ video education. Provide individualized nutrition and exercise plan that patient can follow.    Expected Outcomes Patient will be compliant with nutrtion and exercise plan.             Core Components/Risk Factors/Patient Goals Review:    Core Components/Risk Factors/Patient Goals at Discharge (Final Review):    ITP Comments:  ITP Comments     Row Name 10/10/22 0931           ITP Comments Medical Director- Dr. Fransico Him, MD. [place] orientation folder with patient.                Comments: Participant attended orientation for the cardiac rehabilitation program on  10/10/2022  to perform initial intake and exercise walk test. Patient introduced to the [place] education and orientation packet was reviewed. Completed 6-minute walk test, measurements, initial ITP, and exercise prescription. Vital signs stable. Telemetry-normal sinus rhythm, asymptomatic.   Service time was from 931 to 1131.

## 2022-10-10 NOTE — Progress Notes (Signed)
Cardiac Rehab Medication Review  Does the patient  feel that his/her medications are working for him/her?  yes  Has the patient been experiencing any side effects to the medications prescribed?  no  Does the patient measure his/her own blood pressure or blood glucose at home?  no   Does the patient have any problems obtaining medications due to transportation or finances?   no  Understanding of regimen: good Understanding of indications: good Potential of compliance: good    Nurse comments: Esli says she has not picked up her Magnesium Gluconate prescription. Karin does not have a BP cuff at home to check her blood pressures    Harrell Gave RN 10/10/2022 2:49 PM

## 2022-10-12 ENCOUNTER — Ambulatory Visit (INDEPENDENT_AMBULATORY_CARE_PROVIDER_SITE_OTHER): Payer: Medicaid Other | Admitting: Nurse Practitioner

## 2022-10-12 ENCOUNTER — Encounter: Payer: Self-pay | Admitting: Nurse Practitioner

## 2022-10-12 VITALS — BP 130/82 | HR 75 | Temp 98.3°F | Ht 61.0 in | Wt 247.8 lb

## 2022-10-12 DIAGNOSIS — F331 Major depressive disorder, recurrent, moderate: Secondary | ICD-10-CM

## 2022-10-12 DIAGNOSIS — Z6841 Body Mass Index (BMI) 40.0 and over, adult: Secondary | ICD-10-CM

## 2022-10-12 DIAGNOSIS — Z1231 Encounter for screening mammogram for malignant neoplasm of breast: Secondary | ICD-10-CM | POA: Diagnosis not present

## 2022-10-12 MED ORDER — VORTIOXETINE HBR 5 MG PO TABS: 5.0000 mg | ORAL_TABLET | Freq: Every day | ORAL | 2 refills | Status: DC

## 2022-10-12 NOTE — Progress Notes (Signed)
I,Sheena H Holbrook,acting as a Education administrator for Minette Brine, FNP.,have documented all relevant documentation on the behalf of Minette Brine, FNP,as directed by  Minette Brine, FNP while in the presence of Minette Brine, [place].    Subjective:     Patient ID: [name] , female    DOB: 1975-07-26 , 47 y.o.   MRN: [id number]   Chief Complaint  Patient presents with   Depression    HPI  Patient presents today for possible depression. Patient reports she has been having issues with possible depression for about the past 4 years. She does have a history of anxiety. Patient has tried Effexor in the past, did not tolerate well, caused insomnia and increased depression. She is not currently on any medications for anxiety or depression.   She was in counseling in 2020 until Covid.   Depression        This is a chronic problem.  The current episode started more than 1 year ago.   The onset quality is gradual.   Associated symptoms include no decreased concentration and no fatigue.     The symptoms are aggravated by family issues and work stress.  Past treatments include SNRIs - Serotonin and norepinephrine reuptake inhibitors.  Compliance with prior treatments: side effects.  Risk factors include major life event and marital problems.     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 - states BP is under control; has been on med. x 12 yr.   Migraines    Right tennis elbow 11/21/2013     Family History  Problem Relation Age of Onset   Hypertension Mother    Diabetes Mother    Cancer Mother    Diabetes Brother    Hypertension Brother    Diabetes Brother    Hypertension Maternal Grandmother    Heart attack Paternal Grandmother    Cancer Other      Current Outpatient Medications:     amLODipine (NORVASC) 10 MG tablet, Take 1 tablet (10 mg total) by mouth daily., Disp: 90 tablet, Rfl: 0   atorvastatin (LIPITOR) 40 MG tablet, Take 40 mg by mouth daily., Disp: , Rfl:    clopidogrel (PLAVIX) 75 MG tablet, Take 1 tablet by mouth daily., Disp: , Rfl:    estradiol (ESTRACE) 2 MG tablet, Take 1 tablet (2 mg total) by mouth daily., Disp: 30 tablet, Rfl: 1   hydrochlorothiazide (HYDRODIURIL) 25 MG tablet, Take 1 tablet (25 mg total) by mouth every morning., Disp: 90 tablet, Rfl: 1   losartan (COZAAR) 100 MG tablet, Take 1 tablet (100 mg total) by mouth daily., Disp: 90 tablet, Rfl: 1   methocarbamol (ROBAXIN-750) 750 MG tablet, Take 1 tablet (750 mg total) by mouth 2 (two) times daily as needed for muscle spasms., Disp: 20 tablet, Rfl: 2   pantoprazole (PROTONIX) 40 MG tablet, Take 1 tablet (40 mg total) by mouth daily., Disp: 90 tablet, Rfl: 1   Vitamin D, Ergocalciferol, (DRISDOL) 1.25 MG (50000 UNIT) CAPS capsule, Take 1 capsule (50,000 Units total) by mouth every 7 (seven) days., Disp: 12 capsule, Rfl: 1   vortioxetine HBr (TRINTELLIX) 5 MG TABS tablet, Take 1 tablet (5 mg total) by mouth daily., Disp:  30 tablet, Rfl: 2   Magnesium Gluconate 250 MG TABS, Take 1 tablet (250 mg total) by mouth every evening. Take with evening meal (Patient not taking: Reported on 10/10/2022), Disp: 90 tablet, Rfl: 1   potassium chloride SA (KLOR-CON M) 20 MEQ tablet, Take 20 mEq by mouth 2 (two) times daily. (Patient not taking: Reported on 10/12/2022), Disp: , Rfl:    Allergies  Allergen Reactions   Venlafaxine Anaphylaxis and Nausea Only    Insomnia; Depression Insomnia; Depression      Review of Systems  Constitutional: Negative.  Negative for fatigue.  Respiratory: Negative.    Cardiovascular: Negative.   Neurological: Negative.   Psychiatric/Behavioral:  Positive for depression. Negative for decreased concentration.        Depressed     Today's Vitals   10/12/22 1510  BP: 130/82   Pulse: 75  Temp: 98.3 F (36.8 C)  TempSrc: Oral  SpO2: 97%  Weight: 247 lb 12.8 oz (112.4 kg)  Height: 5\' 1"  (1.549 m)   Body mass index is 46.82 kg/m.   Objective:  Physical Exam Vitals reviewed.  Constitutional:      General: She is not in acute distress.    Appearance: Normal appearance. She is obese.  Cardiovascular:     Rate and Rhythm: Normal rate and regular rhythm.     Pulses: Normal pulses.     Heart sounds: Normal heart sounds. No murmur heard. Pulmonary:     Effort: Pulmonary effort is normal. No respiratory distress.     Breath sounds: Normal breath sounds. No wheezing.  Skin:    General: Skin is warm and dry.     Capillary Refill: Capillary refill takes less than 2 seconds.  Neurological:     General: No focal deficit present.     Mental Status: She is alert and oriented to person, place, and time.     Cranial Nerves: No cranial nerve deficit.     Motor: No weakness.  Psychiatric:        Mood and Affect: Mood normal.        Behavior: Behavior normal.        Thought Content: Thought content normal.        Judgment: Judgment normal.         Assessment And Plan:     1. Moderate episode of recurrent major depressive disorder (HCC) Comments: Depression screen 14, started on Trintellix, Effexor had severe side effects. number for [facility]. Denies suicidal/homicidal ideations. I will also refer for counseling.  - vortioxetine HBr (TRINTELLIX) 5 MG TABS tablet; Take 1 tablet (5 mg total) by mouth daily.  Dispense: 30 tablet; Refill: 2 - Ambulatory referral to Psychology  2. Class 3 severe obesity due to excess calories with body mass index (BMI) of 45.0 to 49.9 in adult, unspecified whether serious comorbidity present [facility]) She is encouraged to strive for BMI less than 30 to decrease cardiac risk. Advised to aim for at least 150 minutes of exercise per week.  3. Encounter for screening mammogram for breast cancer Pt instructed on Self  Breast Exam.According to ACOG guidelines Women aged 47 and older are recommended to get an annual mammogram. Form completed and given to patient contact the [facility] for appointment scheduing.  Pt encouraged to get annual mammogram - MM Digital Screening; Future    Patient was given opportunity to ask questions. Patient verbalized understanding of the plan and was able to repeat key elements of the plan.  All questions were answered to their satisfaction.  Minette Brine, FNP   I, Minette Brine, FNP, have reviewed all documentation for this visit. The documentation on 10/12/22 for the exam, diagnosis, procedures, and orders are all accurate and complete.   IF YOU HAVE BEEN REFERRED TO A SPECIALIST, IT MAY TAKE 1-2 WEEKS TO SCHEDULE/PROCESS THE REFERRAL. IF YOU HAVE NOT HEARD FROM US/SPECIALIST IN TWO WEEKS, PLEASE GIVE [place] A CALL AT [phone] X [phone].   THE PATIENT IS ENCOURAGED TO PRACTICE SOCIAL DISTANCING DUE TO THE COVID-19 PANDEMIC.

## 2022-10-12 NOTE — Patient Instructions (Signed)
[facility] [address] [place], [place] [address] [place]: [phone] or [phone]

## 2022-10-16 ENCOUNTER — Encounter (HOSPITAL_COMMUNITY)
Admission: RE | Admit: 2022-10-16 | Discharge: 2022-10-16 | Disposition: A | Discharge status: Home / Self Care | Payer: Medicaid Other | Source: Ambulatory Visit | Attending: Cardiology | Admitting: Cardiology

## 2022-10-16 ENCOUNTER — Telehealth: Payer: Self-pay

## 2022-10-16 DIAGNOSIS — Z48812 Encounter for surgical aftercare following surgery on the circulatory system: Secondary | ICD-10-CM | POA: Diagnosis not present

## 2022-10-16 DIAGNOSIS — Z955 Presence of coronary angioplasty implant and graft: Secondary | ICD-10-CM

## 2022-10-16 NOTE — Progress Notes (Signed)
Daily Session Note  Patient Details  Name: TAQUANA WACHSMAN MRN: [id number] Date of Birth: 1975-08-16 Referring Provider:   Flowsheet Row CARDIAC REHAB PHASE II ORIENTATION from 10/10/2022 in [facility] [place]  Referring Provider Flossie Buffy, MD  Sueanne Margarita, MD]       Encounter Date: 10/16/2022  Check In:  Session Check In - 10/16/22 1423       Check-In   Supervising physician immediately available to respond to emergencies [facility] MD immediately available    Physician(s) Christen Bame, NP    Location [facility]    Staff Present Betha Loa, RN, MSN;Olinty Celesta Aver, MS, ACSM-CEP, Exercise Physiologist;Colton Tassin, RN, Milus Glazier, MS, ACSM-CEP, CCRP, Exercise Physiologist;Bailey Pearline Cables, MS, Exercise Physiologist;Jetta Walker BS, ACSM-CEP, Exercise Physiologist    Virtual Visit No    Medication changes reported     No    Fall or balance concerns reported    No    Tobacco Cessation No Change    Warm-up and Cool-down Performed as group-led instruction    Resistance Training Performed Yes    VAD Patient? No    PAD/SET Patient? No      Pain Assessment   Currently in Pain? No/denies    Pain Score 0-No pain    Multiple Pain Sites No             Capillary Blood Glucose: No results found for this or any previous visit (from the past 24 hour(s)).   Exercise Prescription Changes - 10/16/22 1631       Response to Exercise   Blood Pressure (Admit) 114/74    Blood Pressure (Exercise) 128/80    Blood Pressure (Exit) 112/66    Heart Rate (Admit) 78 bpm    Heart Rate (Exercise) 107 bpm    Heart Rate (Exit) 86 bpm    Rating of Perceived Exertion (Exercise) 11.5    Perceived Dyspnea (Exercise) 0    Symptoms 0    Comments Pt first day in teh CRP2 program    Duration Progress to 30 minutes of  aerobic without signs/symptoms of physical distress    Intensity THRR unchanged      Progression    Progression Continue to progress workloads to maintain intensity without signs/symptoms of physical distress.    Average METs 2.3      Resistance Training   Training Prescription Yes    Weight 2 lbs    Reps 10-15    Time 10 Minutes      Treadmill   MPH 1.7    Grade 0    Minutes 15    METs 2.3      NuStep   Level 2    Minutes 15    METs 1.8             Social History   Tobacco Use  Smoking Status Never  Smokeless Tobacco Never    Goals Met:  Exercise tolerated well No report of concerns or symptoms today Strength training completed today  Goals Unmet:  Not Applicable  Comments: Pt started cardiac rehab today.  Pt tolerated light exercise without difficulty. VSS, telemetry-Sinus Rhythm, asymptomatic.  Medication list reconciled. Pt denies barriers to medicaiton compliance.  PSYCHOSOCIAL ASSESSMENT:  PHQ-10. Pt admits to being depressed saw PCP last week has been referred for counseling.    Pt enjoys crafting and making cakes.   Pt oriented to exercise equipment and routine.    Understanding  verbalized. Harrell Gave RN BSN    Dr. Fransico Him is Medical Director for Cardiac Rehab at [facility].

## 2022-10-16 NOTE — Telephone Encounter (Signed)
Per [employer] plan does NOT cover Trintellix.   PA started via CMM Key: [id number]  Has the member had an inadequate response to TWO preferred antidepressants after a minimum 30-day trial with each? Preferred antidepressant medications include: bupropion/SR/XL tablet (generic for Wellbutrin), desvenlafaxine ER tablet (generic for Pristiq), duloxetine capsule (generic for Cymbalta), Effexor XR capsule, mirtazapine ODT/tablet (generic for Remeron), Nardil tablet, phenelzine tablet (generic for Nardil), Pristiq ER tablet, tranylcypromine tablet (generic for Parnate), trazodone tablet (generic for Desyrel), venlafaxine tablet/ER capsule (generic for Effexor/ER), Viibryd tablet  Per chart patient has not previously tried any of these medications for 3 months. Answered PA questions accordingly. Decision pending.

## 2022-10-17 MED ORDER — BUPROPION HCL ER (XL) 150 MG PO TB24: 150.0000 mg | ORAL_TABLET | Freq: Every day | ORAL | 2 refills | Status: DC

## 2022-10-17 NOTE — Addendum Note (Signed)
Addended by: Wadie Lessen on: 10/17/2022 11:52 AM   Modules accepted: Orders

## 2022-10-17 NOTE — Telephone Encounter (Signed)
Outcome Denied on March 25 Denied.   Per the health plan's preferred drug list, at least 2 preferred drugs must be tried before requesting this drug or tell us why the member cannot try any preferred alternatives. Please send [place] supporting chart notes and lab results. Here is list of preferred alternatives: bupropion tablet / SR tablet / XL tablet (generic for Wellbutrin Tablet / SR / XL), desvenlafaxine ER tablet (generic for Pristiq), duloxetine capsule (generic for Cymbalta), Effexor XR Capsule, mirtazapine ODT / tablet, Nardil Tablet, phenelzine tablet, Pristiq ER Tablet, tranylcypromine tablet, trazodone tablet, venlafaxine tablet / ER capsules, Viibryd tablet. Note: Some preferred drug(s) may have quantity limits. Refer to the health plan's preferred drug list for additional details.

## 2022-10-17 NOTE — Telephone Encounter (Signed)
Send Rx for wellbutrin 150 mg daily - I do not see a history of seizures but confirm no history.

## 2022-10-17 NOTE — Telephone Encounter (Signed)
Spoke with patient and advised of Trintellix denial. She reports no history of seizures. She is aware to start Wellbutrin and to contact us in about a month to let us know how she is doing. Rx sent to pharmacy.

## 2022-10-18 ENCOUNTER — Encounter (HOSPITAL_COMMUNITY): Payer: Medicaid Other

## 2022-10-20 ENCOUNTER — Encounter (HOSPITAL_COMMUNITY)
Admission: RE | Admit: 2022-10-20 | Discharge: 2022-10-20 | Disposition: A | Discharge status: Home / Self Care | Payer: Medicaid Other | Source: Ambulatory Visit | Attending: Cardiology | Admitting: Cardiology

## 2022-10-20 ENCOUNTER — Ambulatory Visit
Admission: RE | Admit: 2022-10-20 | Discharge: 2022-10-20 | Disposition: A | Discharge status: Home / Self Care | Payer: Medicaid Other | Source: Ambulatory Visit | Attending: Nurse Practitioner | Admitting: Nurse Practitioner

## 2022-10-20 DIAGNOSIS — Z1231 Encounter for screening mammogram for malignant neoplasm of breast: Secondary | ICD-10-CM

## 2022-10-20 DIAGNOSIS — Z48812 Encounter for surgical aftercare following surgery on the circulatory system: Secondary | ICD-10-CM | POA: Diagnosis not present

## 2022-10-20 DIAGNOSIS — Z955 Presence of coronary angioplasty implant and graft: Secondary | ICD-10-CM

## 2022-10-20 NOTE — Progress Notes (Signed)
QUALITY OF LIFE SCORE REVIEW  Pt completed Quality of Life survey as a participant in Cardiac Rehab.  Scores 21.0 or below are considered low.  Pt score very low in several areas Overall 1903, Health and Function 19.17, socioeconomic 23.86, physiological and spiritual 14.64, family 101. Patient quality of life slightly altered by physical constraints which limits ability to perform as prior to recent cardiac illness. [name] was started on an antidepressant by her PCP [name]. [name] says she is supposed to be receiving a call about getting counseling.  Offered emotional support and reassurance.  Will continue to monitor and intervene as necessary. Harrell Gave RN BSN

## 2022-10-23 ENCOUNTER — Encounter (HOSPITAL_COMMUNITY)
Admission: RE | Admit: 2022-10-23 | Discharge: 2022-10-23 | Disposition: A | Discharge status: Home / Self Care | Payer: Medicaid Other | Source: Ambulatory Visit | Attending: Cardiology | Admitting: Cardiology

## 2022-10-23 DIAGNOSIS — Z955 Presence of coronary angioplasty implant and graft: Secondary | ICD-10-CM | POA: Diagnosis present

## 2022-10-25 ENCOUNTER — Encounter (HOSPITAL_COMMUNITY): Payer: Medicaid Other

## 2022-10-27 ENCOUNTER — Telehealth (HOSPITAL_COMMUNITY): Payer: Self-pay | Admitting: *Deleted

## 2022-10-27 ENCOUNTER — Encounter (HOSPITAL_COMMUNITY): Payer: Medicaid Other

## 2022-10-30 ENCOUNTER — Encounter (HOSPITAL_COMMUNITY)
Admission: RE | Admit: 2022-10-30 | Discharge: 2022-10-30 | Disposition: A | Discharge status: Home / Self Care | Payer: Medicaid Other | Source: Ambulatory Visit | Attending: Cardiology | Admitting: Cardiology

## 2022-10-30 DIAGNOSIS — Z955 Presence of coronary angioplasty implant and graft: Secondary | ICD-10-CM | POA: Diagnosis not present

## 2022-10-30 NOTE — Progress Notes (Signed)
CARDIAC REHAB PHASE 2  Reviewed home exercise with pt today. Pt is tolerating exercise well. Pt will continue to exercise on her own by doing PT exercises, chair fitness classes on Youtube and some light hand weights for 30 minutes (as tolerated by fatigue and back pain) per session 2-4 days a week in addition to the 3 days in CRP2. Advised pt on THRR, RPE scale, hydration and temperature/humidity precautions. Reinforced S/S to stop exercise and when to call MD vs 911. Encouraged warm up cool down and stretches with exercise sessions. Pt verbalized understanding, all questions were answered and pt was given a copy to take home.    Harrie Jeans ACSM-CEP 10/30/2022 5:13 PM

## 2022-11-01 ENCOUNTER — Encounter (HOSPITAL_COMMUNITY): Payer: Medicaid Other

## 2022-11-03 ENCOUNTER — Encounter (HOSPITAL_COMMUNITY)
Admission: RE | Admit: 2022-11-03 | Discharge: 2022-11-03 | Disposition: A | Discharge status: Home / Self Care | Payer: Medicaid Other | Source: Ambulatory Visit | Attending: Cardiology | Admitting: Cardiology

## 2022-11-03 DIAGNOSIS — Z955 Presence of coronary angioplasty implant and graft: Secondary | ICD-10-CM

## 2022-11-03 NOTE — Progress Notes (Signed)
Cardiac Individual Treatment Plan  Patient Details  Name: [name] MRN: [id number] Date of Birth: 1976-05-21 Referring Provider:   Flowsheet Row CARDIAC REHAB PHASE II ORIENTATION from 10/10/2022 in [facility]  Referring Provider Diamond Nickel, MD  Quintella Reichert, MD]       Initial Encounter Date:  Flowsheet Row CARDIAC REHAB PHASE II ORIENTATION from 10/10/2022 in [facility]  Date 10/10/22       Visit Diagnosis: 06/13/22 DES LAD  Patient's Home Medications on Admission:  Current Outpatient Medications:    amLODipine (NORVASC) 10 MG tablet, Take 1 tablet (10 mg total) by mouth daily., Disp: 90 tablet, Rfl: 0   atorvastatin (LIPITOR) 40 MG tablet, Take 40 mg by mouth daily., Disp: , Rfl:    buPROPion (WELLBUTRIN XL) 150 MG 24 hr tablet, Take 1 tablet (150 mg total) by mouth daily., Disp: 30 tablet, Rfl: 2   clopidogrel (PLAVIX) 75 MG tablet, Take 1 tablet by mouth daily., Disp: , Rfl:    estradiol (ESTRACE) 2 MG tablet, Take 1 tablet (2 mg total) by mouth daily., Disp: 30 tablet, Rfl: 1   hydrochlorothiazide (HYDRODIURIL) 25 MG tablet, Take 1 tablet (25 mg total) by mouth every morning., Disp: 90 tablet, Rfl: 1   losartan (COZAAR) 100 MG tablet, Take 1 tablet (100 mg total) by mouth daily., Disp: 90 tablet, Rfl: 1   Magnesium Gluconate 250 MG TABS, Take 1 tablet (250 mg total) by mouth every evening. Take with evening meal (Patient not taking: Reported on 10/10/2022), Disp: 90 tablet, Rfl: 1   methocarbamol (ROBAXIN-750) 750 MG tablet, Take 1 tablet (750 mg total) by mouth 2 (two) times daily as needed for muscle spasms., Disp: 20 tablet, Rfl: 2   pantoprazole (PROTONIX) 40 MG tablet, Take 1 tablet (40 mg total) by mouth daily., Disp: 90 tablet, Rfl: 1   potassium chloride SA (KLOR-CON M) 20 MEQ tablet, Take 20 mEq by mouth 2 (two) times daily. 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 - states BP is under control; has been on med. x 12 yr.   Migraines    Right tennis elbow 11/21/2013    Tobacco Use: Social History   Tobacco Use  Smoking Status Never  Smokeless Tobacco Never    Labs: Review Flowsheet       Latest Ref Rng & Units 12/12/2007 08/01/2021 05/03/2022 09/04/2022  Labs for ITP Cardiac and Pulmonary Rehab  Cholestrol 100 - 199 mg/dL - - - 010   LDL (calc) 0 - 99 mg/dL - - - 75   HDL-C >27 mg/dL - - - 44   Trlycerides 0 - 149 mg/dL - - - 253   Hemoglobin A1c 4.8 - 5.6 % - 6.1  5.8  6.1   TCO2 - 22  - - -    Capillary Blood Glucose: No  results found for: "GLUCAP"   Exercise Target Goals: Exercise Program Goal: Individual exercise prescription set using results from initial 6 min walk test and THRR while considering  patient's activity barriers and safety.   Exercise Prescription Goal: Initial exercise prescription builds to 30-45 minutes a day of aerobic activity, 2-3 days per week.  Home exercise guidelines will be given to patient during program as part of exercise prescription that the participant will acknowledge.  Activity Barriers & Risk Stratification:  Activity Barriers & Cardiac Risk Stratification - 10/10/22 1331       Activity Barriers & Cardiac Risk Stratification   Activity Barriers Neck/Spine Problems;Other (comment)    Comments Spinal stenosis-chronic pain, lamenectomy L4-L5, no overhead press. Bilateral carpal tunnel release.    Cardiac Risk  Stratification Moderate             6 Minute Walk:  6 Minute Walk     Row Name 10/10/22 1119         6 Minute Walk   Phase Initial     Distance 1197 feet     Walk Time 6 minutes     # of Rest Breaks 0     MPH 2.27     METS 3.03     RPE 10     Perceived Dyspnea  1     VO2 Peak 10.59     Symptoms Yes (comment)     Comments Mild SOB, RPD=1     Resting HR 69 bpm     Resting BP 128/90     Resting Oxygen Saturation  99 %     Exercise Oxygen Saturation  during 6 min walk 98 %     Max Ex. HR 86 bpm     Max Ex. BP 138/84     2 Minute Post BP 122/88              Oxygen Initial Assessment:   Oxygen Re-Evaluation:   Oxygen Discharge (Final Oxygen Re-Evaluation):   Initial Exercise Prescription:  Initial Exercise Prescription - 10/10/22 1300       Date of Initial Exercise RX and Referring Provider   Date 10/10/22    Referring Provider Diamond Nickel, MD   Quintella Reichert, MD   Expected Discharge Date 12/12/22      Treadmill   MPH 1.7    Grade 0    Minutes 15    METs 2.3      NuStep   Level 2    SPM 80    Minutes 15    METs 2.2      Prescription Details   Frequency (times per week) 2    Duration Progress to 30 minutes of continuous aerobic without signs/symptoms of physical distress      Intensity   THRR 40-80% of Max Heartrate 70-141    Ratings of Perceived Exertion 11-13    Perceived Dyspnea 0-4      Progression   Progression Continue to progress workloads to maintain intensity without signs/symptoms of physical distress.      Resistance Training   Training Prescription Yes    Weight 2 lbs    Reps 10-15             Perform Capillary Blood Glucose checks as needed.  Exercise Prescription Changes:   Exercise Prescription Changes     Row Name 10/16/22 1631 10/30/22 1706           Response to Exercise   Blood Pressure (Admit)  114/74 120/76      Blood Pressure (Exercise) 128/80 154/80      Blood Pressure (Exit) 112/66 120/78       Heart Rate (Admit) 78 bpm 65 bpm      Heart Rate (Exercise) 107 bpm 111 bpm      Heart Rate (Exit) 86 bpm 76 bpm      Rating of Perceived Exertion (Exercise) 11.5 12      Perceived Dyspnea (Exercise) 0 0      Symptoms 0 0      Comments Pt first day in teh CRP2 program Reviewed MET's, goals and home ExRx      Duration Progress to 30 minutes of  aerobic without signs/symptoms of physical distress Progress to 30 minutes of  aerobic without signs/symptoms of physical distress      Intensity THRR unchanged THRR unchanged        Progression   Progression Continue to progress workloads to maintain intensity without signs/symptoms of physical distress. Continue to progress workloads to maintain intensity without signs/symptoms of physical distress.      Average METs 2.3 2.1        Resistance Training   Training Prescription Yes Yes      Weight 2 lbs 2 lbs      Reps 10-15 10-15      Time 10 Minutes 10 Minutes        Treadmill   MPH 1.7 1.7      Grade 0 0      Minutes 15 15      METs 2.3 2.3        NuStep   Level 2 2      SPM -- 80      Minutes 15 15      METs 1.8 1.9        Home Exercise Plan   Plans to continue exercise at -- Home (comment)      Frequency -- Add 4 additional days to program exercise sessions.      Initial Home Exercises Provided -- 10/30/22               Exercise Comments:   Exercise Comments     Row Name 10/16/22 1637 10/30/22 1713         Exercise Comments Pt first day in the CRP2 program. Pt tolerated exercise well with an average MET level of 2.3. Pt is learning her THRR, RPE and ExRx. Will continue to monitor pt and progress workloads as tolerated without sign or symptom Reviewed MET's, goals and homeExRx. Pt tolerated exercise well with an average MET level of 2.1. Pt feels good about her goals of being more active, and she has been working on eating healthier and adding in more veggies. Overall pt feels good about her progress and about adopting  a healthier lifestyle. Pt has already been doing PT exercises at home daily for about 15 mins, but encourage adding in more time by trying some yourtube chair fitness classes or light handweights on some of the days when shes not here so that she get in 5-7 days of 30 mins of physical activity               Exercise Goals and Review:   Exercise Goals     Row Name 10/10/22 1105             Exercise Goals   Increase Physical Activity Yes       Intervention Provide advice, education,  support and counseling about physical activity/exercise needs.;Develop an individualized exercise prescription for aerobic and resistive training based on initial evaluation findings, risk stratification, comorbidities and participant's personal goals.       Expected Outcomes Short Term: Attend rehab on a regular basis to increase amount of physical activity.;Long Term: Add in home exercise to make exercise part of routine and to increase amount of physical activity.;Long Term: Exercising regularly at least 3-5 days a week.       Increase Strength and Stamina Yes       Intervention Provide advice, education, support and counseling about physical activity/exercise needs.;Develop an individualized exercise prescription for aerobic and resistive training based on initial evaluation findings, risk stratification, comorbidities and participant's personal goals.       Expected Outcomes Short Term: Increase workloads from initial exercise prescription for resistance, speed, and METs.;Short Term: Perform resistance training exercises routinely during rehab and add in resistance training at home;Long Term: Improve cardiorespiratory fitness, muscular endurance and strength as measured by increased METs and functional capacity ( )       Able to understand and use rate of perceived exertion (RPE) scale Yes       Intervention Provide education and explanation on how to use RPE scale       Expected Outcomes Short Term: Able  to use RPE daily in rehab to express subjective intensity level;Long Term:  Able to use RPE to guide intensity level when exercising independently       Knowledge and understanding of Target Heart Rate Range (THRR) Yes       Intervention Provide education and explanation of THRR including how the numbers were predicted and where they are located for reference       Expected Outcomes Short Term: Able to state/look up THRR;Long Term: Able to use THRR to govern intensity when exercising independently;Short Term: Able to use daily as guideline for intensity in rehab       Able to check pulse independently Yes       Intervention Provide education and demonstration on how to check pulse in carotid and radial arteries.;Review the importance of being able to check your own pulse for safety during independent exercise       Expected Outcomes Short Term: Able to explain why pulse checking is important during independent exercise;Long Term: Able to check pulse independently and accurately       Understanding of Exercise Prescription Yes       Intervention Provide education, explanation, and written materials on patient's individual exercise prescription       Expected Outcomes Short Term: Able to explain program exercise prescription;Long Term: Able to explain home exercise prescription to exercise independently                Exercise Goals Re-Evaluation :  Exercise Goals Re-Evaluation     Row Name 10/16/22 1635 10/30/22 1709           Exercise Goal Re-Evaluation   Exercise Goals Review Increase Physical Activity;Understanding of Exercise Prescription;Increase Strength and Stamina;Knowledge and understanding of Target Heart Rate Range (THRR);Able to understand and use rate of perceived exertion (RPE) scale Increase Physical Activity;Understanding of Exercise Prescription;Increase Strength and Stamina;Knowledge and understanding of Target Heart Rate Range (THRR);Able to understand and use rate of  perceived exertion (RPE) scale      Comments Pt first day in the CRP2 program. Pt tolerated exercise well with an average MET level of 2.3. Pt is learning her THRR, RPE and ExRx Reviewed  MET's, goals and homeExRx. Pt tolerated exercise well with an average MET level of 2.1. Pt feels good about her goals of being more active, and she has been working on eating healthier and adding in more veggies. Overall pt feels good about her progress and about adopting a healthier lifestyle. Pt has already been doing PT exercises at home daily for about 15 mins, but encourage adding in more time by trying some yourtube chair fitness classes or light handweights on some of the days when shes not here so that she get in 5-7 days of 30 mins of physical activity      Expected Outcomes Will continue to monitor pt and progress workloads as tolerated without sign or symptom Will continue to monitor pt and progress workloads as tolerated without sign or symptom               Discharge Exercise Prescription (Final Exercise Prescription Changes):  Exercise Prescription Changes - 10/30/22 1706       Response to Exercise   Blood Pressure (Admit) 120/76    Blood Pressure (Exercise) 154/80    Blood Pressure (Exit) 120/78    Heart Rate (Admit) 65 bpm    Heart Rate (Exercise) 111 bpm    Heart Rate (Exit) 76 bpm    Rating of Perceived Exertion (Exercise) 12    Perceived Dyspnea (Exercise) 0    Symptoms 0    Comments Reviewed MET's, goals and home ExRx    Duration Progress to 30 minutes of  aerobic without signs/symptoms of physical distress    Intensity THRR unchanged      Progression   Progression Continue to progress workloads to maintain intensity without signs/symptoms of physical distress.    Average METs 2.1      Resistance Training   Training Prescription Yes    Weight 2 lbs    Reps 10-15    Time 10 Minutes      Treadmill   MPH 1.7    Grade 0    Minutes 15    METs 2.3      NuStep   Level 2     SPM 80    Minutes 15    METs 1.9      Home Exercise Plan   Plans to continue exercise at Home (comment)    Frequency Add 4 additional days to program exercise sessions.    Initial Home Exercises Provided 10/30/22             Nutrition:  Target Goals: Understanding of nutrition guidelines, daily intake of sodium 1500mg , cholesterol 200mg , calories 30% from fat and 7% or less from saturated fats, daily to have 5 or more servings of fruits and vegetables.  Biometrics:  Pre Biometrics - 10/10/22 0931       Pre Biometrics   Waist Circumference 52 inches    Hip Circumference 59.5 inches    Waist to Hip Ratio 0.87 %    Triceps Skinfold 45 mm    % Body Fat 56.8 %    Grip Strength 28 kg    Flexibility 10.5 in    Single Leg Stand 30 seconds              Nutrition Therapy Plan and Nutrition Goals:  Nutrition Therapy & Goals - 10/11/22 1108       Nutrition Therapy   Diet Heart healthy diet    Drug/Food Interactions Statins/Certain Fruits      Personal Nutrition Goals   Nutrition Goal Patient  to identify strategies for reducing cardiovascular risk by attending the [employer] education and nutrition series weekly.    Personal Goal #2 Patient to improve diet quality by using the plate method as a guide for meal planning to include lean protein/plant protein, fruits, vegetables, whole grains, nonfat dairy as part of a well-balanced diet.    Personal Goal #3 Patient to identify strategies for weight loss of 0.5-2.0# per week.    Personal Goal #4 Patient to limit sodium to 1500mg  per day    Comments [name] has been working with [facility] in preparation for future bariatric surgery ([facility]) She will continue to benefit from participation in intensive cardiac rehab for nutrition, exercise, and lifestyle modification.      Intervention Plan   Intervention Prescribe, educate and counsel regarding individualized specific dietary modifications aiming towards targeted core  components such as weight, hypertension, lipid management, diabetes, heart failure and other comorbidities.;Nutrition handout(s) given to patient.    Expected Outcomes Short Term Goal: Understand basic principles of dietary content, such as calories, fat, sodium, cholesterol and nutrients.;Long Term Goal: Adherence to prescribed nutrition plan.             Nutrition Assessments:  Nutrition Assessments - 10/11/22 1112       Rate Your Plate Scores   Pre Score 60            MEDIFICTS Score Key: ?70 Need to make dietary changes  40-70 Heart Healthy Diet ? 40 Therapeutic Level Cholesterol Diet   Flowsheet Row CARDIAC REHAB PHASE II ORIENTATION from 10/10/2022 in [facility]  Picture Your Plate Total Score on Admission 60      Picture Your Plate Scores: <57 Unhealthy dietary pattern with much room for improvement. 41-50 Dietary pattern unlikely to meet recommendations for good health and room for improvement. 51-60 More healthful dietary pattern, with some room for improvement.  >60 Healthy dietary pattern, although there may be some specific behaviors that could be improved.    Nutrition Goals Re-Evaluation:  Nutrition Goals Re-Evaluation     Row Name 10/11/22 1108             Goals   Comment A1c 6.1, triglycerides 176       Expected Outcome [name] has been working with [facility] in preparation for future bariatric surgery. She will continue to benefit from participation in intensive cardiac rehab for nutrition, exercise, and lifestyle modification.                Nutrition Goals Re-Evaluation:  Nutrition Goals Re-Evaluation     Row Name 10/11/22 1108             Goals   Comment A1c 6.1, triglycerides 176       Expected Outcome [name] has been working with [facility] in preparation for future bariatric surgery. She will continue to benefit from participation in intensive  cardiac rehab for nutrition, exercise, and lifestyle modification.                Nutrition Goals Discharge (Final Nutrition Goals Re-Evaluation):  Nutrition Goals Re-Evaluation - 10/11/22 1108       Goals   Comment A1c 6.1, triglycerides 176    Expected Outcome [name] has been working with [facility] in preparation for future bariatric surgery. She will continue to benefit from participation in intensive cardiac rehab for nutrition, exercise, and lifestyle modification.  Psychosocial: Target Goals: Acknowledge presence or absence of significant depression and/or stress, maximize coping skills, provide positive support system. Participant is able to verbalize types and ability to use techniques and skills needed for reducing stress and depression.  Initial Review & Psychosocial Screening:  Initial Psych Review & Screening - 10/10/22 1456       Initial Review   Current issues with Current Depression      Family Dynamics   Good Support System? Yes   [name] has her 3 children for support. [name] lives with her family   Comments [name] reports that she has been experiencing some depression. Has received counselling in the past prior to the COVID 19 pandemic. Appointment made for [name] to discuss current depression with PCP      Barriers   Psychosocial barriers to participate in program The patient should benefit from training in stress management and relaxation.      Screening Interventions   Interventions Encouraged to exercise;To provide support and resources with identified psychosocial needs;Provide feedback about the scores to participant    Expected Outcomes Long Term Goal: Stressors or current issues are controlled or eliminated.;Short Term goal: Utilizing psychosocial counselor, staff and physician to assist with identification of specific Stressors or current issues interfering with healing process. Setting desired goal for each stressor or current  issue identified.;Short Term goal: Identification and review with participant of any Quality of Life or Depression concerns found by scoring the questionnaire.;Long Term goal: The participant improves quality of Life and PHQ9 Scores as seen by post scores and/or verbalization of changes             Quality of Life Scores:  Quality of Life - 10/10/22 1218       Quality of Life   Select Quality of Life      Quality of Life Scores   Health/Function Pre 19.17 %    Socioeconomic Pre 23.86 %    Psych/Spiritual Pre 14.64 %    Family Pre 18 %    GLOBAL Pre 19.03 %            Scores of 19 and below usually indicate a poorer quality of life in these areas.  A difference of  2-3 points is a clinically meaningful difference.  A difference of 2-3 points in the total score of the Quality of Life Index has been associated with significant improvement in overall quality of life, self-image, physical symptoms, and general health in studies assessing change in quality of life.  PHQ-9: Review Flowsheet  More data exists      10/12/2022 10/10/2022 09/04/2022 05/03/2022 03/06/2022  Depression screen PHQ 2/9  Decreased Interest 2 2 0 0 0 0 0  Down, Depressed, Hopeless 2 2 0 0 0 0 0  PHQ - 2 Score 4 4 0 0 0 0 0  Altered sleeping 3 2 3  0 -  Tired, decreased energy 3 2 1  0 -  Change in appetite 0 0 0 0 -  Feeling bad or failure about yourself  1 1 0 0 -  Trouble concentrating 3 1 0 0 -  Moving slowly or fidgety/restless 0 0 0 0 -  Suicidal thoughts 0 0 0 0 -  PHQ-9 Score 14 10 4  0 -  Difficult doing work/chores Somewhat difficult Somewhat difficult Not difficult at all - -   Interpretation of Total Score  Total Score Depression Severity:  1-4 = Minimal depression, 5-9 = Mild depression, 10-14 = Moderate depression, 15-19 = Moderately  severe depression, 20-27 = Severe depression   Psychosocial Evaluation and Intervention:   Psychosocial Re-Evaluation:  Psychosocial Re-Evaluation     Row  Name 10/17/22 [phone] 11/03/22 1718           Psychosocial Re-Evaluation   Current issues with Current Depression Current Depression      Comments [name] saw her PCP last week to discuss current state of depression was prescribed an antidepressant. Will review quality of life questionnaire, PHQ2-9 in the upcoing week. Quality of life questionnaire reviewed and was forwarded to her PCP. [name] has started taking an antidepressant she has not noticed any difference yet. Encouraged [name] to call her PCP about getting counselling she has not been scheduled yet      Expected Outcomes [name] will have decreased or controlled depression upon completion of intensive cardiac rehab [name] will have decreased or controlled depression upon completion of intensive cardiac rehab      Interventions Stress management education;Encouraged to attend Cardiac Rehabilitation for the exercise;Relaxation education;Physician referral Stress management education;Encouraged to attend Cardiac Rehabilitation for the exercise;Relaxation education;Physician referral      Continue Psychosocial Services  Follow up required by staff Follow up required by staff               Psychosocial Discharge (Final Psychosocial Re-Evaluation):  Psychosocial Re-Evaluation - 11/03/22 1718       Psychosocial Re-Evaluation   Current issues with Current Depression    Comments Quality of life questionnaire reviewed and was forwarded to her PCP. Aspynn has started taking an antidepressant she has not noticed any difference yet. Encouraged [name] to call her PCP about getting counselling she has not been scheduled yet    Expected Outcomes [name] will have decreased or controlled depression upon completion of intensive cardiac rehab    Interventions Stress management education;Encouraged to attend Cardiac Rehabilitation for the exercise;Relaxation education;Physician referral    Continue Psychosocial Services  Follow up required by staff              Vocational Rehabilitation: Provide vocational rehab assistance to qualifying candidates.   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 Participant will state understanding/return demonstration of topics presented.    Education     Row Name 10/16/22 1600     Education   Cardiac Education Topics Pritikin   Glass blower/designer Nutrition   Nutrition Workshop Label Reading   Instruction Review Code 1- Verbalizes Understanding   Class Start Time 1400   Class Stop Time 1445   Class Time Calculation (min) 45 min    Row Name 10/20/22 0853     Education   Cardiac Education Topics Pritikin   Select Core Videos     Core Videos   Educator Dietitian   Select Nutrition   Instruction Review Code 1- Verbalizes Understanding   Class Start Time 1400   Class Stop Time 1446   Class Time Calculation (min) 46 min    Row Name 10/23/22 1500     Education   Cardiac Education  Topics Pritikin   Geographical information systems officer Exercise   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   Instruction Review Code 1- Verbalizes Understanding   Class Start Time 1359   Class Stop Time 1433   Class Time Calculation (min) 34 min            Core Videos: Exercise    Move It!  Clinical staff conducted group or individual video education with verbal and written material and guidebook.  Patient learns the recommended Pritikin exercise program. Exercise with the goal of living a long, healthy life. Some of the health benefits of exercise include controlled diabetes, healthier blood pressure levels, improved cholesterol levels, improved heart and lung capacity, improved sleep, and better body composition. Everyone should speak with their doctor before starting or changing an exercise routine.  Biomechanical Limitations Clinical staff conducted group or individual video education with verbal and written material and guidebook.  Patient learns how biomechanical limitations can impact exercise and how we can mitigate and possibly overcome limitations to have an impactful and balanced exercise routine.  Body Composition Clinical staff conducted group or individual video education with verbal and written material and guidebook.  Patient learns that body composition (ratio of muscle mass to fat mass) is a key component to assessing overall fitness, rather than body weight alone. Increased fat mass, especially visceral belly fat, can put [place] at increased risk for metabolic syndrome, type 2 diabetes, heart disease, and even death. It is recommended to combine diet and exercise (cardiovascular and resistance training) to improve your body composition. Seek guidance from your physician and exercise physiologist before implementing an exercise  routine.  Exercise Action Plan Clinical staff conducted group or individual video education with verbal and written material and guidebook.  Patient learns the recommended strategies to achieve and enjoy long-term exercise adherence, including variety, self-motivation, self-efficacy, and positive decision making. Benefits of exercise include fitness, good health, weight management, more energy, better sleep, less stress, and overall well-being.  Medical   Heart Disease Risk Reduction Clinical staff conducted group or individual video education with verbal and written material and guidebook.  Patient learns our heart is our most vital organ as it circulates oxygen, nutrients, white blood cells, and hormones throughout the entire body, and carries waste away. Data supports a plant-based eating plan like the Pritikin Program for its effectiveness in slowing progression of and reversing heart disease. The video provides a number of recommendations to address heart disease.   Metabolic Syndrome and Belly Fat  Clinical staff conducted group or individual video education with verbal and  written material and guidebook.  Patient learns what metabolic syndrome is, how it leads to heart disease, and how one can reverse it and keep it from coming back. You have metabolic syndrome if you have 3 of the following 5 criteria: abdominal obesity, high blood pressure, high triglycerides, low HDL cholesterol, and high blood sugar.  Hypertension and Heart Disease Clinical staff conducted group or individual video education with verbal and written material and guidebook.  Patient learns that high blood pressure, or hypertension, is very common in the [place]. Hypertension is largely due to excessive salt intake, but other important risk factors include being overweight, physical inactivity, drinking too much alcohol, smoking, and not eating enough potassium from fruits and vegetables. High blood pressure is a  leading risk factor for heart attack, stroke, congestive heart failure, dementia, kidney failure, and premature death. Long-term effects of excessive salt intake include stiffening of the arteries and thickening of heart muscle and organ damage. Recommendations include ways to reduce hypertension and the risk of heart disease.  Diseases of Our Time - Focusing on Diabetes Clinical staff conducted group or individual video education with verbal and written material and guidebook.  Patient learns why the best way to stop diseases of our time is prevention, through food and other lifestyle changes. Medicine (such as prescription pills and surgeries) is often only a Band-Aid on the problem, not a long-term solution. Most common diseases of our time include obesity, type 2 diabetes, hypertension, heart disease, and cancer. The Pritikin Program is recommended and has been proven to help reduce, reverse, and/or prevent the damaging effects of metabolic syndrome.  Nutrition   Overview of the Pritikin Eating Plan  Clinical staff conducted group or individual video education with verbal and written material and guidebook.  Patient learns about the Pritikin Eating Plan for disease risk reduction. The Pritikin Eating Plan emphasizes a wide variety of unrefined, minimally-processed carbohydrates, like fruits, vegetables, whole grains, and legumes. Go, Caution, and Stop food choices are explained. Plant-based and lean animal proteins are emphasized. Rationale provided for low sodium intake for blood pressure control, low added sugars for blood sugar stabilization, and low added fats and oils for coronary artery disease risk reduction and weight management.  Calorie Density  Clinical staff conducted group or individual video education with verbal and written material and guidebook.  Patient learns about calorie density and how it impacts the Pritikin Eating Plan. Knowing the characteristics of the food you choose will  help you decide whether those foods will lead to weight gain or weight loss, and whether you want to consume more or less of them. Weight loss is usually a side effect of the Pritikin Eating Plan because of its focus on low calorie-dense foods.  Label Reading  Clinical staff conducted group or individual video education with verbal and written material and guidebook.  Patient learns about the Pritikin recommended label reading guidelines and corresponding recommendations regarding calorie density, added sugars, sodium content, and whole grains.  Dining Out - Part 1  Clinical staff conducted group or individual video education with verbal and written material and guidebook.  Patient learns that restaurant meals can be sabotaging because they can be so high in calories, fat, sodium, and/or sugar. Patient learns recommended strategies on how to positively address this and avoid unhealthy pitfalls.  Facts on Fats  Clinical staff conducted group or individual video education with verbal and written material and guidebook.  Patient learns that lifestyle modifications can be just as effective,  if not more so, as many medications for lowering your risk of heart disease. A Pritikin lifestyle can help to reduce your risk of inflammation and atherosclerosis (cholesterol build-up, or plaque, in the artery walls). Lifestyle interventions such as dietary choices and physical activity address the cause of atherosclerosis. A review of the types of fats and their impact on blood cholesterol levels, along with dietary recommendations to reduce fat intake is also included.  Nutrition Action Plan  Clinical staff conducted group or individual video education with verbal and written material and guidebook.  Patient learns how to incorporate Pritikin recommendations into their lifestyle. Recommendations include planning and keeping personal health goals in mind as an important part of their success.  Healthy Mind-Set     Healthy Minds, Bodies, Hearts  Clinical staff conducted group or individual video education with verbal and written material and guidebook.  Patient learns how to identify when they are stressed. Video will discuss the impact of that stress, as well as the many benefits of stress management. Patient will also be introduced to stress management techniques. The way we think, act, and feel has an impact on our hearts.  How Our Thoughts Can Heal Our Hearts  Clinical staff conducted group or individual video education with verbal and written material and guidebook.  Patient learns that negative thoughts can cause depression and anxiety. This can result in negative lifestyle behavior and serious health problems. Cognitive behavioral therapy is an effective method to help control our thoughts in order to change and improve our emotional outlook.  Additional Videos:  Exercise    Improving Performance  Clinical staff conducted group or individual video education with verbal and written material and guidebook.  Patient learns to use a non-linear approach by alternating intensity levels and lengths of time spent exercising to help burn more calories and lose more body fat. Cardiovascular exercise helps improve heart health, metabolism, hormonal balance, blood sugar control, and recovery from fatigue. Resistance training improves strength, endurance, balance, coordination, reaction time, metabolism, and muscle mass. Flexibility exercise improves circulation, posture, and balance. Seek guidance from your physician and exercise physiologist before implementing an exercise routine and learn your capabilities and proper form for all exercise.  Introduction to Yoga  Clinical staff conducted group or individual video education with verbal and written material and guidebook.  Patient learns about yoga, a discipline of the coming together of mind, breath, and body. The benefits of yoga include improved flexibility,  improved range of motion, better posture and core strength, increased lung function, weight loss, and positive self-image. Yoga's heart health benefits include lowered blood pressure, healthier heart rate, decreased cholesterol and triglyceride levels, improved immune function, and reduced stress. Seek guidance from your physician and exercise physiologist before implementing an exercise routine and learn your capabilities and proper form for all exercise.  Medical   Aging: Enhancing Your Quality of Life  Clinical staff conducted group or individual video education with verbal and written material and guidebook.  Patient learns key strategies and recommendations to stay in good physical health and enhance quality of life, such as prevention strategies, having an advocate, securing a Health Care Proxy and Power of Attorney, and keeping a list of medications and system for tracking them. It also discusses how to avoid risk for bone loss.  Biology of Weight Control  Clinical staff conducted group or individual video education with verbal and written material and guidebook.  Patient learns that weight gain occurs because we consume more calories than we  burn (eating more, moving less). Even if your body weight is normal, you may have higher ratios of fat compared to muscle mass. Too much body fat puts you at increased risk for cardiovascular disease, heart attack, stroke, type 2 diabetes, and obesity-related cancers. In addition to exercise, following the Pritikin Eating Plan can help reduce your risk.  Decoding Lab Results  Clinical staff conducted group or individual video education with verbal and written material and guidebook.  Patient learns that lab test reflects one measurement whose values change over time and are influenced by many factors, including medication, stress, sleep, exercise, food, hydration, pre-existing medical conditions, and more. It is recommended to use the knowledge from this  video to become more involved with your lab results and evaluate your numbers to speak with your doctor.   Diseases of Our Time - Overview  Clinical staff conducted group or individual video education with verbal and written material and guidebook.  Patient learns that according to the CDC, 50% to 70% of chronic diseases (such as obesity, type 2 diabetes, elevated lipids, hypertension, and heart disease) are avoidable through lifestyle improvements including healthier food choices, listening to satiety cues, and increased physical activity.  Sleep Disorders Clinical staff conducted group or individual video education with verbal and written material and guidebook.  Patient learns how good quality and duration of sleep are important to overall health and well-being. Patient also learns about sleep disorders and how they impact health along with recommendations to address them, including discussing with a physician.  Nutrition  Dining Out - Part 2 Clinical staff conducted group or individual video education with verbal and written material and guidebook.  Patient learns how to plan ahead and communicate in order to maximize their dining experience in a healthy and nutritious manner. Included are recommended food choices based on the type of restaurant the patient is visiting.   Fueling a Banker conducted group or individual video education with verbal and written material and guidebook.  There is a strong connection between our food choices and our health. Diseases like obesity and type 2 diabetes are very prevalent and are in large-part due to lifestyle choices. The Pritikin Eating Plan provides plenty of food and hunger-curbing satisfaction. It is easy to follow, affordable, and helps reduce health risks.  Menu Workshop  Clinical staff conducted group or individual video education with verbal and written material and guidebook.  Patient learns that restaurant meals can  sabotage health goals because they are often packed with calories, fat, sodium, and sugar. Recommendations include strategies to plan ahead and to communicate with the manager, chef, or server to help order a healthier meal.  Planning Your Eating Strategy  Clinical staff conducted group or individual video education with verbal and written material and guidebook.  Patient learns about the Pritikin Eating Plan and its benefit of reducing the risk of disease. The Pritikin Eating Plan does not focus on calories. Instead, it emphasizes high-quality, nutrient-rich foods. By knowing the characteristics of the foods, we choose, we can determine their calorie density and make informed decisions.  Targeting Your Nutrition Priorities  Clinical staff conducted group or individual video education with verbal and written material and guidebook.  Patient learns that lifestyle habits have a tremendous impact on disease risk and progression. This video provides eating and physical activity recommendations based on your personal health goals, such as reducing LDL cholesterol, losing weight, preventing or controlling type 2 diabetes, and reducing high blood pressure.  Vitamins and Minerals  Clinical staff conducted group or individual video education with verbal and written material and guidebook.  Patient learns different ways to obtain key vitamins and minerals, including through a recommended healthy diet. It is important to discuss all supplements you take with your doctor.   Healthy Mind-Set    Smoking Cessation  Clinical staff conducted group or individual video education with verbal and written material and guidebook.  Patient learns that cigarette smoking and tobacco addiction pose a serious health risk which affects millions of people. Stopping smoking will significantly reduce the risk of heart disease, lung disease, and many forms of cancer. Recommended strategies for quitting are covered, including  working with your doctor to develop a successful plan.  Culinary   Becoming a Set designer conducted group or individual video education with verbal and written material and guidebook.  Patient learns that cooking at home can be healthy, cost-effective, quick, and puts them in control. Keys to cooking healthy recipes will include looking at your recipe, assessing your equipment needs, planning ahead, making it simple, choosing cost-effective seasonal ingredients, and limiting the use of added fats, salts, and sugars.  Cooking - Breakfast and Snacks  Clinical staff conducted group or individual video education with verbal and written material and guidebook.  Patient learns how important breakfast is to satiety and nutrition through the entire day. Recommendations include key foods to eat during breakfast to help stabilize blood sugar levels and to prevent overeating at meals later in the day. Planning ahead is also a key component.  Cooking - Educational psychologist conducted group or individual video education with verbal and written material and guidebook.  Patient learns eating strategies to improve overall health, including an approach to cook more at home. Recommendations include thinking of animal protein as a side on your plate rather than center stage and focusing instead on lower calorie dense options like vegetables, fruits, whole grains, and plant-based proteins, such as beans. Making sauces in large quantities to freeze for later and leaving the skin on your vegetables are also recommended to maximize your experience.  Cooking - Healthy Salads and Dressing Clinical staff conducted group or individual video education with verbal and written material and guidebook.  Patient learns that vegetables, fruits, whole grains, and legumes are the foundations of the Pritikin Eating Plan. Recommendations include how to incorporate each of these in flavorful and healthy  salads, and how to create homemade salad dressings. Proper handling of ingredients is also covered. Cooking - Soups and [employer] - Soups and Desserts Clinical staff conducted group or individual video education with verbal and written material and guidebook.  Patient learns that Pritikin soups and desserts make for easy, nutritious, and delicious snacks and meal components that are low in sodium, fat, sugar, and calorie density, while high in vitamins, minerals, and filling fiber. Recommendations include simple and healthy ideas for soups and desserts.   Overview     The Pritikin Solution Program Overview Clinical staff conducted group or individual video education with verbal and written material and guidebook.  Patient learns that the results of the Pritikin Program have been documented in more than 100 articles published in peer-reviewed journals, and the benefits include reducing risk factors for (and, in some cases, even reversing) high cholesterol, high blood pressure, type 2 diabetes, obesity, and more! An overview of the three key pillars of the Pritikin Program will be covered: eating well, doing regular exercise, and  having a healthy mind-set.  WORKSHOPS  Exercise: Exercise Basics: Building Your Action Plan Clinical staff led group instruction and group discussion with PowerPoint presentation and patient guidebook. To enhance the learning environment the use of posters, models and videos may be added. At the conclusion of this workshop, patients will comprehend the difference between physical activity and exercise, as well as the benefits of incorporating both, into their routine. Patients will understand the FITT (Frequency, Intensity, Time, and Type) principle and how to use it to build an exercise action plan. In addition, safety concerns and other considerations for exercise and cardiac rehab will be addressed by the presenter. The purpose of this lesson is to promote a  comprehensive and effective weekly exercise routine in order to improve patients' overall level of fitness.   Managing Heart Disease: Your Path to a Healthier Heart Clinical staff led group instruction and group discussion with PowerPoint presentation and patient guidebook. To enhance the learning environment the use of posters, models and videos may be added.At the conclusion of this workshop, patients will understand the anatomy and physiology of the heart. Additionally, they will understand how Pritikin's three pillars impact the risk factors, the progression, and the management of heart disease.  The purpose of this lesson is to provide a high-level overview of the heart, heart disease, and how the Pritikin lifestyle positively impacts risk factors.  Exercise Biomechanics Clinical staff led group instruction and group discussion with PowerPoint presentation and patient guidebook. To enhance the learning environment the use of posters, models and videos may be added. Patients will learn how the structural parts of their bodies function and how these functions impact their daily activities, movement, and exercise. Patients will learn how to promote a neutral spine, learn how to manage pain, and identify ways to improve their physical movement in order to promote healthy living. The purpose of this lesson is to expose patients to common physical limitations that impact physical activity. Participants will learn practical ways to adapt and manage aches and pains, and to minimize their effect on regular exercise. Patients will learn how to maintain good posture while sitting, walking, and lifting.  Balance Training and Fall Prevention  Clinical staff led group instruction and group discussion with PowerPoint presentation and patient guidebook. To enhance the learning environment the use of posters, models and videos may be added. At the conclusion of this workshop, patients will understand the  importance of their sensorimotor skills (vision, proprioception, and the vestibular system) in maintaining their ability to balance as they age. Patients will apply a variety of balancing exercises that are appropriate for their current level of function. Patients will understand the common causes for poor balance, possible solutions to these problems, and ways to modify their physical environment in order to minimize their fall risk. The purpose of this lesson is to teach patients about the importance of maintaining balance as they age and ways to minimize their risk of falling.  WORKSHOPS   Nutrition:  Fueling a Ship broker led group instruction and group discussion with PowerPoint presentation and patient guidebook. To enhance the learning environment the use of posters, models and videos may be added. Patients will review the foundational principles of the Pritikin Eating Plan and understand what constitutes a serving size in each of the food groups. Patients will also learn Pritikin-friendly foods that are better choices when away from home and review make-ahead meal and snack options. Calorie density will be reviewed and applied to three nutrition  priorities: weight maintenance, weight loss, and weight gain. The purpose of this lesson is to reinforce (in a group setting) the key concepts around what patients are recommended to eat and how to apply these guidelines when away from home by planning and selecting Pritikin-friendly options. Patients will understand how calorie density may be adjusted for different weight management goals.  Mindful Eating  Clinical staff led group instruction and group discussion with PowerPoint presentation and patient guidebook. To enhance the learning environment the use of posters, models and videos may be added. Patients will briefly review the concepts of the Pritikin Eating Plan and the importance of low-calorie dense foods. The concept of mindful  eating will be introduced as well as the importance of paying attention to internal hunger signals. Triggers for non-hunger eating and techniques for dealing with triggers will be explored. The purpose of this lesson is to provide patients with the opportunity to review the basic principles of the Pritikin Eating Plan, discuss the value of eating mindfully and how to measure internal cues of hunger and fullness using the Hunger Scale. Patients will also discuss reasons for non-hunger eating and learn strategies to use for controlling emotional eating.  Targeting Your Nutrition Priorities Clinical staff led group instruction and group discussion with PowerPoint presentation and patient guidebook. To enhance the learning environment the use of posters, models and videos may be added. Patients will learn how to determine their genetic susceptibility to disease by reviewing their family history. Patients will gain insight into the importance of diet as part of an overall healthy lifestyle in mitigating the impact of genetics and other environmental insults. The purpose of this lesson is to provide patients with the opportunity to assess their personal nutrition priorities by looking at their family history, their own health history and current risk factors. Patients will also be able to discuss ways of prioritizing and modifying the Pritikin Eating Plan for their highest risk areas  Menu  Clinical staff led group instruction and group discussion with PowerPoint presentation and patient guidebook. To enhance the learning environment the use of posters, models and videos may be added. Using menus brought in from [employer], or printed from [employer], patients will apply the Pritikin dining out guidelines that were presented in the [employer] video. Patients will also be able to practice these guidelines in a variety of provided scenarios. The purpose of this lesson is to provide  patients with the opportunity to practice hands-on learning of the Pritikin Dining Out guidelines with actual menus and practice scenarios.  Label Reading Clinical staff led group instruction and group discussion with PowerPoint presentation and patient guidebook. To enhance the learning environment the use of posters, models and videos may be added. Patients will review and discuss the Pritikin label reading guidelines presented in [employer]ritikin's Label Reading Educational series video. Using fool labels brought in from local grocery stores and markets, patients will apply the label reading guidelines and determine if the packaged food meet the Pritikin guidelines. The purpose of this lesson is to provide patients with the opportunity to review, discuss, and practice hands-on learning of the Pritikin Label Reading guidelines with actual packaged food labels. Cooking School  Pritikin's [employer] are designed to teach patients ways to prepare quick, simple, and affordable recipes at home. The importance of nutrition's role in chronic disease risk reduction is reflected in its emphasis in the overall Pritikin program. By learning how to prepare essential core Pritikin Eating Plan recipes,  patients will increase control over what they eat; be able to customize the flavor of foods without the use of added salt, sugar, or fat; and improve the quality of the food they consume. By learning a set of core recipes which are easily assembled, quickly prepared, and affordable, patients are more likely to prepare more healthy foods at home. These workshops focus on convenient breakfasts, simple entres, side dishes, and desserts which can be prepared with minimal effort and are consistent with nutrition recommendations for cardiovascular risk reduction. Cooking [employer] are taught by a Armed forces logistics/support/administrative officer (RD) who has been trained by the [employer]. The chef or RD has a clear  understanding of the importance of minimizing - if not completely eliminating - added fat, sugar, and sodium in recipes. Throughout the series of Cooking School Workshop sessions, patients will learn about healthy ingredients and efficient methods of cooking to build confidence in their capability to prepare    Cooking School weekly topics:  Adding Flavor- Sodium-Free  Fast and Healthy Breakfasts  Powerhouse Plant-Based Proteins  Satisfying Salads and Dressings  Simple Sides and Sauces  International Cuisine-Spotlight on the [employer] Zones  Delicious Desserts  Savory Soups  [employer] - Meals in a Astronomer Appetizers and Snacks  Comforting Weekend Breakfasts  One-Pot Wonders   Fast Evening Meals  Landscape architect Your Pritikin Plate  WORKSHOPS   Healthy Mindset (Psychosocial):  Focused Goals, Sustainable Changes Clinical staff led group instruction and group discussion with PowerPoint presentation and patient guidebook. To enhance the learning environment the use of posters, models and videos may be added. Patients will be able to apply effective goal setting strategies to establish at least one personal goal, and then take consistent, meaningful action toward that goal. They will learn to identify common barriers to achieving personal goals and develop strategies to overcome them. Patients will also gain an understanding of how our mind-set can impact our ability to achieve goals and the importance of cultivating a positive and growth-oriented mind-set. The purpose of this lesson is to provide patients with a deeper understanding of how to set and achieve personal goals, as well as the tools and strategies needed to overcome common obstacles which may arise along the way.  From Head to Heart: The Power of a Healthy Outlook  Clinical staff led group instruction and group discussion with PowerPoint presentation and patient guidebook. To enhance the learning  environment the use of posters, models and videos may be added. Patients will be able to recognize and describe the impact of emotions and mood on physical health. They will discover the importance of self-care and explore self-care practices which may work for them. Patients will also learn how to utilize the 4 C's to cultivate a healthier outlook and better manage stress and challenges. The purpose of this lesson is to demonstrate to patients how a healthy outlook is an essential part of maintaining good health, especially as they continue their cardiac rehab journey.  Healthy Sleep for a Healthy Heart Clinical staff led group instruction and group discussion with PowerPoint presentation and patient guidebook. To enhance the learning environment the use of posters, models and videos may be added. At the conclusion of this workshop, patients will be able to demonstrate knowledge of the importance of sleep to overall health, well-being, and quality of life. They will understand the symptoms of, and treatments for, common sleep disorders. Patients will also be able to identify daytime and  nighttime behaviors which impact sleep, and they will be able to apply these tools to help manage sleep-related challenges. The purpose of this lesson is to provide patients with a general overview of sleep and outline the importance of quality sleep. Patients will learn about a few of the most common sleep disorders. Patients will also be introduced to the concept of "sleep hygiene," and discover ways to self-manage certain sleeping problems through simple daily behavior changes. Finally, the workshop will motivate patients by clarifying the links between quality sleep and their goals of heart-healthy living.   Recognizing and Reducing Stress Clinical staff led group instruction and group discussion with PowerPoint presentation and patient guidebook. To enhance the learning environment the use of posters, models and videos  may be added. At the conclusion of this workshop, patients will be able to understand the types of stress reactions, differentiate between acute and chronic stress, and recognize the impact that chronic stress has on their health. They will also be able to apply different coping mechanisms, such as reframing negative self-talk. Patients will have the opportunity to practice a variety of stress management techniques, such as deep abdominal breathing, progressive muscle relaxation, and/or guided imagery.  The purpose of this lesson is to educate patients on the role of stress in their lives and to provide healthy techniques for coping with it.  Learning Barriers/Preferences:  Learning Barriers/Preferences - 10/10/22 1105       Learning Barriers/Preferences   Learning Barriers None    Learning Preferences Written Material;Video;Audio;Group Instruction;Individual Instruction;Computer/Internet;Verbal Instruction             Education Topics:  Knowledge Questionnaire Score:  Knowledge Questionnaire Score - 10/10/22 1222       Knowledge Questionnaire Score   Pre Score 21/24             Core Components/Risk Factors/Patient Goals at Admission:  Personal Goals and Risk Factors at Admission - 10/10/22 1105       Core Components/Risk Factors/Patient Goals on Admission    Weight Management Yes;Obesity;Weight Loss    Intervention Weight Management/Obesity: Establish reasonable short term and long term weight goals.;Obesity: Provide education and appropriate resources to help participant work on and attain dietary goals.    Admit Weight 248 lb 10.9 oz (112.8 kg)    Expected Outcomes Short Term: Continue to assess and modify interventions until short term weight is achieved;Weight Loss: Understanding of general recommendations for a balanced deficit meal plan, which promotes 1-2 lb weight loss per week and includes a negative energy balance of [id number] kcal/d    Hypertension Yes     Intervention Provide education on lifestyle modifcations including regular physical activity/exercise, weight management, moderate sodium restriction and increased consumption of fresh fruit, vegetables, and low fat dairy, alcohol moderation, and smoking cessation.;Monitor prescription use compliance.    Expected Outcomes Short Term: Continued assessment and intervention until BP is < 140/31mm HG in hypertensive participants. < 130/73mm HG in hypertensive participants with diabetes, heart failure or chronic kidney disease.;Long Term: Maintenance of blood pressure at goal levels.    Lipids Yes    Intervention Provide education and support for participant on nutrition & aerobic/resistive exercise along with prescribed medications to achieve LDL 70mg , HDL >40mg .    Expected Outcomes Short Term: Participant states understanding of desired cholesterol values and is compliant with medications prescribed. Participant is following exercise prescription and nutrition guidelines.;Long Term: Cholesterol controlled with medications as prescribed, with individualized exercise RX and with personalized nutrition plan. Value goals: LDL <  70mg , HDL > 40 mg.    Stress Yes    Intervention Offer individual and/or small group education and counseling on adjustment to heart disease, stress management and health-related lifestyle change. Teach and support self-help strategies.;Refer participants experiencing significant psychosocial distress to appropriate mental health specialists for further evaluation and treatment. When possible, include family members and significant others in education/counseling sessions.    Expected Outcomes Short Term: Participant demonstrates changes in health-related behavior, relaxation and other stress management skills, ability to obtain effective social support, and compliance with psychotropic medications if prescribed.;Long Term: Emotional wellbeing is indicated by absence of clinically  significant psychosocial distress or social isolation.    Personal Goal Other Yes    Personal Goal Learn to eat better for better health. Be more active.    Intervention Patient will attend nutrition and exercise workshops/ video education. Provide individualized nutrition and exercise plan that patient can follow.    Expected Outcomes Patient will be compliant with nutrtion and exercise plan.             Core Components/Risk Factors/Patient Goals Review:   Goals and Risk Factor Review     Row Name 10/17/22 0848 11/03/22 1721           Core Components/Risk Factors/Patient Goals Review   Personal Goals Review Weight Management/Obesity;Hypertension;Stress Weight Management/Obesity;Hypertension;Stress      Review [name] started intensive cardiac rehab on 10/16/22 and did well with exercise. Vital signs were stable [name] is doing  well with exercise at intensive cardiac rehab.. Vital signs have been stable. [name] is enjoying participating in the program      Expected Outcomes [name] will continue to participate in intensive cardiac rehab for exercise, nutrition and lifestyle modifications [name] will continue to participate in intensive cardiac rehab for exercise, nutrition and lifestyle modifications               Core Components/Risk Factors/Patient Goals at Discharge (Final Review):   Goals and Risk Factor Review - 11/03/22 1721       Core Components/Risk Factors/Patient Goals Review   Personal Goals Review Weight Management/Obesity;Hypertension;Stress    Review [name] is doing  well with exercise at intensive cardiac rehab.. Vital signs have been stable. [name] is enjoying participating in the program    Expected Outcomes [name] will continue to participate in intensive cardiac rehab for exercise, nutrition and lifestyle modifications             ITP Comments:  ITP Comments     Row Name 10/10/22 0931 10/17/22 0841 11/03/22 1717       ITP Comments Medical Director- Dr.  Armanda Magic, MD. Reviewd orientation folder with patient. 30 Day ITP Review. Hafsah started intensive cardiac rehab on 10/16/22 and did well with exercise 30 Day ITP Review. Katelynne has good participation in  intensive cardiac when in attendance              Comments: See ITP comments.Thayer Headings RN BSN

## 2022-11-06 ENCOUNTER — Encounter (HOSPITAL_COMMUNITY)
Admission: RE | Admit: 2022-11-06 | Discharge: 2022-11-06 | Disposition: A | Discharge status: Home / Self Care | Payer: Medicaid Other | Source: Ambulatory Visit | Attending: Cardiology | Admitting: Cardiology

## 2022-11-06 DIAGNOSIS — Z955 Presence of coronary angioplasty implant and graft: Secondary | ICD-10-CM

## 2022-11-08 ENCOUNTER — Encounter (HOSPITAL_COMMUNITY): Payer: Medicaid Other

## 2022-11-09 ENCOUNTER — Ambulatory Visit (INDEPENDENT_AMBULATORY_CARE_PROVIDER_SITE_OTHER): Payer: Medicaid Other | Admitting: Nurse Practitioner

## 2022-11-09 VITALS — BP 130/78 | HR 70 | Temp 97.5°F | Ht 61.0 in | Wt 248.2 lb

## 2022-11-09 DIAGNOSIS — M25511 Pain in right shoulder: Secondary | ICD-10-CM

## 2022-11-09 DIAGNOSIS — M546 Pain in thoracic spine: Secondary | ICD-10-CM | POA: Diagnosis not present

## 2022-11-09 DIAGNOSIS — F330 Major depressive disorder, recurrent, mild: Secondary | ICD-10-CM

## 2022-11-09 DIAGNOSIS — M62830 Muscle spasm of back: Secondary | ICD-10-CM

## 2022-11-09 MED ORDER — PANTOPRAZOLE SODIUM 40 MG PO TBEC: 40.0000 mg | DELAYED_RELEASE_TABLET | Freq: Every day | ORAL | 1 refills | Status: DC

## 2022-11-09 MED ORDER — BUPROPION HCL ER (XL) 150 MG PO TB24: 150.0000 mg | ORAL_TABLET | Freq: Every day | ORAL | 1 refills | Status: DC

## 2022-11-09 MED ORDER — PREDNISONE 10 MG (21) PO TBPK: ORAL_TABLET | ORAL | 0 refills | Status: DC

## 2022-11-09 MED ORDER — METHOCARBAMOL 750 MG PO TABS: 750.0000 mg | ORAL_TABLET | Freq: Two times a day (BID) | ORAL | 2 refills | Status: DC | PRN

## 2022-11-09 MED ORDER — DICLOFENAC SODIUM 1 % EX GEL: 2.0000 g | Freq: Four times a day (QID) | CUTANEOUS | 2 refills | Status: DC

## 2022-11-09 NOTE — Progress Notes (Signed)
I,Sheena H Holbrook,acting as a Neurosurgeon for Arnette Felts, FNP.,have documented all relevant documentation on the behalf of Arnette Felts, FNP,as directed by  Arnette Felts, FNP while in the presence of Arnette Felts, FNP.    Subjective:     Patient ID: [name] , female    DOB: 1976/01/03 , 47 y.o.   MRN: [id number]   Chief Complaint  Patient presents with   Medical Management of Chronic Issues    HPI  Patient presents today for follow up on depression/anxiety. Patient is not taking Trintellix but has switch to Wellbutrin due to her insurance. Also patient has pain in the middle of her back. The pain has been present about 2 weeks. When she tries to lay down she has pain and she lays on her right side she has pain. She has taken diclofenac and ibuprofen without relief. Pain rates 8/10.   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 - states BP is under control; has been on med. x 12 yr.   Migraines    Right tennis elbow 11/21/2013     Family History  Problem Relation Age of Onset   Hypertension Mother    Diabetes Mother    Cancer Mother    Hypertension Maternal Grandmother    Heart attack Paternal Grandmother    Diabetes Brother    Hypertension Brother    Diabetes Brother    Cancer Other    Breast cancer Neg Hx      Current Outpatient Medications:    amLODipine (NORVASC) 10 MG tablet, Take 1 tablet (10 mg total) by mouth daily., Disp: 90 tablet, Rfl: 0   atorvastatin (LIPITOR) 40 MG tablet, Take 40 mg by mouth daily., Disp: , Rfl:    clopidogrel (PLAVIX) 75 MG tablet, Take 1 tablet by mouth daily.,  Disp: , Rfl:    diclofenac Sodium (VOLTAREN) 1 % GEL, Apply 2 g topically 4 (four) times daily., Disp: 100 g, Rfl: 2   estradiol (ESTRACE) 2 MG tablet, Take 1 tablet (2 mg total) by mouth daily., Disp: 30 tablet, Rfl: 1   hydrochlorothiazide (HYDRODIURIL) 25 MG tablet, Take 1 tablet (25 mg total) by mouth every morning., Disp: 90 tablet, Rfl: 1   losartan (COZAAR) 100 MG tablet, Take 1 tablet (100 mg total) by mouth daily., Disp: 90 tablet, Rfl: 1   Magnesium Gluconate 250 MG TABS, Take 1 tablet (250 mg total) by mouth every evening. Take with evening meal, Disp: 90 tablet, Rfl: 1   predniSONE (STERAPRED UNI-PAK 21 TAB) 10 MG (21) TBPK tablet, Take as directed, Disp: 21 tablet, Rfl: 0   Vitamin D, Ergocalciferol, (DRISDOL) 1.25 MG (50000 UNIT) CAPS capsule, Take 1 capsule (50,000 Units total) by mouth every 7 (seven) days., Disp: 12 capsule, Rfl: 1   buPROPion (WELLBUTRIN XL) 150 MG 24 hr tablet, Take 1 tablet (150 mg total) by mouth daily., Disp: 90 tablet, Rfl: 1   methocarbamol (ROBAXIN-750) 750 MG tablet, Take 1 tablet (750 mg total) by  mouth 2 (two) times daily as needed for muscle spasms., Disp: 20 tablet, Rfl: 2   pantoprazole (PROTONIX) 40 MG tablet, Take 1 tablet (40 mg total) by mouth daily., Disp: 90 tablet, Rfl: 1   potassium chloride SA (KLOR-CON M) 20 MEQ tablet, Take 20 mEq by mouth 2 (two) times daily. (Patient not taking: Reported on 10/12/2022), Disp: , Rfl:    Allergies  Allergen Reactions   Venlafaxine Anaphylaxis and Nausea Only    Insomnia; Depression Insomnia; Depression      Review of Systems  Constitutional: Negative.   Respiratory: Negative.    Cardiovascular: Negative.   Musculoskeletal:  Positive for back pain.  Neurological: Negative.   Psychiatric/Behavioral: Negative.       Today's Vitals   11/09/22 1622  BP: 130/78  Pulse: 70  Temp: (!) 97.5 F (36.4 C)  TempSrc: Oral  SpO2: 97%  Weight: 248 lb 3.2 oz (112.6 kg)  Height: 5\' 1"  (1.549 m)  PainSc:  8   PainLoc: Back   Body mass index is 46.9 kg/m.   Objective:  Physical Exam Vitals reviewed.  Constitutional:      General: She is not in acute distress.    Appearance: Normal appearance. She is obese.  Cardiovascular:     Rate and Rhythm: Normal rate and regular rhythm.     Pulses: Normal pulses.     Heart sounds: Normal heart sounds. No murmur heard. Pulmonary:     Effort: Pulmonary effort is normal. No respiratory distress.     Breath sounds: Normal breath sounds. No wheezing.  Musculoskeletal:        General: Tenderness (right shoulder) present.  Skin:    General: Skin is warm and dry.     Capillary Refill: Capillary refill takes less than 2 seconds.  Neurological:     General: No focal deficit present.     Mental Status: She is alert and oriented to person, place, and time.     Cranial Nerves: No cranial nerve deficit.     Motor: No weakness.  Psychiatric:        Mood and Affect: Mood normal.        Behavior: Behavior normal.        Thought Content: Thought content normal.        Judgment: Judgment normal.         Assessment And Plan:     1. Mild recurrent major depression (HCC) Comments: She is doing well with her current medications, she was unable to get the trintellix due to needing to try other medications. - buPROPion (WELLBUTRIN XL) 150 MG 24 hr tablet; Take 1 tablet (150 mg total) by mouth daily.  Dispense: 90 tablet; Refill: 1  2. Acute right-sided thoracic back pain Comments: Muscle relaxer given, if she has increased inflammation to back the prednisone will also be effective. - methocarbamol (ROBAXIN-750) 750 MG tablet; Take 1 tablet (750 mg total) by mouth 2 (two) times daily as needed for muscle spasms.  Dispense: 20 tablet; Refill: 2 - predniSONE (STERAPRED UNI-PAK 21 TAB) 10 MG (21) TBPK tablet; Take as directed  Dispense: 21 tablet; Refill: 0  3. Acute pain of right shoulder Comments: Tenderness with range of motion and tenderness to bursa  space, will treat with prednisone. - methocarbamol (ROBAXIN-750) 750 MG tablet; Take 1 tablet (750 mg total) by mouth 2 (two) times daily as needed for muscle spasms.  Dispense: 20 tablet; Refill: 2 - diclofenac Sodium (VOLTAREN) 1 % GEL; Apply 2 g  topically 4 (four) times daily.  Dispense: 100 g; Refill: 2  4. Muscle spasm of back - predniSONE (STERAPRED UNI-PAK 21 TAB) 10 MG (21) TBPK tablet; Take as directed  Dispense: 21 tablet; Refill: 0     Patient was given opportunity to ask questions. Patient verbalized understanding of the plan and was able to repeat key elements of the plan. All questions were answered to their satisfaction.  Arnette Felts, FNP   I, Arnette Felts, FNP, have reviewed all documentation for this visit. The documentation on 11/09/22 for the exam, diagnosis, procedures, and orders are all accurate and complete.   IF YOU HAVE BEEN REFERRED TO A SPECIALIST, IT MAY TAKE 1-2 WEEKS TO SCHEDULE/PROCESS THE REFERRAL. IF YOU HAVE NOT HEARD FROM US/SPECIALIST IN TWO WEEKS, PLEASE GIVE [place] A CALL AT [phone] X [phone].   THE PATIENT IS ENCOURAGED TO PRACTICE SOCIAL DISTANCING DUE TO THE COVID-19 PANDEMIC.

## 2022-11-10 ENCOUNTER — Encounter (HOSPITAL_COMMUNITY): Payer: Medicaid Other

## 2022-11-13 ENCOUNTER — Encounter (HOSPITAL_COMMUNITY)
Admission: RE | Admit: 2022-11-13 | Discharge: 2022-11-13 | Disposition: A | Discharge status: Home / Self Care | Payer: Medicaid Other | Source: Ambulatory Visit | Attending: Cardiology | Admitting: Cardiology

## 2022-11-13 DIAGNOSIS — Z955 Presence of coronary angioplasty implant and graft: Secondary | ICD-10-CM | POA: Diagnosis not present

## 2022-11-15 ENCOUNTER — Encounter (HOSPITAL_COMMUNITY): Payer: Medicaid Other

## 2022-11-17 ENCOUNTER — Encounter (HOSPITAL_COMMUNITY)
Admission: RE | Admit: 2022-11-17 | Discharge: 2022-11-17 | Disposition: A | Discharge status: Home / Self Care | Payer: Medicaid Other | Source: Ambulatory Visit | Attending: Cardiology | Admitting: Cardiology

## 2022-11-17 DIAGNOSIS — Z955 Presence of coronary angioplasty implant and graft: Secondary | ICD-10-CM | POA: Diagnosis not present

## 2022-11-19 ENCOUNTER — Encounter: Payer: Self-pay | Admitting: Nurse Practitioner

## 2022-11-20 ENCOUNTER — Encounter (HOSPITAL_COMMUNITY): Payer: Medicaid Other

## 2022-11-22 ENCOUNTER — Encounter (HOSPITAL_COMMUNITY): Payer: Medicaid Other

## 2022-11-22 ENCOUNTER — Ambulatory Visit (HOSPITAL_COMMUNITY): Payer: Medicaid Other

## 2022-11-24 ENCOUNTER — Encounter (HOSPITAL_COMMUNITY): Payer: Medicaid Other

## 2022-11-24 ENCOUNTER — Encounter (HOSPITAL_COMMUNITY)
Admission: RE | Admit: 2022-11-24 | Discharge: 2022-11-24 | Disposition: A | Discharge status: Home / Self Care | Payer: Medicaid Other | Source: Ambulatory Visit | Attending: Cardiology | Admitting: Cardiology

## 2022-11-24 DIAGNOSIS — Z955 Presence of coronary angioplasty implant and graft: Secondary | ICD-10-CM | POA: Insufficient documentation

## 2022-11-27 ENCOUNTER — Encounter (HOSPITAL_COMMUNITY): Payer: Medicaid Other

## 2022-11-27 ENCOUNTER — Encounter (HOSPITAL_COMMUNITY)
Admission: RE | Admit: 2022-11-27 | Discharge: 2022-11-27 | Disposition: A | Discharge status: Home / Self Care | Payer: Medicaid Other | Source: Ambulatory Visit | Attending: Cardiology | Admitting: Cardiology

## 2022-11-27 DIAGNOSIS — Z955 Presence of coronary angioplasty implant and graft: Secondary | ICD-10-CM | POA: Diagnosis not present

## 2022-11-27 IMAGING — US US ABDOMEN LIMITED
1 series · 14 of 25 positions shown · non-contrast
Comparison: None.

CLINICAL DATA: Elevated LFTs.

EXAM:
ULTRASOUND ABDOMEN LIMITED RIGHT UPPER QUADRANT

[Series 1: us abdomen limited · 0.30mm/px · 14 of 35 slices shown]
[im 1/35]
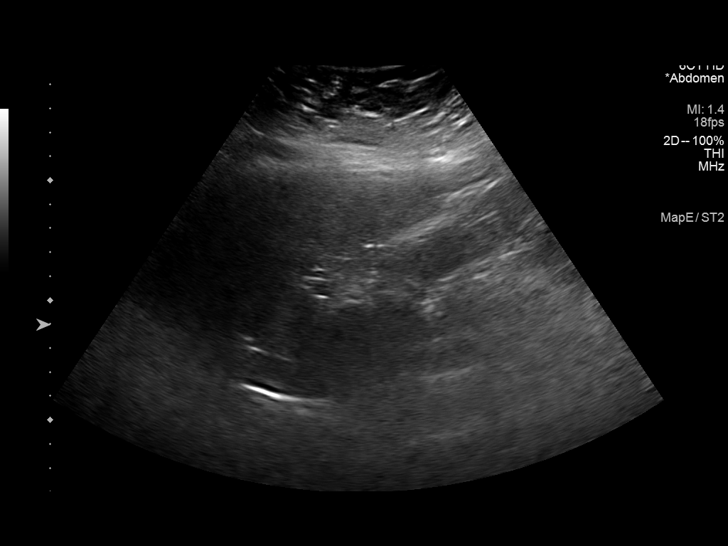
[im 3/35]
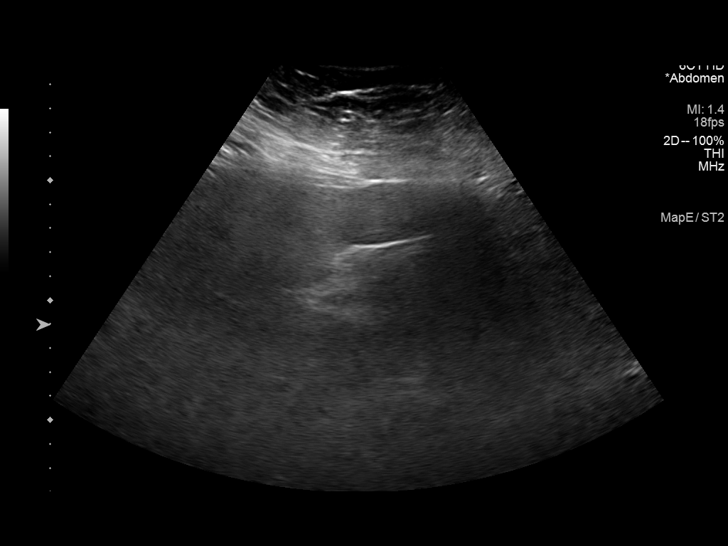
[im 6/35]
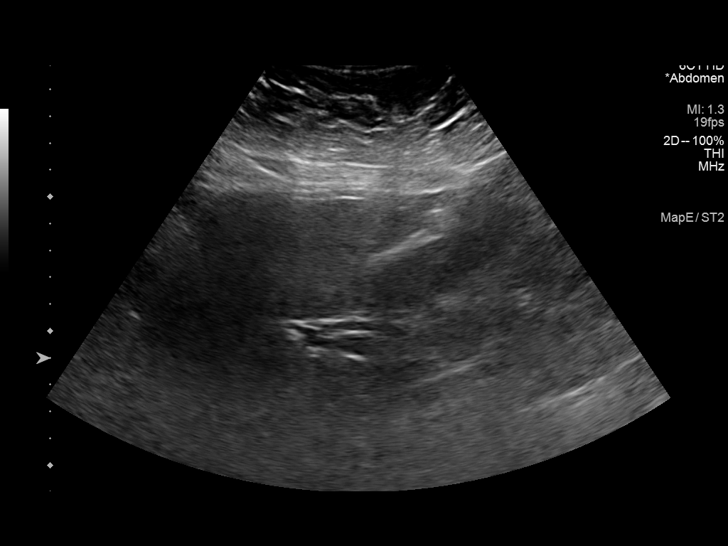
[im 9/35]
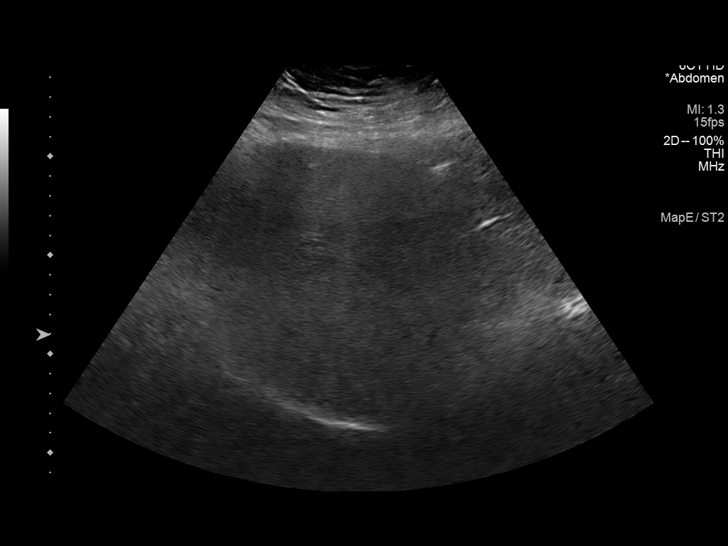
[im 12/35]
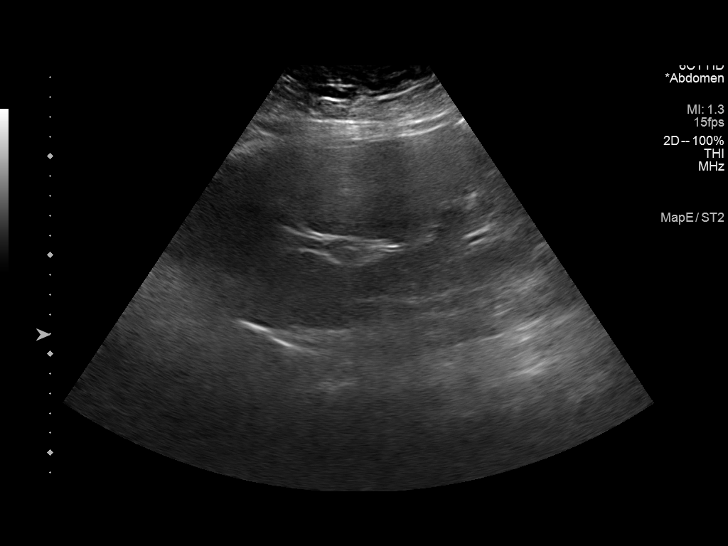
[im 13/35]
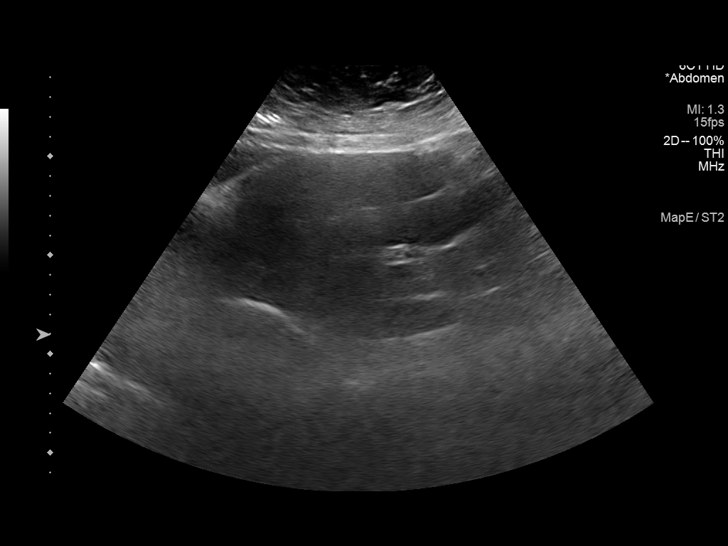
[im 16/35]
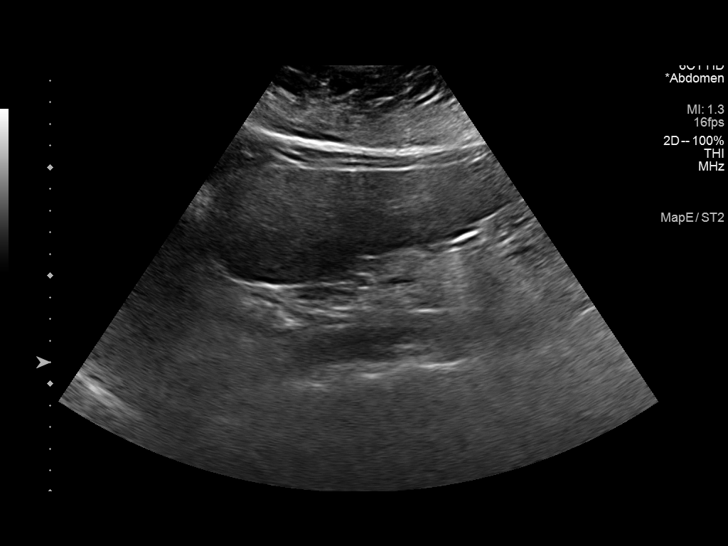
[im 19/35]
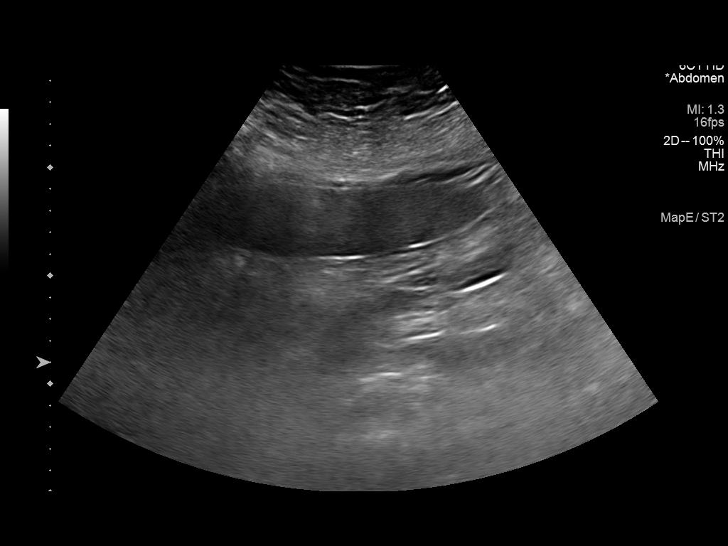
[im 22/35]
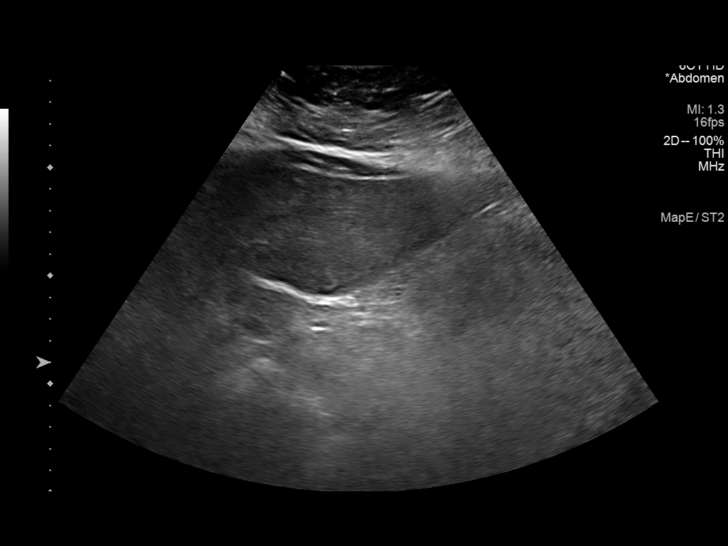
[im 23/35]
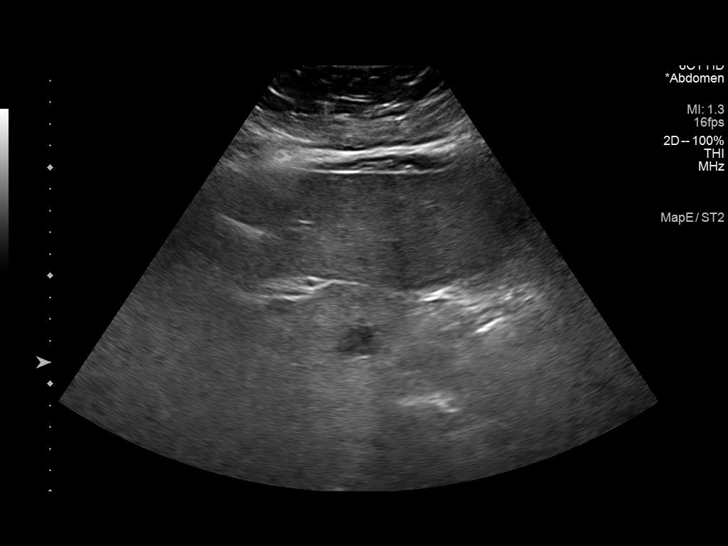
[im 26/35]
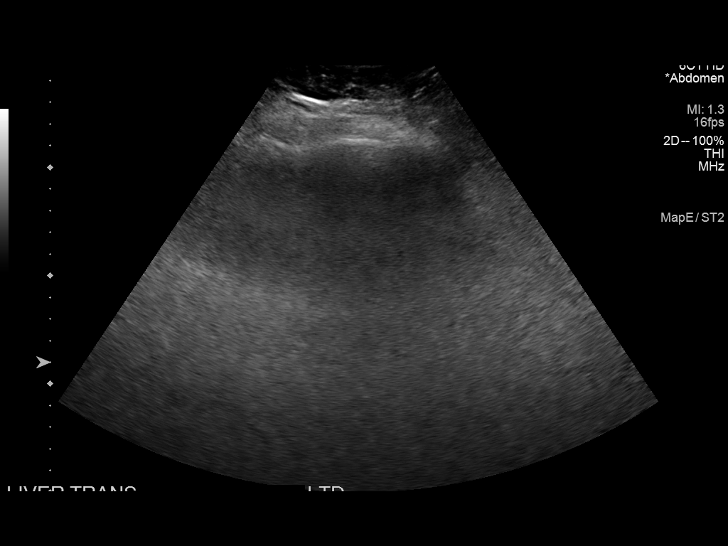
[im 29/35]
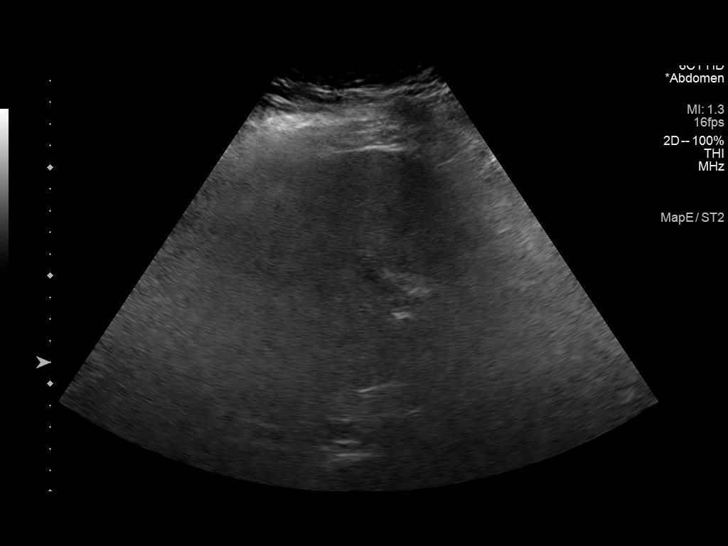
[im 32/35]
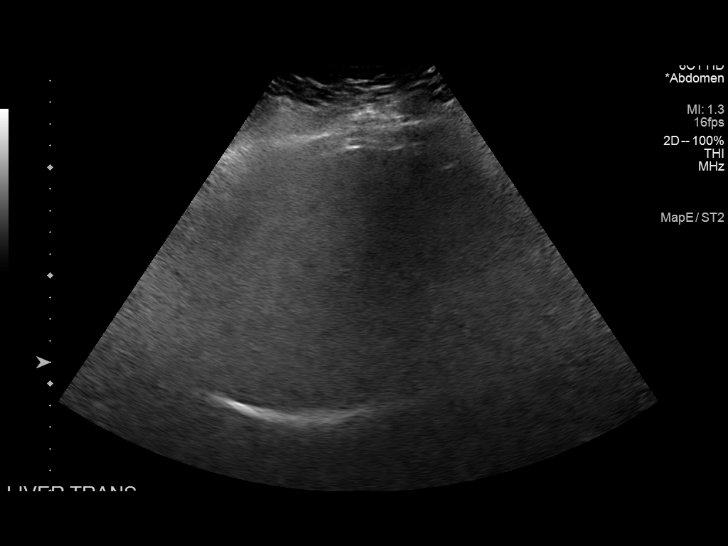
[im 35/35]
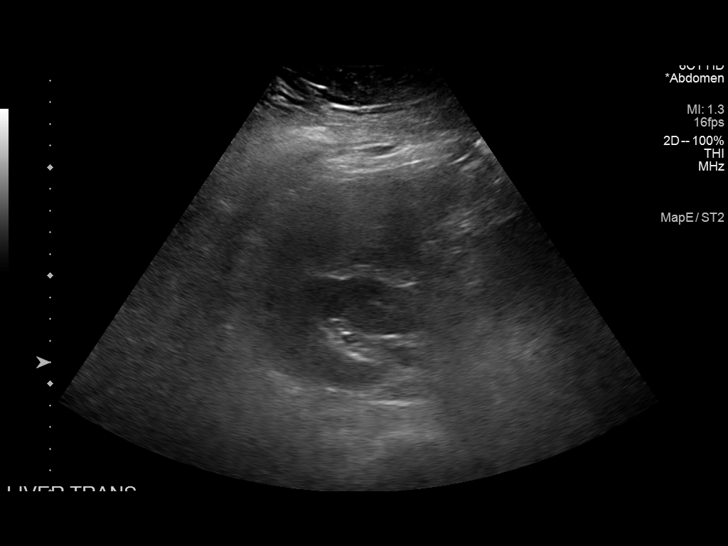

[14 of 25 positions shown; findings below may reference images not displayed]

FINDINGS: Gallbladder:

Surgically absent

Common bile duct:

Diameter: 3 mm

Liver:

Diffusely increased in echogenicity. No focal lesion identified.
Portal vein is patent on color Doppler imaging with normal direction
of blood flow towards the liver.

Other: None.
IMPRESSION: Hepatic steatosis.

## 2022-11-29 ENCOUNTER — Encounter (HOSPITAL_COMMUNITY): Payer: Medicaid Other

## 2022-11-29 NOTE — Progress Notes (Signed)
Cardiac Individual Treatment Plan  Patient Details  Name: [name] MRN: [id number] Date of Birth: 1976/01/21 Referring Provider:   Flowsheet Row CARDIAC REHAB PHASE II ORIENTATION from 10/10/2022 in [facility]  Referring Provider Diamond Nickel, MD  Quintella Reichert, MD]       Initial Encounter Date:  Flowsheet Row CARDIAC REHAB PHASE II ORIENTATION from 10/10/2022 in [facility]  Date 10/10/22       Visit Diagnosis: 06/13/22 DES LAD  Patient's Home Medications on Admission:  Current Outpatient Medications:    amLODipine (NORVASC) 10 MG tablet, Take 1 tablet (10 mg total) by mouth daily., Disp: 90 tablet, Rfl: 0   atorvastatin (LIPITOR) 40 MG tablet, Take 40 mg by mouth daily., Disp: , Rfl:    buPROPion (WELLBUTRIN XL) 150 MG 24 hr tablet, Take 1 tablet (150 mg total) by mouth daily., Disp: 90 tablet, Rfl: 1   clopidogrel (PLAVIX) 75 MG tablet, Take 1 tablet by mouth daily., Disp: , Rfl:    diclofenac Sodium (VOLTAREN) 1 % GEL, Apply 2 g topically 4 (four) times daily., Disp: 100 g, Rfl: 2   estradiol (ESTRACE) 2 MG tablet, Take 1 tablet (2 mg total) by mouth daily., Disp: 30 tablet, Rfl: 1   hydrochlorothiazide (HYDRODIURIL) 25 MG tablet, Take 1 tablet (25 mg total) by mouth every morning., Disp: 90 tablet, Rfl: 1   losartan (COZAAR) 100 MG tablet, Take 1 tablet (100 mg total) by mouth daily., Disp: 90 tablet, Rfl: 1   Magnesium Gluconate 250 MG TABS, Take 1 tablet (250 mg total) by mouth every evening. Take with evening meal, Disp: 90 tablet, Rfl: 1   methocarbamol (ROBAXIN-750) 750 MG tablet, Take 1 tablet (750 mg total) by mouth 2 (two) times daily as needed for muscle spasms., Disp: 20 tablet, Rfl: 2   pantoprazole (PROTONIX) 40 MG tablet, Take 1 tablet (40 mg total) by mouth daily., Disp: 90 tablet, Rfl: 1   potassium chloride SA (KLOR-CON M) 20 MEQ  tablet, Take 20 mEq by mouth 2 (two) times daily. 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 - states BP is under control; has been on med. x 12 yr.   Migraines    Right tennis elbow 11/21/2013    Tobacco Use: Social History   Tobacco Use  Smoking Status Never  Smokeless Tobacco Never    Labs: Review Flowsheet       Latest Ref Rng & Units 12/12/2007 08/01/2021 05/03/2022 09/04/2022  Labs for ITP Cardiac and Pulmonary Rehab  Cholestrol 100 - 199 mg/dL - - - 865   LDL (calc) 0 - 99 mg/dL - - - 75   HDL-C >78 mg/dL - - - 44   Trlycerides 0 - 149  mg/dL - - - 147   Hemoglobin A1c 4.8 - 5.6 % - 6.1  5.8  6.1   TCO2 - 22  - - -    Capillary Blood Glucose: No results found for: "GLUCAP"   Exercise Target Goals: Exercise Program Goal: Individual exercise prescription set using results from initial 6 min walk test and THRR while considering  patient's activity barriers and safety.   Exercise Prescription Goal: Initial exercise prescription builds to 30-45 minutes a day of aerobic activity, 2-3 days per week.  Home exercise guidelines will be given to patient during program as part of exercise prescription that the participant will acknowledge.  Activity Barriers & Risk Stratification:  Activity Barriers & Cardiac Risk Stratification - 10/10/22 1331       Activity Barriers & Cardiac Risk Stratification   Activity Barriers  Neck/Spine Problems;Other (comment)    Comments Spinal stenosis-chronic pain, lamenectomy L4-L5, no overhead press. Bilateral carpal tunnel release.    Cardiac Risk Stratification Moderate             6 Minute Walk:  6 Minute Walk     Row Name 10/10/22 1119         6 Minute Walk   Phase Initial     Distance 1197 feet     Walk Time 6 minutes     # of Rest Breaks 0     MPH 2.27     METS 3.03     RPE 10     Perceived Dyspnea  1     VO2 Peak 10.59     Symptoms Yes (comment)     Comments Mild SOB, RPD=1     Resting HR 69 bpm     Resting BP 128/90     Resting Oxygen Saturation  99 %     Exercise Oxygen Saturation  during 6 min walk 98 %     Max Ex. HR 86 bpm     Max Ex. BP 138/84     2 Minute Post BP 122/88              Oxygen Initial Assessment:   Oxygen Re-Evaluation:   Oxygen Discharge (Final Oxygen Re-Evaluation):   Initial Exercise Prescription:  Initial Exercise Prescription - 10/10/22 1300       Date of Initial Exercise RX and Referring Provider   Date 10/10/22    Referring Provider Diamond Nickel, MD   Quintella Reichert, MD   Expected Discharge Date 12/12/22      Treadmill   MPH 1.7    Grade 0    Minutes 15    METs 2.3      NuStep   Level 2    SPM 80    Minutes 15    METs 2.2      Prescription Details   Frequency (times per week) 2    Duration Progress to 30 minutes of continuous aerobic without signs/symptoms of physical distress      Intensity   THRR 40-80% of Max Heartrate 70-141    Ratings of Perceived Exertion 11-13    Perceived Dyspnea 0-4      Progression   Progression Continue to progress workloads to maintain intensity without signs/symptoms of physical distress.      Resistance Training   Training Prescription Yes    Weight 2 lbs    Reps 10-15             Perform Capillary Blood Glucose checks as needed.  Exercise  Prescription Changes:   Exercise Prescription Changes     Row Name 10/16/22 1631 10/30/22  1706 11/17/22 1455 11/27/22 1708       Response to Exercise   Blood Pressure (Admit) 114/74 120/76 118/76 134/80    Blood Pressure (Exercise) 128/80 154/80 160/82 144/72    Blood Pressure (Exit) 112/66 120/78 102/66 102/68    Heart Rate (Admit) 78 bpm 65 bpm 69 bpm 75 bpm    Heart Rate (Exercise) 107 bpm 111 bpm 108 bpm 108 bpm    Heart Rate (Exit) 86 bpm 76 bpm 74 bpm 78 bpm    Rating of Perceived Exertion (Exercise) 11.5 12 10 12     Perceived Dyspnea (Exercise) 0 0 0 0    Symptoms 0 0 0 0    Comments Pt first day in teh CRP2 program Reviewed MET's, goals and home ExRx Reviewed METs with patient. Patient increased WL on TM today. Thirt minutes on TM due to back pain. REVD MET's and goals    Duration Progress to 30 minutes of  aerobic without signs/symptoms of physical distress Progress to 30 minutes of  aerobic without signs/symptoms of physical distress Progress to 30 minutes of  aerobic without signs/symptoms of physical distress Progress to 30 minutes of  aerobic without signs/symptoms of physical distress    Intensity THRR unchanged THRR unchanged THRR unchanged THRR unchanged      Progression   Progression Continue to progress workloads to maintain intensity without signs/symptoms of physical distress. Continue to progress workloads to maintain intensity without signs/symptoms of physical distress. Continue to progress workloads to maintain intensity without signs/symptoms of physical distress. Continue to progress workloads to maintain intensity without signs/symptoms of physical distress.    Average METs 2.3 2.1 2.4 3.42      Resistance Training   Training Prescription Yes Yes Yes Yes    Weight 2 lbs 2 lbs 2 lbs 2 lbs    Reps 10-15 10-15 10-15 10-15    Time 10 Minutes 10 Minutes 10 Minutes 10 Minutes      Treadmill   MPH 1.7 1.7 1.8 2    Grade 0 0 0 0    Minutes 15 15 30 30     METs 2.3 2.3 2.38 2.53      NuStep   Level 2 2 -- --    SPM -- 80 -- --    Minutes 15 15 -- --     METs 1.8 1.9 -- --      Home Exercise Plan   Plans to continue exercise at -- Home (comment) Home (comment) Home (comment)    Frequency -- Add 4 additional days to program exercise sessions. Add 4 additional days to program exercise sessions. Add 4 additional days to program exercise sessions.    Initial Home Exercises Provided -- 10/30/22 10/30/22 10/30/22             Exercise Comments:   Exercise Comments     Row Name 10/16/22 1637 10/30/22 1713 11/17/22 1540 11/27/22 1715     Exercise Comments Pt first day in the CRP2 program. Pt tolerated exercise well with an average MET level of 2.3. Pt is learning her THRR, RPE and ExRx. Will continue to monitor pt and progress workloads as tolerated without sign or symptom Reviewed MET's, goals and homeExRx. Pt tolerated exercise well with an average MET level of 2.1. Pt feels good about her goals of being more active, and she has been working on eating healthier and adding in  more veggies. Overall pt feels good about her progress and about adopting a healthier lifestyle. Pt has already been doing PT exercises at home daily for about 15 mins, but encourage adding in more time by trying some yourtube chair fitness classes or light handweights on some of the days when shes not here so that she get in 5-7 days of 30 mins of physical activity Reviewed METs and goals with patient. Reviewed METs and goals. Pt tolerated exercise well with an average MET level of 3.42. Pt feels good with her goals of living better and eating well. She does have some back issues that cause her pain and limits some activities, but overall she feels good with her goals and has all the resources she needs             Exercise Goals and Review:   Exercise Goals     Row Name 10/10/22 1105             Exercise Goals   Increase Physical Activity Yes       Intervention Provide advice, education, support and counseling about physical activity/exercise needs.;Develop  an individualized exercise prescription for aerobic and resistive training based on initial evaluation findings, risk stratification, comorbidities and participant's personal goals.       Expected Outcomes Short Term: Attend rehab on a regular basis to increase amount of physical activity.;Long Term: Add in home exercise to make exercise part of routine and to increase amount of physical activity.;Long Term: Exercising regularly at least 3-5 days a week.       Increase Strength and Stamina Yes       Intervention Provide advice, education, support and counseling about physical activity/exercise needs.;Develop an individualized exercise prescription for aerobic and resistive training based on initial evaluation findings, risk stratification, comorbidities and participant's personal goals.       Expected Outcomes Short Term: Increase workloads from initial exercise prescription for resistance, speed, and METs.;Short Term: Perform resistance training exercises routinely during rehab and add in resistance training at home;Long Term: Improve cardiorespiratory fitness, muscular endurance and strength as measured by increased METs and functional capacity ( )       Able to understand and use rate of perceived exertion (RPE) scale Yes       Intervention Provide education and explanation on how to use RPE scale       Expected Outcomes Short Term: Able to use RPE daily in rehab to express subjective intensity level;Long Term:  Able to use RPE to guide intensity level when exercising independently       Knowledge and understanding of Target Heart Rate Range (THRR) Yes       Intervention Provide education and explanation of THRR including how the numbers were predicted and where they are located for reference       Expected Outcomes Short Term: Able to state/look up THRR;Long Term: Able to use THRR to govern intensity when exercising independently;Short Term: Able to use daily as guideline for intensity in rehab        Able to check pulse independently Yes       Intervention Provide education and demonstration on how to check pulse in carotid and radial arteries.;Review the importance of being able to check your own pulse for safety during independent exercise       Expected Outcomes Short Term: Able to explain why pulse checking is important during independent exercise;Long Term: Able to check pulse independently and accurately  Understanding of Exercise Prescription Yes       Intervention Provide education, explanation, and written materials on patient's individual exercise prescription       Expected Outcomes Short Term: Able to explain program exercise prescription;Long Term: Able to explain home exercise prescription to exercise independently                Exercise Goals Re-Evaluation :  Exercise Goals Re-Evaluation     Row Name 10/16/22 1635 10/30/22 1709 11/17/22 1540 11/27/22 1710       Exercise Goal Re-Evaluation   Exercise Goals Review Increase Physical Activity;Understanding of Exercise Prescription;Increase Strength and Stamina;Knowledge and understanding of Target Heart Rate Range (THRR);Able to understand and use rate of perceived exertion (RPE) scale Increase Physical Activity;Understanding of Exercise Prescription;Increase Strength and Stamina;Knowledge and understanding of Target Heart Rate Range (THRR);Able to understand and use rate of perceived exertion (RPE) scale Increase Physical Activity;Understanding of Exercise Prescription;Increase Strength and Stamina;Knowledge and understanding of Target Heart Rate Range (THRR);Able to understand and use rate of perceived exertion (RPE) scale Increase Physical Activity;Understanding of Exercise Prescription;Increase Strength and Stamina;Knowledge and understanding of Target Heart Rate Range (THRR);Able to understand and use rate of perceived exertion (RPE) scale    Comments Pt first day in the CRP2 program. Pt tolerated exercise well with  an average MET level of 2.3. Pt is learning her THRR, RPE and ExRx Reviewed MET's, goals and homeExRx. Pt tolerated exercise well with an average MET level of 2.1. Pt feels good about her goals of being more active, and she has been working on eating healthier and adding in more veggies. Overall pt feels good about her progress and about adopting a healthier lifestyle. Pt has already been doing PT exercises at home daily for about 15 mins, but encourage adding in more time by trying some yourtube chair fitness classes or light handweights on some of the days when shes not here so that she get in 5-7 days of 30 mins of physical activity Patient has been having ongoing back pain, possibly nerve issue, therefore unable to do the NuStep. Switched to TM 30 minutes. Per patient, she's having a hard time reaching overhead in cabinets. No overhead weights or stretching at cardiac rehab. Will increase TM to 2.0/0  at next session. Her goal is to be more active, which is a challenge at home because she's tired after work. Coming here helps increase her activity level. Reviewed METs and goals. Pt tolerated exercise well with an average MET level of 3.42. Pt feels good with her goals of living better and eating well. She does have some back issues that cause her pain and limits some activities, but overall she feels good with her goals and has all the resources she needs    Expected Outcomes Will continue to monitor pt and progress workloads as tolerated without sign or symptom Will continue to monitor pt and progress workloads as tolerated without sign or symptom Increase workload on TM at next session. Continue to monitor back pain, and adjust exercise as needed. Will continue to monitor pt and progress workloads as tolerated without sign or symptom             Discharge Exercise Prescription (Final Exercise Prescription Changes):  Exercise Prescription Changes - 11/27/22 1708       Response to Exercise   Blood  Pressure (Admit) 134/80    Blood Pressure (Exercise) 144/72    Blood Pressure (Exit) 102/68    Heart Rate (Admit) 75  bpm    Heart Rate (Exercise) 108 bpm    Heart Rate (Exit) 78 bpm    Rating of Perceived Exertion (Exercise) 12    Perceived Dyspnea (Exercise) 0    Symptoms 0    Comments REVD MET's and goals    Duration Progress to 30 minutes of  aerobic without signs/symptoms of physical distress    Intensity THRR unchanged      Progression   Progression Continue to progress workloads to maintain intensity without signs/symptoms of physical distress.    Average METs 3.42      Resistance Training   Training Prescription Yes    Weight 2 lbs    Reps 10-15    Time 10 Minutes      Treadmill   MPH 2    Grade 0    Minutes 30    METs 2.53      Home Exercise Plan   Plans to continue exercise at Home (comment)    Frequency Add 4 additional days to program exercise sessions.    Initial Home Exercises Provided 10/30/22             Nutrition:  Target Goals: Understanding of nutrition guidelines, daily intake of sodium 1500mg , cholesterol 200mg , calories 30% from fat and 7% or less from saturated fats, daily to have 5 or more servings of fruits and vegetables.  Biometrics:  Pre Biometrics - 10/10/22 0931       Pre Biometrics   Waist Circumference 52 inches    Hip Circumference 59.5 inches    Waist to Hip Ratio 0.87 %    Triceps Skinfold 45 mm    % Body Fat 56.8 %    Grip Strength 28 kg    Flexibility 10.5 in    Single Leg Stand 30 seconds              Nutrition Therapy Plan and Nutrition Goals:  Nutrition Therapy & Goals - 11/13/22 1551       Nutrition Therapy   Diet Heart healthy diet    Drug/Food Interactions Statins/Certain Fruits      Personal Nutrition Goals   Nutrition Goal Patient to identify strategies for reducing cardiovascular risk by attending the [employer] education and nutrition series weekly.    Personal Goal #2 Patient to improve diet  quality by using the plate method as a guide for meal planning to include lean protein/plant protein, fruits, vegetables, whole grains, nonfat dairy as part of a well-balanced diet.    Personal Goal #3 Patient to identify strategies for weight loss of 0.5-2.0# per week.    Personal Goal #4 Patient to limit sodium to 1500mg  per day    Comments Goals in action. [name] continues to attend the [employer] education and nutrition series. She has started making many dietary changes including eliminated sugary drinks, decreased sweets/refined carbohydrates, and increased vegetables. She is getting her family involved in her lifestyle changes. [name] has been working with [facility] in preparation for future bariatric surgery ([facility]) She will continue to benefit from participation in intensive cardiac rehab for nutrition, exercise, and lifestyle modification.      Intervention Plan   Intervention Prescribe, educate and counsel regarding individualized specific dietary modifications aiming towards targeted core components such as weight, hypertension, lipid management, diabetes, heart failure and other comorbidities.;Nutrition handout(s) given to patient.    Expected Outcomes Short Term Goal: Understand basic principles of dietary content, such as calories, fat, sodium, cholesterol and nutrients.;Long Term Goal: Adherence to prescribed  nutrition plan.             Nutrition Assessments:  Nutrition Assessments - 10/11/22 1112       Rate Your Plate Scores   Pre Score 60            MEDIFICTS Score Key: ?70 Need to make dietary changes  40-70 Heart Healthy Diet ? 40 Therapeutic Level Cholesterol Diet   Flowsheet Row CARDIAC REHAB PHASE II ORIENTATION from 10/10/2022 in [facility]  Picture Your Plate Total Score on Admission 60      Picture Your Plate Scores: <16 Unhealthy dietary pattern with much room for improvement. 41-50  Dietary pattern unlikely to meet recommendations for good health and room for improvement. 51-60 More healthful dietary pattern, with some room for improvement.  >60 Healthy dietary pattern, although there may be some specific behaviors that could be improved.    Nutrition Goals Re-Evaluation:  Nutrition Goals Re-Evaluation     Row Name 10/11/22 1108 11/13/22 1551           Goals   Current Weight -- 251 lb 5.2 oz (114 kg)      Comment A1c 6.1, triglycerides 176 no new labs; most recent labs  A1c 6.1, triglycerides 176      Expected Outcome Shanessa has been working with [facility] in preparation for future bariatric surgery. She will continue to benefit from participation in intensive cardiac rehab for nutrition, exercise, and lifestyle modification. Goals in action. [name] continues to attend the [employer] education and nutrition series. She has started making many dietary changes including eliminated sugary drinks, decreased sweets/refined carbohydrates, and increased vegetables. She is getting her family involved in her lifestyle changes. [name] has been working with [facility] in preparation for future bariatric surgery ([facility]) She will continue to benefit from participation in intensive cardiac rehab for nutrition, exercise, and lifestyle modification.               Nutrition Goals Re-Evaluation:  Nutrition Goals Re-Evaluation     Row Name 10/11/22 1108 11/13/22 1551           Goals   Current Weight -- 251 lb 5.2 oz (114 kg)      Comment A1c 6.1, triglycerides 176 no new labs; most recent labs  A1c 6.1, triglycerides 176      Expected Outcome [name] has been working with [facility] in preparation for future bariatric surgery. She will continue to benefit from participation in intensive cardiac rehab for nutrition, exercise, and lifestyle modification. Goals in action. [name] continues to attend the [employer] education and nutrition series. She has started  making many dietary changes including eliminated sugary drinks, decreased sweets/refined carbohydrates, and increased vegetables. She is getting her family involved in her lifestyle changes. Ersilia has been working with [facility] in preparation for future bariatric surgery ([facility]) She will continue to benefit from participation in intensive cardiac rehab for nutrition, exercise, and lifestyle modification.               Nutrition Goals Discharge (Final Nutrition Goals Re-Evaluation):  Nutrition Goals Re-Evaluation - 11/13/22 1551       Goals   Current Weight 251 lb 5.2 oz (114 kg)    Comment no new labs; most recent labs  A1c 6.1, triglycerides 176    Expected Outcome Goals in action. [name] continues to attend the [employer] education and nutrition series. She has started making many  dietary changes including eliminated sugary drinks, decreased sweets/refined carbohydrates, and increased vegetables. She is getting her family involved in her lifestyle changes. [name] has been working with [facility] in preparation for future bariatric surgery ([facility]) She will continue to benefit from participation in intensive cardiac rehab for nutrition, exercise, and lifestyle modification.             Psychosocial: Target Goals: Acknowledge presence or absence of significant depression and/or stress, maximize coping skills, provide positive support system. Participant is able to verbalize types and ability to use techniques and skills needed for reducing stress and depression.  Initial Review & Psychosocial Screening:  Initial Psych Review & Screening - 10/10/22 1456       Initial Review   Current issues with Current Depression      Family Dynamics   Good Support System? Yes   [name] has her 3 children for support. [name] lives with her family   Comments [name] reports that she has been experiencing some depression. Has received counselling in the past prior to the COVID 19 pandemic.  Appointment made for [name] to discuss current depression with PCP      Barriers   Psychosocial barriers to participate in program The patient should benefit from training in stress management and relaxation.      Screening Interventions   Interventions Encouraged to exercise;To provide support and resources with identified psychosocial needs;Provide feedback about the scores to participant    Expected Outcomes Long Term Goal: Stressors or current issues are controlled or eliminated.;Short Term goal: Utilizing psychosocial counselor, staff and physician to assist with identification of specific Stressors or current issues interfering with healing process. Setting desired goal for each stressor or current issue identified.;Short Term goal: Identification and review with participant of any Quality of Life or Depression concerns found by scoring the questionnaire.;Long Term goal: The participant improves quality of Life and PHQ9 Scores as seen by post scores and/or verbalization of changes             Quality of Life Scores:  Quality of Life - 10/10/22 1218       Quality of Life   Select Quality of Life      Quality of Life Scores   Health/Function Pre 19.17 %    Socioeconomic Pre 23.86 %    Psych/Spiritual Pre 14.64 %    Family Pre 18 %    GLOBAL Pre 19.03 %            Scores of 19 and below usually indicate a poorer quality of life in these areas.  A difference of  2-3 points is a clinically meaningful difference.  A difference of 2-3 points in the total score of the Quality of Life Index has been associated with significant improvement in overall quality of life, self-image, physical symptoms, and general health in studies assessing change in quality of life.  PHQ-9: Review Flowsheet  More data exists      11/09/2022 10/12/2022 10/10/2022 09/04/2022 05/03/2022  Depression screen PHQ 2/9  Decreased Interest 1 2 2  0 0 0 0  Down, Depressed, Hopeless 1 2 2  0 0 0 0  PHQ - 2 Score 2  4 4  0 0 0 0  Altered sleeping 1 3 2 3  0  Tired, decreased energy 1 3 2 1  0  Change in appetite 0 0 0 0 0  Feeling bad or failure about yourself  1 1 1  0 0  Trouble concentrating 1 3 1  0 0  Moving slowly or fidgety/restless 0 0 0 0 0  Suicidal thoughts 0 0 0 0 0  PHQ-9 Score 6 14 10 4  0  Difficult doing work/chores Somewhat difficult Somewhat difficult Somewhat difficult Not difficult at all -   Interpretation of Total Score  Total Score Depression Severity:  1-4 = Minimal depression, 5-9 = Mild depression, 10-14 = Moderate depression, 15-19 = Moderately severe depression, 20-27 = Severe depression   Psychosocial Evaluation and Intervention:   Psychosocial Re-Evaluation:  Psychosocial Re-Evaluation     Row Name 10/17/22 0843 11/03/22 1718 11/29/22 1827         Psychosocial Re-Evaluation   Current issues with Current Depression Current Depression Current Depression     Comments [name] saw her PCP last week to discuss current state of depression was prescribed an antidepressant. Will review quality of life questionnaire, PHQ2-9 in the upcoing week. Quality of life questionnaire reviewed and was forwarded to her PCP. [name] has started taking an antidepressant she has not noticed any difference yet. Encouraged [name] to call her PCP about getting counselling she has not been scheduled yet . [name] has started taking an antidepressant she has not noticed any difference yet. Encouraged [name] to call her PCP about getting counselling she has not been scheduled yet     Expected Outcomes [name] will have decreased or controlled depression upon completion of intensive cardiac rehab [name] will have decreased or controlled depression upon completion of intensive cardiac rehab [name] will have decreased or controlled depression upon completion of intensive cardiac rehab     Interventions Stress management education;Encouraged to attend Cardiac Rehabilitation for the exercise;Relaxation education;Physician  referral Stress management education;Encouraged to attend Cardiac Rehabilitation for the exercise;Relaxation education;Physician referral Stress management education;Encouraged to attend Cardiac Rehabilitation for the exercise;Relaxation education;Physician referral     Continue Psychosocial Services  Follow up required by staff Follow up required by staff Follow up required by staff              Psychosocial Discharge (Final Psychosocial Re-Evaluation):  Psychosocial Re-Evaluation - 11/29/22 1827       Psychosocial Re-Evaluation   Current issues with Current Depression    Comments . [name] has started taking an antidepressant she has not noticed any difference yet. Encouraged [name] to call her PCP about getting counselling she has not been scheduled yet    Expected Outcomes [name] will have decreased or controlled depression upon completion of intensive cardiac rehab    Interventions Stress management education;Encouraged to attend Cardiac Rehabilitation for the exercise;Relaxation education;Physician referral    Continue Psychosocial Services  Follow up required by staff             Vocational Rehabilitation: Provide vocational rehab assistance to qualifying candidates.   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 Participant will state understanding/return demonstration of topics presented.    Education     Row Name 10/16/22 1600     Education   Cardiac Education Topics Pritikin   Glass blower/designer Nutrition   Nutrition  Workshop Label Reading   Instruction  Review Code 1- Verbalizes Understanding   Class Start Time 1400   Class Stop Time 1445   Class Time Calculation (min) 45 min    Row Name 10/20/22 0853     Education   Cardiac Education Topics Pritikin   Select Core Videos     Core Videos   Educator Dietitian   Select Nutrition   Instruction Review Code 1- Verbalizes Understanding   Class Start Time 1400   Class Stop Time 1446   Class Time Calculation (min) 46 min    Row Name 10/23/22 1500     Education   Cardiac Education Topics Pritikin   Select Workshops     Workshops   Educator Exercise Physiologist   Select Exercise   Exercise Workshop Exercise Basics: Building Your Action Plan   Instruction Review Code 1- Verbalizes Understanding   Class Start Time 1405   Class Stop Time 1452   Class Time Calculation (min) 47 min    Row Name 10/30/22 1500     Education   Cardiac Education Topics Pritikin   Psychologist, counselling   Select Nutrition   Nutrition Nutrition Action Plan   Instruction Review Code 1- Verbalizes Understanding   Class Start Time 1405   Class Stop Time 1440   Class Time Calculation (min) 35 min    Row Name 11/03/22 1400     Education   Cardiac Education Topics Pritikin   Psychologist, forensic Exercise Education   Exercise Education Move It!   Instruction Review Code 1- Verbalizes Understanding   Class Start Time 1359   Class Stop Time 1433   Class Time Calculation (min) 34 min    Row Name 11/06/22 1600     Education   Cardiac Education Topics Pritikin   Select Workshops     Workshops   Educator Exercise Physiologist   Select Psychosocial   Psychosocial Workshop Focused Goals, Sustainable Changes   Instruction Review Code 1- Verbalizes Understanding   Class Start Time 1400   Class Stop Time 1441   Class Time Calculation (min) 41 min    Row Name 11/13/22 1400     Education   Cardiac  Education Topics Pritikin   Psychologist, forensic Exercise Education   Exercise Education Biomechanial Limitations   Instruction Review Code 1- Verbalizes Understanding   Class Start Time 1407   Class Stop Time 1444   Class Time Calculation (min) 37 min    Row Name 11/17/22 1500     Education   Cardiac Education Topics Pritikin   Licensed conveyancer Nutrition   Nutrition Dining Out - Part 1   Instruction Review Code 1- Verbalizes Understanding   Class Start Time 1400   Class Stop Time 1445   Class Time Calculation (min) 45 min    Row Name 11/24/22 1500     Education   Cardiac Education Topics Pritikin   Licensed conveyancer Nutrition   Nutrition Vitamins and Minerals   Instruction Review Code 1- Verbalizes Understanding   Class Start Time 1400   Class Stop Time 1440   Class  Time Calculation (min) 40 min    Row Name 11/27/22 1700     Education   Cardiac Education Topics Pritikin   Geographical information systems officer Psychosocial   Psychosocial Workshop Healthy Sleep for a Healthy Heart   Instruction Review Code 1- Verbalizes Understanding   Class Start Time 1405   Class Stop Time 1502   Class Time Calculation (min) 57 min            Core Videos: Exercise    Move It!  Clinical staff conducted group or individual video education with verbal and written material and guidebook.  Patient learns the recommended Pritikin exercise program. Exercise with the goal of living a long, healthy life. Some of the health benefits of exercise include controlled diabetes, healthier blood pressure levels, improved cholesterol levels, improved heart and lung capacity, improved sleep, and better body composition. Everyone should speak with their doctor before starting or changing an exercise  routine.  Biomechanical Limitations Clinical staff conducted group or individual video education with verbal and written material and guidebook.  Patient learns how biomechanical limitations can impact exercise and how we can mitigate and possibly overcome limitations to have an impactful and balanced exercise routine.  Body Composition Clinical staff conducted group or individual video education with verbal and written material and guidebook.  Patient learns that body composition (ratio of muscle mass to fat mass) is a key component to assessing overall fitness, rather than body weight alone. Increased fat mass, especially visceral belly fat, can put [place] at increased risk for metabolic syndrome, type 2 diabetes, heart disease, and even death. It is recommended to combine diet and exercise (cardiovascular and resistance training) to improve your body composition. Seek guidance from your physician and exercise physiologist before implementing an exercise routine.  Exercise Action Plan Clinical staff conducted group or individual video education with verbal and written material and guidebook.  Patient learns the recommended strategies to achieve and enjoy long-term exercise adherence, including variety, self-motivation, self-efficacy, and positive decision making. Benefits of exercise include fitness, good health, weight management, more energy, better sleep, less stress, and overall well-being.  Medical   Heart Disease Risk Reduction Clinical staff conducted group or individual video education with verbal and written material and guidebook.  Patient learns our heart is our most vital organ as it circulates oxygen, nutrients, white blood cells, and hormones throughout the entire body, and carries waste away. Data supports a plant-based eating plan like the Pritikin Program for its effectiveness in slowing progression of and reversing heart disease. The video provides a number of recommendations to  address heart disease.   Metabolic Syndrome and Belly Fat  Clinical staff conducted group or individual video education with verbal and written material and guidebook.  Patient learns what metabolic syndrome is, how it leads to heart disease, and how one can reverse it and keep it from coming back. You have metabolic syndrome if you have 3 of the following 5 criteria: abdominal obesity, high blood pressure, high triglycerides, low HDL cholesterol, and high blood sugar.  Hypertension and Heart Disease Clinical staff conducted group or individual video education with verbal and written material and guidebook.  Patient learns that high blood pressure, or hypertension, is very common in the [place]. Hypertension is largely due to excessive salt intake, but other important risk factors include being overweight, physical inactivity, drinking too much alcohol, smoking, and not eating enough potassium from fruits and  vegetables. High blood pressure is a leading risk factor for heart attack, stroke, congestive heart failure, dementia, kidney failure, and premature death. Long-term effects of excessive salt intake include stiffening of the arteries and thickening of heart muscle and organ damage. Recommendations include ways to reduce hypertension and the risk of heart disease.  Diseases of Our Time - Focusing on Diabetes Clinical staff conducted group or individual video education with verbal and written material and guidebook.  Patient learns why the best way to stop diseases of our time is prevention, through food and other lifestyle changes. Medicine (such as prescription pills and surgeries) is often only a Band-Aid on the problem, not a long-term solution. Most common diseases of our time include obesity, type 2 diabetes, hypertension, heart disease, and cancer. The Pritikin Program is recommended and has been proven to help reduce, reverse, and/or prevent the damaging effects of metabolic  syndrome.  Nutrition   Overview of the Pritikin Eating Plan  Clinical staff conducted group or individual video education with verbal and written material and guidebook.  Patient learns about the Pritikin Eating Plan for disease risk reduction. The Pritikin Eating Plan emphasizes a wide variety of unrefined, minimally-processed carbohydrates, like fruits, vegetables, whole grains, and legumes. Go, Caution, and Stop food choices are explained. Plant-based and lean animal proteins are emphasized. Rationale provided for low sodium intake for blood pressure control, low added sugars for blood sugar stabilization, and low added fats and oils for coronary artery disease risk reduction and weight management.  Calorie Density  Clinical staff conducted group or individual video education with verbal and written material and guidebook.  Patient learns about calorie density and how it impacts the Pritikin Eating Plan. Knowing the characteristics of the food you choose will help you decide whether those foods will lead to weight gain or weight loss, and whether you want to consume more or less of them. Weight loss is usually a side effect of the Pritikin Eating Plan because of its focus on low calorie-dense foods.  Label Reading  Clinical staff conducted group or individual video education with verbal and written material and guidebook.  Patient learns about the Pritikin recommended label reading guidelines and corresponding recommendations regarding calorie density, added sugars, sodium content, and whole grains.  Dining Out - Part 1  Clinical staff conducted group or individual video education with verbal and written material and guidebook.  Patient learns that restaurant meals can be sabotaging because they can be so high in calories, fat, sodium, and/or sugar. Patient learns recommended strategies on how to positively address this and avoid unhealthy pitfalls.  Facts on Fats  Clinical staff conducted  group or individual video education with verbal and written material and guidebook.  Patient learns that lifestyle modifications can be just as effective, if not more so, as many medications for lowering your risk of heart disease. A Pritikin lifestyle can help to reduce your risk of inflammation and atherosclerosis (cholesterol build-up, or plaque, in the artery walls). Lifestyle interventions such as dietary choices and physical activity address the cause of atherosclerosis. A review of the types of fats and their impact on blood cholesterol levels, along with dietary recommendations to reduce fat intake is also included.  Nutrition Action Plan  Clinical staff conducted group or individual video education with verbal and written material and guidebook.  Patient learns how to incorporate Pritikin recommendations into their lifestyle. Recommendations include planning and keeping personal health goals in mind as an important part of their success.  Healthy Mind-Set    Healthy Minds, Bodies, Hearts  Clinical staff conducted group or individual video education with verbal and written material and guidebook.  Patient learns how to identify when they are stressed. Video will discuss the impact of that stress, as well as the many benefits of stress management. Patient will also be introduced to stress management techniques. The way we think, act, and feel has an impact on our hearts.  How Our Thoughts Can Heal Our Hearts  Clinical staff conducted group or individual video education with verbal and written material and guidebook.  Patient learns that negative thoughts can cause depression and anxiety. This can result in negative lifestyle behavior and serious health problems. Cognitive behavioral therapy is an effective method to help control our thoughts in order to change and improve our emotional outlook.  Additional Videos:  Exercise    Improving Performance  Clinical staff conducted group or  individual video education with verbal and written material and guidebook.  Patient learns to use a non-linear approach by alternating intensity levels and lengths of time spent exercising to help burn more calories and lose more body fat. Cardiovascular exercise helps improve heart health, metabolism, hormonal balance, blood sugar control, and recovery from fatigue. Resistance training improves strength, endurance, balance, coordination, reaction time, metabolism, and muscle mass. Flexibility exercise improves circulation, posture, and balance. Seek guidance from your physician and exercise physiologist before implementing an exercise routine and learn your capabilities and proper form for all exercise.  Introduction to Yoga  Clinical staff conducted group or individual video education with verbal and written material and guidebook.  Patient learns about yoga, a discipline of the coming together of mind, breath, and body. The benefits of yoga include improved flexibility, improved range of motion, better posture and core strength, increased lung function, weight loss, and positive self-image. Yoga's heart health benefits include lowered blood pressure, healthier heart rate, decreased cholesterol and triglyceride levels, improved immune function, and reduced stress. Seek guidance from your physician and exercise physiologist before implementing an exercise routine and learn your capabilities and proper form for all exercise.  Medical   Aging: Enhancing Your Quality of Life  Clinical staff conducted group or individual video education with verbal and written material and guidebook.  Patient learns key strategies and recommendations to stay in good physical health and enhance quality of life, such as prevention strategies, having an advocate, securing a Health Care Proxy and Power of Attorney, and keeping a list of medications and system for tracking them. It also discusses how to avoid risk for bone  loss.  Biology of Weight Control  Clinical staff conducted group or individual video education with verbal and written material and guidebook.  Patient learns that weight gain occurs because we consume more calories than we burn (eating more, moving less). Even if your body weight is normal, you may have higher ratios of fat compared to muscle mass. Too much body fat puts you at increased risk for cardiovascular disease, heart attack, stroke, type 2 diabetes, and obesity-related cancers. In addition to exercise, following the Pritikin Eating Plan can help reduce your risk.  Decoding Lab Results  Clinical staff conducted group or individual video education with verbal and written material and guidebook.  Patient learns that lab test reflects one measurement whose values change over time and are influenced by many factors, including medication, stress, sleep, exercise, food, hydration, pre-existing medical conditions, and more. It is recommended to use the knowledge from this video to become more  involved with your lab results and evaluate your numbers to speak with your doctor.   Diseases of Our Time - Overview  Clinical staff conducted group or individual video education with verbal and written material and guidebook.  Patient learns that according to the CDC, 50% to 70% of chronic diseases (such as obesity, type 2 diabetes, elevated lipids, hypertension, and heart disease) are avoidable through lifestyle improvements including healthier food choices, listening to satiety cues, and increased physical activity.  Sleep Disorders Clinical staff conducted group or individual video education with verbal and written material and guidebook.  Patient learns how good quality and duration of sleep are important to overall health and well-being. Patient also learns about sleep disorders and how they impact health along with recommendations to address them, including discussing with a physician.  Nutrition   Dining Out - Part 2 Clinical staff conducted group or individual video education with verbal and written material and guidebook.  Patient learns how to plan ahead and communicate in order to maximize their dining experience in a healthy and nutritious manner. Included are recommended food choices based on the type of restaurant the patient is visiting.   Fueling a Banker conducted group or individual video education with verbal and written material and guidebook.  There is a strong connection between our food choices and our health. Diseases like obesity and type 2 diabetes are very prevalent and are in large-part due to lifestyle choices. The Pritikin Eating Plan provides plenty of food and hunger-curbing satisfaction. It is easy to follow, affordable, and helps reduce health risks.  Menu Workshop  Clinical staff conducted group or individual video education with verbal and written material and guidebook.  Patient learns that restaurant meals can sabotage health goals because they are often packed with calories, fat, sodium, and sugar. Recommendations include strategies to plan ahead and to communicate with the manager, chef, or server to help order a healthier meal.  Planning Your Eating Strategy  Clinical staff conducted group or individual video education with verbal and written material and guidebook.  Patient learns about the Pritikin Eating Plan and its benefit of reducing the risk of disease. The Pritikin Eating Plan does not focus on calories. Instead, it emphasizes high-quality, nutrient-rich foods. By knowing the characteristics of the foods, we choose, we can determine their calorie density and make informed decisions.  Targeting Your Nutrition Priorities  Clinical staff conducted group or individual video education with verbal and written material and guidebook.  Patient learns that lifestyle habits have a tremendous impact on disease risk and progression. This  video provides eating and physical activity recommendations based on your personal health goals, such as reducing LDL cholesterol, losing weight, preventing or controlling type 2 diabetes, and reducing high blood pressure.  Vitamins and Minerals  Clinical staff conducted group or individual video education with verbal and written material and guidebook.  Patient learns different ways to obtain key vitamins and minerals, including through a recommended healthy diet. It is important to discuss all supplements you take with your doctor.   Healthy Mind-Set    Smoking Cessation  Clinical staff conducted group or individual video education with verbal and written material and guidebook.  Patient learns that cigarette smoking and tobacco addiction pose a serious health risk which affects millions of people. Stopping smoking will significantly reduce the risk of heart disease, lung disease, and many forms of cancer. Recommended strategies for quitting are covered, including working with your doctor to develop a  successful plan.  Culinary   Becoming a Set designer conducted group or individual video education with verbal and written material and guidebook.  Patient learns that cooking at home can be healthy, cost-effective, quick, and puts them in control. Keys to cooking healthy recipes will include looking at your recipe, assessing your equipment needs, planning ahead, making it simple, choosing cost-effective seasonal ingredients, and limiting the use of added fats, salts, and sugars.  Cooking - Breakfast and Snacks  Clinical staff conducted group or individual video education with verbal and written material and guidebook.  Patient learns how important breakfast is to satiety and nutrition through the entire day. Recommendations include key foods to eat during breakfast to help stabilize blood sugar levels and to prevent overeating at meals later in the day. Planning ahead is also a  key component.  Cooking - Educational psychologist conducted group or individual video education with verbal and written material and guidebook.  Patient learns eating strategies to improve overall health, including an approach to cook more at home. Recommendations include thinking of animal protein as a side on your plate rather than center stage and focusing instead on lower calorie dense options like vegetables, fruits, whole grains, and plant-based proteins, such as beans. Making sauces in large quantities to freeze for later and leaving the skin on your vegetables are also recommended to maximize your experience.  Cooking - Healthy Salads and Dressing Clinical staff conducted group or individual video education with verbal and written material and guidebook.  Patient learns that vegetables, fruits, whole grains, and legumes are the foundations of the Pritikin Eating Plan. Recommendations include how to incorporate each of these in flavorful and healthy salads, and how to create homemade salad dressings. Proper handling of ingredients is also covered. Cooking - Soups and [employer] - Soups and Desserts Clinical staff conducted group or individual video education with verbal and written material and guidebook.  Patient learns that Pritikin soups and desserts make for easy, nutritious, and delicious snacks and meal components that are low in sodium, fat, sugar, and calorie density, while high in vitamins, minerals, and filling fiber. Recommendations include simple and healthy ideas for soups and desserts.   Overview     The Pritikin Solution Program Overview Clinical staff conducted group or individual video education with verbal and written material and guidebook.  Patient learns that the results of the Pritikin Program have been documented in more than 100 articles published in peer-reviewed journals, and the benefits include reducing risk factors for (and, in some cases, even  reversing) high cholesterol, high blood pressure, type 2 diabetes, obesity, and more! An overview of the three key pillars of the Pritikin Program will be covered: eating well, doing regular exercise, and having a healthy mind-set.  WORKSHOPS  Exercise: Exercise Basics: Building Your Action Plan Clinical staff led group instruction and group discussion with PowerPoint presentation and patient guidebook. To enhance the learning environment the use of posters, models and videos may be added. At the conclusion of this workshop, patients will comprehend the difference between physical activity and exercise, as well as the benefits of incorporating both, into their routine. Patients will understand the FITT (Frequency, Intensity, Time, and Type) principle and how to use it to build an exercise action plan. In addition, safety concerns and other considerations for exercise and cardiac rehab will be addressed by the presenter. The purpose of this lesson is to promote a comprehensive and effective weekly  exercise routine in order to improve patients' overall level of fitness.   Managing Heart Disease: Your Path to a Healthier Heart Clinical staff led group instruction and group discussion with PowerPoint presentation and patient guidebook. To enhance the learning environment the use of posters, models and videos may be added.At the conclusion of this workshop, patients will understand the anatomy and physiology of the heart. Additionally, they will understand how Pritikin's three pillars impact the risk factors, the progression, and the management of heart disease.  The purpose of this lesson is to provide a high-level overview of the heart, heart disease, and how the Pritikin lifestyle positively impacts risk factors.  Exercise Biomechanics Clinical staff led group instruction and group discussion with PowerPoint presentation and patient guidebook. To enhance the learning environment the use of  posters, models and videos may be added. Patients will learn how the structural parts of their bodies function and how these functions impact their daily activities, movement, and exercise. Patients will learn how to promote a neutral spine, learn how to manage pain, and identify ways to improve their physical movement in order to promote healthy living. The purpose of this lesson is to expose patients to common physical limitations that impact physical activity. Participants will learn practical ways to adapt and manage aches and pains, and to minimize their effect on regular exercise. Patients will learn how to maintain good posture while sitting, walking, and lifting.  Balance Training and Fall Prevention  Clinical staff led group instruction and group discussion with PowerPoint presentation and patient guidebook. To enhance the learning environment the use of posters, models and videos may be added. At the conclusion of this workshop, patients will understand the importance of their sensorimotor skills (vision, proprioception, and the vestibular system) in maintaining their ability to balance as they age. Patients will apply a variety of balancing exercises that are appropriate for their current level of function. Patients will understand the common causes for poor balance, possible solutions to these problems, and ways to modify their physical environment in order to minimize their fall risk. The purpose of this lesson is to teach patients about the importance of maintaining balance as they age and ways to minimize their risk of falling.  WORKSHOPS   Nutrition:  Fueling a Ship broker led group instruction and group discussion with PowerPoint presentation and patient guidebook. To enhance the learning environment the use of posters, models and videos may be added. Patients will review the foundational principles of the Pritikin Eating Plan and understand what constitutes a  serving size in each of the food groups. Patients will also learn Pritikin-friendly foods that are better choices when away from home and review make-ahead meal and snack options. Calorie density will be reviewed and applied to three nutrition priorities: weight maintenance, weight loss, and weight gain. The purpose of this lesson is to reinforce (in a group setting) the key concepts around what patients are recommended to eat and how to apply these guidelines when away from home by planning and selecting Pritikin-friendly options. Patients will understand how calorie density may be adjusted for different weight management goals.  Mindful Eating  Clinical staff led group instruction and group discussion with PowerPoint presentation and patient guidebook. To enhance the learning environment the use of posters, models and videos may be added. Patients will briefly review the concepts of the Pritikin Eating Plan and the importance of low-calorie dense foods. The concept of mindful eating will be introduced as well as the  importance of paying attention to internal hunger signals. Triggers for non-hunger eating and techniques for dealing with triggers will be explored. The purpose of this lesson is to provide patients with the opportunity to review the basic principles of the Pritikin Eating Plan, discuss the value of eating mindfully and how to measure internal cues of hunger and fullness using the Hunger Scale. Patients will also discuss reasons for non-hunger eating and learn strategies to use for controlling emotional eating.  Targeting Your Nutrition Priorities Clinical staff led group instruction and group discussion with PowerPoint presentation and patient guidebook. To enhance the learning environment the use of posters, models and videos may be added. Patients will learn how to determine their genetic susceptibility to disease by reviewing their family history. Patients will gain insight into the  importance of diet as part of an overall healthy lifestyle in mitigating the impact of genetics and other environmental insults. The purpose of this lesson is to provide patients with the opportunity to assess their personal nutrition priorities by looking at their family history, their own health history and current risk factors. Patients will also be able to discuss ways of prioritizing and modifying the Pritikin Eating Plan for their highest risk areas  Menu  Clinical staff led group instruction and group discussion with PowerPoint presentation and patient guidebook. To enhance the learning environment the use of posters, models and videos may be added. Using menus brought in from [employer], or printed from [employer], patients will apply the Pritikin dining out guidelines that were presented in the [employer] video. Patients will also be able to practice these guidelines in a variety of provided scenarios. The purpose of this lesson is to provide patients with the opportunity to practice hands-on learning of the Pritikin Dining Out guidelines with actual menus and practice scenarios.  Label Reading Clinical staff led group instruction and group discussion with PowerPoint presentation and patient guidebook. To enhance the learning environment the use of posters, models and videos may be added. Patients will review and discuss the Pritikin label reading guidelines presented in Pritikin's Label Reading Educational series video. Using fool labels brought in from local grocery stores and markets, patients will apply the label reading guidelines and determine if the packaged food meet the Pritikin guidelines. The purpose of this lesson is to provide patients with the opportunity to review, discuss, and practice hands-on learning of the Pritikin Label Reading guidelines with actual packaged food labels. Cooking School  Pritikin's [employer] are designed to teach  patients ways to prepare quick, simple, and affordable recipes at home. The importance of nutrition's role in chronic disease risk reduction is reflected in its emphasis in the overall Pritikin program. By learning how to prepare essential core Pritikin Eating Plan recipes, patients will increase control over what they eat; be able to customize the flavor of foods without the use of added salt, sugar, or fat; and improve the quality of the food they consume. By learning a set of core recipes which are easily assembled, quickly prepared, and affordable, patients are more likely to prepare more healthy foods at home. These workshops focus on convenient breakfasts, simple entres, side dishes, and desserts which can be prepared with minimal effort and are consistent with nutrition recommendations for cardiovascular risk reduction. Cooking [employer] are taught by a Armed forces logistics/support/administrative officer (RD) who has been trained by the [employer]. The chef or RD has a clear understanding of the importance of  minimizing - if not completely eliminating - added fat, sugar, and sodium in recipes. Throughout the series of Cooking School Workshop sessions, patients will learn about healthy ingredients and efficient methods of cooking to build confidence in their capability to prepare    Cooking School weekly topics:  Adding Flavor- Sodium-Free  Fast and Healthy Breakfasts  Powerhouse Plant-Based Proteins  Satisfying Salads and Dressings  Simple Sides and Sauces  International Cuisine-Spotlight on the [employer] Zones  Delicious Desserts  Savory Soups  [employer] - Meals in a Astronomer Appetizers and Snacks  Comforting Weekend Breakfasts  One-Pot Wonders   Fast Evening Meals  Landscape architect Your Pritikin Plate  WORKSHOPS   Healthy Mindset (Psychosocial):  Focused Goals, Sustainable Changes Clinical staff led group instruction and group discussion with PowerPoint  presentation and patient guidebook. To enhance the learning environment the use of posters, models and videos may be added. Patients will be able to apply effective goal setting strategies to establish at least one personal goal, and then take consistent, meaningful action toward that goal. They will learn to identify common barriers to achieving personal goals and develop strategies to overcome them. Patients will also gain an understanding of how our mind-set can impact our ability to achieve goals and the importance of cultivating a positive and growth-oriented mind-set. The purpose of this lesson is to provide patients with a deeper understanding of how to set and achieve personal goals, as well as the tools and strategies needed to overcome common obstacles which may arise along the way.  From Head to Heart: The Power of a Healthy Outlook  Clinical staff led group instruction and group discussion with PowerPoint presentation and patient guidebook. To enhance the learning environment the use of posters, models and videos may be added. Patients will be able to recognize and describe the impact of emotions and mood on physical health. They will discover the importance of self-care and explore self-care practices which may work for them. Patients will also learn how to utilize the 4 C's to cultivate a healthier outlook and better manage stress and challenges. The purpose of this lesson is to demonstrate to patients how a healthy outlook is an essential part of maintaining good health, especially as they continue their cardiac rehab journey.  Healthy Sleep for a Healthy Heart Clinical staff led group instruction and group discussion with PowerPoint presentation and patient guidebook. To enhance the learning environment the use of posters, models and videos may be added. At the conclusion of this workshop, patients will be able to demonstrate knowledge of the importance of sleep to overall health, well-being,  and quality of life. They will understand the symptoms of, and treatments for, common sleep disorders. Patients will also be able to identify daytime and nighttime behaviors which impact sleep, and they will be able to apply these tools to help manage sleep-related challenges. The purpose of this lesson is to provide patients with a general overview of sleep and outline the importance of quality sleep. Patients will learn about a few of the most common sleep disorders. Patients will also be introduced to the concept of "sleep hygiene," and discover ways to self-manage certain sleeping problems through simple daily behavior changes. Finally, the workshop will motivate patients by clarifying the links between quality sleep and their goals of heart-healthy living.   Recognizing and Reducing Stress Clinical staff led group instruction and group discussion with PowerPoint presentation and patient guidebook. To enhance the learning environment the use  of posters, models and videos may be added. At the conclusion of this workshop, patients will be able to understand the types of stress reactions, differentiate between acute and chronic stress, and recognize the impact that chronic stress has on their health. They will also be able to apply different coping mechanisms, such as reframing negative self-talk. Patients will have the opportunity to practice a variety of stress management techniques, such as deep abdominal breathing, progressive muscle relaxation, and/or guided imagery.  The purpose of this lesson is to educate patients on the role of stress in their lives and to provide healthy techniques for coping with it.  Learning Barriers/Preferences:  Learning Barriers/Preferences - 10/10/22 1105       Learning Barriers/Preferences   Learning Barriers None    Learning Preferences Written Material;Video;Audio;Group Instruction;Individual Instruction;Computer/Internet;Verbal Instruction              Education Topics:  Knowledge Questionnaire Score:  Knowledge Questionnaire Score - 10/10/22 1222       Knowledge Questionnaire Score   Pre Score 21/24             Core Components/Risk Factors/Patient Goals at Admission:  Personal Goals and Risk Factors at Admission - 10/10/22 1105       Core Components/Risk Factors/Patient Goals on Admission    Weight Management Yes;Obesity;Weight Loss    Intervention Weight Management/Obesity: Establish reasonable short term and long term weight goals.;Obesity: Provide education and appropriate resources to help participant work on and attain dietary goals.    Admit Weight 248 lb 10.9 oz (112.8 kg)    Expected Outcomes Short Term: Continue to assess and modify interventions until short term weight is achieved;Weight Loss: Understanding of general recommendations for a balanced deficit meal plan, which promotes 1-2 lb weight loss per week and includes a negative energy balance of [phone] kcal/d    Hypertension Yes    Intervention Provide education on lifestyle modifcations including regular physical activity/exercise, weight management, moderate sodium restriction and increased consumption of fresh fruit, vegetables, and low fat dairy, alcohol moderation, and smoking cessation.;Monitor prescription use compliance.    Expected Outcomes Short Term: Continued assessment and intervention until BP is < 140/71mm HG in hypertensive participants. < 130/56mm HG in hypertensive participants with diabetes, heart failure or chronic kidney disease.;Long Term: Maintenance of blood pressure at goal levels.    Lipids Yes    Intervention Provide education and support for participant on nutrition & aerobic/resistive exercise along with prescribed medications to achieve LDL 70mg , HDL >40mg .    Expected Outcomes Short Term: Participant states understanding of desired cholesterol values and is compliant with medications prescribed. Participant is following exercise  prescription and nutrition guidelines.;Long Term: Cholesterol controlled with medications as prescribed, with individualized exercise RX and with personalized nutrition plan. Value goals: LDL < 70mg , HDL > 40 mg.    Stress Yes    Intervention Offer individual and/or small group education and counseling on adjustment to heart disease, stress management and health-related lifestyle change. Teach and support self-help strategies.;Refer participants experiencing significant psychosocial distress to appropriate mental health specialists for further evaluation and treatment. When possible, include family members and significant others in education/counseling sessions.    Expected Outcomes Short Term: Participant demonstrates changes in health-related behavior, relaxation and other stress management skills, ability to obtain effective social support, and compliance with psychotropic medications if prescribed.;Long Term: Emotional wellbeing is indicated by absence of clinically significant psychosocial distress or social isolation.    Personal Goal Other Yes    Personal  Goal Learn to eat better for better health. Be more active.    Intervention Patient will attend nutrition and exercise workshops/ video education. Provide individualized nutrition and exercise plan that patient can follow.    Expected Outcomes Patient will be compliant with nutrtion and exercise plan.             Core Components/Risk Factors/Patient Goals Review:   Goals and Risk Factor Review     Row Name 10/17/22 0848 11/03/22 1721 11/29/22 1828         Core Components/Risk Factors/Patient Goals Review   Personal Goals Review Weight Management/Obesity;Hypertension;Stress Weight Management/Obesity;Hypertension;Stress Weight Management/Obesity;Hypertension;Stress     Review [name] started intensive cardiac rehab on 10/16/22 and did well with exercise. Vital signs were stable [name] is doing  well with exercise at intensive cardiac  rehab.. Vital signs have been stable. [name] is enjoying participating in the program [name] is doing  well with exercise at intensive cardiac rehab.. Vital signs have been stable. [name] is enjoying participating in the program.     Expected Outcomes [name] will continue to participate in intensive cardiac rehab for exercise, nutrition and lifestyle modifications [name] will continue to participate in intensive cardiac rehab for exercise, nutrition and lifestyle modifications [name] will continue to participate in intensive cardiac rehab for exercise, nutrition and lifestyle modifications              Core Components/Risk Factors/Patient Goals at Discharge (Final Review):   Goals and Risk Factor Review - 11/29/22 1828       Core Components/Risk Factors/Patient Goals Review   Personal Goals Review Weight Management/Obesity;Hypertension;Stress    Review Katia is doing  well with exercise at intensive cardiac rehab.. Vital signs have been stable. [name] is enjoying participating in the program.    Expected Outcomes [name] will continue to participate in intensive cardiac rehab for exercise, nutrition and lifestyle modifications             ITP Comments:  ITP Comments     Row Name 10/10/22 0931 10/17/22 0841 11/03/22 1717 11/29/22 1826     ITP Comments Medical Director- Dr. Armanda Magic, MD. Reviewd orientation folder with patient. 30 Day ITP Review. Caty started intensive cardiac rehab on 10/16/22 and did well with exercise 30 Day ITP Review. Shealee has good participation in  intensive cardiac when in attendance 30 Day ITP Review. Arlisa continues to have  good participation in  intensive cardiac when in attendance             Comments: See ITP comments.Thayer Headings RN BSN

## 2022-12-01 ENCOUNTER — Encounter (HOSPITAL_COMMUNITY): Payer: Medicaid Other

## 2022-12-01 ENCOUNTER — Encounter (HOSPITAL_COMMUNITY)
Admission: RE | Admit: 2022-12-01 | Discharge: 2022-12-01 | Disposition: A | Discharge status: Home / Self Care | Payer: Medicaid Other | Source: Ambulatory Visit | Attending: Cardiology | Admitting: Cardiology

## 2022-12-01 DIAGNOSIS — Z955 Presence of coronary angioplasty implant and graft: Secondary | ICD-10-CM | POA: Diagnosis not present

## 2022-12-04 ENCOUNTER — Encounter (HOSPITAL_COMMUNITY): Payer: Medicaid Other

## 2022-12-04 ENCOUNTER — Encounter (HOSPITAL_COMMUNITY)
Admission: RE | Admit: 2022-12-04 | Discharge: 2022-12-04 | Disposition: A | Discharge status: Home / Self Care | Payer: Medicaid Other | Source: Ambulatory Visit | Attending: Cardiology | Admitting: Cardiology

## 2022-12-04 DIAGNOSIS — Z955 Presence of coronary angioplasty implant and graft: Secondary | ICD-10-CM

## 2022-12-06 ENCOUNTER — Encounter (HOSPITAL_COMMUNITY): Payer: Medicaid Other

## 2022-12-08 ENCOUNTER — Encounter (HOSPITAL_COMMUNITY): Payer: Medicaid Other

## 2022-12-08 ENCOUNTER — Encounter (HOSPITAL_COMMUNITY)
Admission: RE | Admit: 2022-12-08 | Discharge: 2022-12-08 | Disposition: A | Discharge status: Home / Self Care | Payer: Medicaid Other | Source: Ambulatory Visit | Attending: Cardiology | Admitting: Cardiology

## 2022-12-08 DIAGNOSIS — Z955 Presence of coronary angioplasty implant and graft: Secondary | ICD-10-CM | POA: Diagnosis not present

## 2022-12-11 ENCOUNTER — Encounter (HOSPITAL_COMMUNITY): Payer: Medicaid Other

## 2022-12-11 ENCOUNTER — Encounter (HOSPITAL_COMMUNITY)
Admission: RE | Admit: 2022-12-11 | Discharge: 2022-12-11 | Disposition: A | Discharge status: Home / Self Care | Payer: Medicaid Other | Source: Ambulatory Visit | Attending: Cardiology | Admitting: Cardiology

## 2022-12-11 VITALS — Ht 61.5 in | Wt 253.5 lb

## 2022-12-11 DIAGNOSIS — Z955 Presence of coronary angioplasty implant and graft: Secondary | ICD-10-CM

## 2022-12-13 ENCOUNTER — Encounter (HOSPITAL_COMMUNITY): Payer: Medicaid Other

## 2022-12-15 ENCOUNTER — Encounter (HOSPITAL_COMMUNITY)
Admission: RE | Admit: 2022-12-15 | Discharge: 2022-12-15 | Disposition: A | Discharge status: Home / Self Care | Payer: Medicaid Other | Source: Ambulatory Visit | Attending: Cardiology | Admitting: Cardiology

## 2022-12-15 ENCOUNTER — Encounter (HOSPITAL_COMMUNITY): Payer: Medicaid Other

## 2022-12-15 DIAGNOSIS — Z955 Presence of coronary angioplasty implant and graft: Secondary | ICD-10-CM

## 2022-12-20 ENCOUNTER — Encounter (HOSPITAL_COMMUNITY): Payer: Medicaid Other

## 2022-12-22 ENCOUNTER — Encounter (HOSPITAL_COMMUNITY): Payer: Medicaid Other

## 2022-12-22 ENCOUNTER — Encounter (HOSPITAL_COMMUNITY)
Admission: RE | Admit: 2022-12-22 | Discharge: 2022-12-22 | Disposition: A | Discharge status: Home / Self Care | Payer: Medicaid Other | Source: Ambulatory Visit | Attending: Cardiology | Admitting: Cardiology

## 2022-12-22 DIAGNOSIS — Z955 Presence of coronary angioplasty implant and graft: Secondary | ICD-10-CM | POA: Diagnosis not present

## 2022-12-22 NOTE — Progress Notes (Signed)
Discharge Progress Report  Patient Details  Name: SARAIH GRIESEMER MRN: [id number] Date of Birth: October 31, 1975 Referring Provider:   Flowsheet Row CARDIAC REHAB PHASE II ORIENTATION from 10/10/2022 in [facility]  Referring Provider Diamond Nickel, MD  Quintella Reichert, MD]        Number of Visits: 33  Reason for Discharge:  Patient reached a stable level of exercise. Patient independent in their exercise. Patient has met program and personal goals.  Smoking History:  Social History   Tobacco Use  Smoking Status Never  Smokeless Tobacco Never    Diagnosis:  06/13/22 DES LAD  ADL UCSD:   Initial Exercise Prescription:  Initial Exercise Prescription - 10/10/22 1300       Date of Initial Exercise RX and Referring Provider   Date 10/10/22    Referring Provider Diamond Nickel, MD   Quintella Reichert, MD   Expected Discharge Date 12/12/22      Treadmill   MPH 1.7    Grade 0    Minutes 15    METs 2.3      NuStep   Level 2    SPM 80    Minutes 15    METs 2.2      Prescription Details   Frequency (times per week) 2    Duration Progress to 30 minutes of continuous aerobic without signs/symptoms of physical distress      Intensity   THRR 40-80% of Max Heartrate 70-141    Ratings of Perceived Exertion 11-13    Perceived Dyspnea 0-4      Progression   Progression Continue to progress workloads to maintain intensity without signs/symptoms of physical distress.      Resistance Training   Training Prescription Yes    Weight 2 lbs    Reps 10-15             Discharge Exercise Prescription (Final Exercise Prescription Changes):  Exercise Prescription Changes - 12/22/22 1645       Response to Exercise   Blood Pressure (Admit) 134/78    Blood Pressure (Exercise) 124/70    Blood Pressure (Exit) 122/80    Heart Rate (Admit) 65 bpm    Heart Rate (Exercise) 102 bpm    Heart Rate (Exit) 71 bpm     Rating of Perceived Exertion (Exercise) 11    Perceived Dyspnea (Exercise) 0    Symptoms 0    Comments Pt graduated the CRP2 ICR program    Duration Progress to 30 minutes of  aerobic without signs/symptoms of physical distress    Intensity THRR unchanged      Progression   Progression Continue to progress workloads to maintain intensity without signs/symptoms of physical distress.    Average METs 2.69      Resistance Training   Training Prescription Yes    Weight 3 lbs wts    Reps 10-15    Time 10 Minutes      Treadmill   MPH 2.2    Grade 0    Minutes 30    METs 2.69      Home Exercise Plan   Plans to continue exercise at Home (comment)    Frequency Add 4 additional days to program exercise sessions.    Initial Home Exercises Provided 10/30/22             Functional Capacity:  6 Minute Walk     Row Name 10/10/22 1119 12/11/22  1659       6 Minute Walk   Phase Initial Discharge    Distance 1197 feet 1445 feet    Distance % Change -- 20.72 %    Distance Feet Change -- 248 ft    Walk Time 6 minutes 6 minutes    # of Rest Breaks 0 0    MPH 2.27 2.74    METS 3.03 3.69    RPE 10 13    Perceived Dyspnea  1 0    VO2 Peak 10.59 12.92    Symptoms Yes (comment) Yes (comment)    Comments Mild SOB, RPD=1 chronic low back pain 5/10, left calf pain 4/10 resolved with rest    Resting HR 69 bpm 75 bpm    Resting BP 128/90 130/70    Resting Oxygen Saturation  99 % --    Exercise Oxygen Saturation  during 6 min walk 98 % --    Max Ex. HR 86 bpm 113 bpm    Max Ex. BP 138/84 138/80    2 Minute Post BP 122/88 --             Psychological, QOL, Others - Outcomes: PHQ 2/9:    12/22/2022    3:56 PM 11/09/2022    4:18 PM 10/12/2022    3:03 PM 10/10/2022   12:06 PM 09/04/2022    3:29 PM  Depression screen PHQ 2/9  Decreased Interest 0 1 2 2  0  Down, Depressed, Hopeless 0 1 2 2  0  PHQ - 2 Score 0 2 4 4  0  Altered sleeping 1 1 3 2 3   Tired, decreased energy 0 1 3 2 1    Change in appetite 0 0 0 0 0  Feeling bad or failure about yourself  0 1 1 1  0  Trouble concentrating 1 1 3 1  0  Moving slowly or fidgety/restless 0 0 0 0 0  Suicidal thoughts 0 0 0 0 0  PHQ-9 Score 2 6 14 10 4   Difficult doing work/chores Not difficult at all Somewhat difficult Somewhat difficult Somewhat difficult Not difficult at all    Quality of Life:  Quality of Life - 10/10/22 1218       Quality of Life   Select Quality of Life      Quality of Life Scores   Health/Function Pre 19.17 %    Socioeconomic Pre 23.86 %    Psych/Spiritual Pre 14.64 %    Family Pre 18 %    GLOBAL Pre 19.03 %             Personal Goals: Goals established at orientation with interventions provided to work toward goal.  Personal Goals and Risk Factors at Admission - 10/10/22 1105       Core Components/Risk Factors/Patient Goals on Admission    Weight Management Yes;Obesity;Weight Loss    Intervention Weight Management/Obesity: Establish reasonable short term and long term weight goals.;Obesity: Provide education and appropriate resources to help participant work on and attain dietary goals.    Admit Weight 248 lb 10.9 oz (112.8 kg)    Expected Outcomes Short Term: Continue to assess and modify interventions until short term weight is achieved;Weight Loss: Understanding of general recommendations for a balanced deficit meal plan, which promotes 1-2 lb weight loss per week and includes a negative energy balance of [phone] kcal/d    Hypertension Yes    Intervention Provide education on lifestyle modifcations including regular physical activity/exercise, weight management, moderate sodium restriction and increased consumption  of fresh fruit, vegetables, and low fat dairy, alcohol moderation, and smoking cessation.;Monitor prescription use compliance.    Expected Outcomes Short Term: Continued assessment and intervention until BP is < 140/10mm HG in hypertensive participants. < 130/27mm HG in  hypertensive participants with diabetes, heart failure or chronic kidney disease.;Long Term: Maintenance of blood pressure at goal levels.    Lipids Yes    Intervention Provide education and support for participant on nutrition & aerobic/resistive exercise along with prescribed medications to achieve LDL 70mg , HDL >40mg .    Expected Outcomes Short Term: Participant states understanding of desired cholesterol values and is compliant with medications prescribed. Participant is following exercise prescription and nutrition guidelines.;Long Term: Cholesterol controlled with medications as prescribed, with individualized exercise RX and with personalized nutrition plan. Value goals: LDL < 70mg , HDL > 40 mg.    Stress Yes    Intervention Offer individual and/or small group education and counseling on adjustment to heart disease, stress management and health-related lifestyle change. Teach and support self-help strategies.;Refer participants experiencing significant psychosocial distress to appropriate mental health specialists for further evaluation and treatment. When possible, include family members and significant others in education/counseling sessions.    Expected Outcomes Short Term: Participant demonstrates changes in health-related behavior, relaxation and other stress management skills, ability to obtain effective social support, and compliance with psychotropic medications if prescribed.;Long Term: Emotional wellbeing is indicated by absence of clinically significant psychosocial distress or social isolation.    Personal Goal Other Yes    Personal Goal Learn to eat better for better health. Be more active.    Intervention Patient will attend nutrition and exercise workshops/ video education. Provide individualized nutrition and exercise plan that patient can follow.    Expected Outcomes Patient will be compliant with nutrtion and exercise plan.              Personal Goals Discharge:  Goals  and Risk Factor Review     Row Name 10/17/22 0848 11/03/22 1721 11/29/22 1828 01/04/23 0841       Core Components/Risk Factors/Patient Goals Review   Personal Goals Review Weight Management/Obesity;Hypertension;Stress Weight Management/Obesity;Hypertension;Stress Weight Management/Obesity;Hypertension;Stress Weight Management/Obesity;Hypertension;Stress    Review Shakea started intensive cardiac rehab on 10/16/22 and did well with exercise. Vital signs were stable Omayra is doing  well with exercise at intensive cardiac rehab.. Vital signs have been stable. Myra is enjoying participating in the program Conita is doing  well with exercise at intensive cardiac rehab.. Vital signs have been stable. Myra is enjoying participating in the program. Lua did with exercise at intensive cardiac rehab.. Vital signs were stable. Felicie completed intensive cardiac rehab on 12/22/22    Expected Outcomes Aneli will continue to participate in intensive cardiac rehab for exercise, nutrition and lifestyle modifications Alfhild will continue to participate in intensive cardiac rehab for exercise, nutrition and lifestyle modifications Jaysie will continue to participate in intensive cardiac rehab for exercise, nutrition and lifestyle modifications Tieisha will continue to  exercise, follow  nutrition and lifestyle modifications upon completion of intensive cardiac rehab             Exercise Goals and Review:  Exercise Goals     Row Name 10/10/22 1105             Exercise Goals   Increase Physical Activity Yes       Intervention Provide advice, education, support and counseling about physical activity/exercise needs.;Develop an individualized exercise prescription for aerobic and resistive training based on initial evaluation findings, risk  stratification, comorbidities and participant's personal goals.       Expected Outcomes Short Term: Attend rehab on a regular basis to increase amount of physical activity.;Long  Term: Add in home exercise to make exercise part of routine and to increase amount of physical activity.;Long Term: Exercising regularly at least 3-5 days a week.       Increase Strength and Stamina Yes       Intervention Provide advice, education, support and counseling about physical activity/exercise needs.;Develop an individualized exercise prescription for aerobic and resistive training based on initial evaluation findings, risk stratification, comorbidities and participant's personal goals.       Expected Outcomes Short Term: Increase workloads from initial exercise prescription for resistance, speed, and METs.;Short Term: Perform resistance training exercises routinely during rehab and add in resistance training at home;Long Term: Improve cardiorespiratory fitness, muscular endurance and strength as measured by increased METs and functional capacity ( )       Able to understand and use rate of perceived exertion (RPE) scale Yes       Intervention Provide education and explanation on how to use RPE scale       Expected Outcomes Short Term: Able to use RPE daily in rehab to express subjective intensity level;Long Term:  Able to use RPE to guide intensity level when exercising independently       Knowledge and understanding of Target Heart Rate Range (THRR) Yes       Intervention Provide education and explanation of THRR including how the numbers were predicted and where they are located for reference       Expected Outcomes Short Term: Able to state/look up THRR;Long Term: Able to use THRR to govern intensity when exercising independently;Short Term: Able to use daily as guideline for intensity in rehab       Able to check pulse independently Yes       Intervention Provide education and demonstration on how to check pulse in carotid and radial arteries.;Review the importance of being able to check your own pulse for safety during independent exercise       Expected Outcomes Short Term: Able to  explain why pulse checking is important during independent exercise;Long Term: Able to check pulse independently and accurately       Understanding of Exercise Prescription Yes       Intervention Provide education, explanation, and written materials on patient's individual exercise prescription       Expected Outcomes Short Term: Able to explain program exercise prescription;Long Term: Able to explain home exercise prescription to exercise independently                Exercise Goals Re-Evaluation:  Exercise Goals Re-Evaluation     Row Name 10/16/22 1635 10/30/22 1709 11/17/22 1540 11/27/22 1710 12/11/22 1656     Exercise Goal Re-Evaluation   Exercise Goals Review Increase Physical Activity;Understanding of Exercise Prescription;Increase Strength and Stamina;Knowledge and understanding of Target Heart Rate Range (THRR);Able to understand and use rate of perceived exertion (RPE) scale Increase Physical Activity;Understanding of Exercise Prescription;Increase Strength and Stamina;Knowledge and understanding of Target Heart Rate Range (THRR);Able to understand and use rate of perceived exertion (RPE) scale Increase Physical Activity;Understanding of Exercise Prescription;Increase Strength and Stamina;Knowledge and understanding of Target Heart Rate Range (THRR);Able to understand and use rate of perceived exertion (RPE) scale Increase Physical Activity;Understanding of Exercise Prescription;Increase Strength and Stamina;Knowledge and understanding of Target Heart Rate Range (THRR);Able to understand and use rate of perceived exertion (RPE) scale Increase Physical  Activity;Understanding of Exercise Prescription;Increase Strength and Stamina;Knowledge and understanding of Target Heart Rate Range (THRR);Able to understand and use rate of perceived exertion (RPE) scale   Comments Pt first day in the CRP2 program. Pt tolerated exercise well with an average MET level of 2.3. Pt is learning her THRR, RPE and  ExRx Reviewed MET's, goals and homeExRx. Pt tolerated exercise well with an average MET level of 2.1. Pt feels good about her goals of being more active, and she has been working on eating healthier and adding in more veggies. Overall pt feels good about her progress and about adopting a healthier lifestyle. Pt has already been doing PT exercises at home daily for about 15 mins, but encourage adding in more time by trying some yourtube chair fitness classes or light handweights on some of the days when shes not here so that she get in 5-7 days of 30 mins of physical activity Patient has been having ongoing back pain, possibly nerve issue, therefore unable to do the NuStep. Switched to TM 30 minutes. Per patient, she's having a hard time reaching overhead in cabinets. No overhead weights or stretching at cardiac rehab. Will increase TM to 2.0/0  at next session. Her goal is to be more active, which is a challenge at home because she's tired after work. Coming here helps increase her activity level. Reviewed METs and goals. Pt tolerated exercise well with an average MET level of 3.42. Pt feels good with her goals of living better and eating well. She does have some back issues that cause her pain and limits some activities, but overall she feels good with her goals and has all the resources she needs Reviewed METs and post walk test. Pt tolerated exercise well with an average MET level of 3.19.Pt is feeling good and increased her speed on the TM today. Post walk test results showed 248 more feet than prior with a 20.72 % increase. Pt also increase flexibility and grip strength   Expected Outcomes Will continue to monitor pt and progress workloads as tolerated without sign or symptom Will continue to monitor pt and progress workloads as tolerated without sign or symptom Increase workload on TM at next session. Continue to monitor back pain, and adjust exercise as needed. Will continue to monitor pt and progress  workloads as tolerated without sign or symptom Will continue to monitor pt and progress workloads as tolerated without sign or symptom    Row Name 12/27/22 0919             Exercise Goal Re-Evaluation   Exercise Goals Review Increase Physical Activity;Understanding of Exercise Prescription;Increase Strength and Stamina;Knowledge and understanding of Target Heart Rate Range (THRR);Able to understand and use rate of perceived exertion (RPE) scale       Comments Pt graduated the CRP2 Pritikin Program. Pt tolerated exercise well with an average MET level of 2.69. Pt will continue to exercise by walking and doing her PT exercises, she has an upcoming surgery so referred her to their guidence post op.       Expected Outcomes Pt will continue to exercise on her own and gain strength                Nutrition & Weight - Outcomes:  Pre Biometrics - 10/10/22 0931       Pre Biometrics   Waist Circumference 52 inches    Hip Circumference 59.5 inches    Waist to Hip Ratio 0.87 %    Triceps Skinfold  45 mm    % Body Fat 56.8 %    Grip Strength 28 kg    Flexibility 10.5 in    Single Leg Stand 30 seconds             Post Biometrics - 12/11/22 1700        Post  Biometrics   Height 5' 1.5" (1.562 m)    Weight 115 kg    Waist Circumference 48.75 inches    Hip Circumference 59 inches    Waist to Hip Ratio 0.83 %    BMI (Calculated) 47.13    Triceps Skinfold 45 mm    % Body Fat 56.1 %    Grip Strength 38 kg    Flexibility 13 in    Single Leg Stand 30 seconds             Nutrition:  Nutrition Therapy & Goals - 12/11/22 0917       Nutrition Therapy   Diet Heart healthy diet    Drug/Food Interactions Statins/Certain Fruits      Personal Nutrition Goals   Nutrition Goal Patient to identify strategies for reducing cardiovascular risk by attending the Pritikin education and nutrition series weekly.    Personal Goal #2 Patient to improve diet quality by using the plate method  as a guide for meal planning to include lean protein/plant protein, fruits, vegetables, whole grains, nonfat dairy as part of a well-balanced diet.    Personal Goal #3 Patient to identify strategies for weight loss of 0.5-2.0# per week.    Personal Goal #4 Patient to limit sodium to 1500mg  per day    Comments Goals in action. Teal continues to attend the Pritikin education and nutrition series. She has started making many dietary changes including eliminated sugary drinks, decreased sweets/refined carbohydrates, and increased vegetables. She is getting her family involved in her lifestyle changes. Nikeria has been working with [facility] bariat[facility] in preparation for future bariatric surgery ([facility]); surgery is scheduled for 12/26/2022. She has not met weight loss goals since starting with our program. She will continue to benefit from participation in intensive cardiac rehab for nutrition, exercise, and lifestyle modification.      Intervention Plan   Intervention Prescribe, educate and counsel regarding individualized specific dietary modifications aiming towards targeted core components such as weight, hypertension, lipid management, diabetes, heart failure and other comorbidities.;Nutrition handout(s) given to patient.    Expected Outcomes Short Term Goal: Understand basic principles of dietary content, such as calories, fat, sodium, cholesterol and nutrients.;Long Term Goal: Adherence to prescribed nutrition plan.;Short Term Goal: A plan has been developed with personal nutrition goals set during dietitian appointment.             Nutrition Discharge:  Nutrition Assessments - 10/11/22 1112       Rate Your Plate Scores   Pre Score 60             Education Questionnaire Score:  Knowledge Questionnaire Score - 10/10/22 1222       Knowledge Questionnaire Score   Pre Score 21/24             Goals reviewed with patient; copy given to patient.Pt graduates from   Intensive/Traditional cardiac rehab program on 12/22/22  with completion of  33 exercise and education sessions. Pt maintained good attendance and progressed nicely during their participation in rehab as evidenced by increased MET level. Nyesha increased her distance on her post exercise walk test by 248 feet.  Medication list  reconciled. Repeat  PHQ score- 2 .  Pt has made significant lifestyle changes and should be commended for their success. Jarely  achieved their goals during cardiac rehab.   Pt plans to continue exercise at  home as directed by her medical team as Nattaly was scheduled for upcoming abdominal surgery. We are proud of Threasa's progress! Thayer Headings RN BSN

## 2022-12-29 ENCOUNTER — Telehealth: Payer: Self-pay

## 2022-12-29 ENCOUNTER — Other Ambulatory Visit: Payer: Self-pay | Admitting: Nurse Practitioner

## 2022-12-29 MED ORDER — AMLODIPINE BESYLATE 10 MG PO TABS: 10.0000 mg | ORAL_TABLET | Freq: Every day | ORAL | 0 refills | Status: DC

## 2023-01-01 ENCOUNTER — Other Ambulatory Visit: Payer: Self-pay

## 2023-01-01 ENCOUNTER — Emergency Department (HOSPITAL_COMMUNITY): Payer: Medicaid Other

## 2023-01-01 ENCOUNTER — Emergency Department (HOSPITAL_COMMUNITY)
Admission: EM | Admit: 2023-01-01 | Discharge: 2023-01-01 | Disposition: A | Discharge status: Other Acute Inpatient Hospital | Payer: Medicaid Other | Attending: Emergency Medicine | Admitting: Emergency Medicine

## 2023-01-01 ENCOUNTER — Encounter (HOSPITAL_COMMUNITY): Payer: Self-pay | Admitting: Emergency Medicine

## 2023-01-01 DIAGNOSIS — E876 Hypokalemia: Secondary | ICD-10-CM | POA: Diagnosis not present

## 2023-01-01 DIAGNOSIS — Z86018 Personal history of other benign neoplasm: Secondary | ICD-10-CM | POA: Insufficient documentation

## 2023-01-01 DIAGNOSIS — D72829 Elevated white blood cell count, unspecified: Secondary | ICD-10-CM | POA: Diagnosis not present

## 2023-01-01 DIAGNOSIS — I2510 Atherosclerotic heart disease of native coronary artery without angina pectoris: Secondary | ICD-10-CM | POA: Diagnosis not present

## 2023-01-01 DIAGNOSIS — K922 Gastrointestinal hemorrhage, unspecified: Secondary | ICD-10-CM | POA: Insufficient documentation

## 2023-01-01 DIAGNOSIS — I10 Essential (primary) hypertension: Secondary | ICD-10-CM | POA: Diagnosis not present

## 2023-01-01 DIAGNOSIS — K625 Hemorrhage of anus and rectum: Secondary | ICD-10-CM | POA: Diagnosis present

## 2023-01-01 DIAGNOSIS — Z8543 Personal history of malignant neoplasm of ovary: Secondary | ICD-10-CM | POA: Insufficient documentation

## 2023-01-01 DIAGNOSIS — D62 Acute posthemorrhagic anemia: Secondary | ICD-10-CM | POA: Diagnosis not present

## 2023-01-01 DIAGNOSIS — Z79899 Other long term (current) drug therapy: Secondary | ICD-10-CM | POA: Insufficient documentation

## 2023-01-01 LAB — PREPARE PLATELET PHERESIS

## 2023-01-01 LAB — COMPREHENSIVE METABOLIC PANEL
ALT: 28 U/L (ref 0–44)
AST: 23 U/L (ref 15–41)
Albumin: 3.2 g/dL — ABNORMAL LOW (ref 3.5–5.0)
Alkaline Phosphatase: 52 U/L (ref 38–126)
Anion gap: 12 (ref 5–15)
BUN: 24 mg/dL — ABNORMAL HIGH (ref 6–20)
CO2: 20 mmol/L — ABNORMAL LOW (ref 22–32)
Calcium: 8.1 mg/dL — ABNORMAL LOW (ref 8.9–10.3)
Chloride: 106 mmol/L (ref 98–111)
Creatinine, Ser: 0.81 mg/dL (ref 0.44–1.00)
GFR, Estimated: >60 mL/min (ref >60)
Glucose, Bld: 119 mg/dL — ABNORMAL HIGH (ref 70–99)
Potassium: 3.4 mmol/L — ABNORMAL LOW (ref 3.5–5.1)
Sodium: 138 mmol/L (ref 135–145)
Total Bilirubin: 0.7 mg/dL (ref 0.3–1.2)
Total Protein: 6.3 g/dL — ABNORMAL LOW (ref 6.5–8.1)

## 2023-01-01 LAB — CBC WITH DIFFERENTIAL/PLATELET
Abs Immature Granulocytes: 0.09 10*3/uL — ABNORMAL HIGH (ref 0.00–0.07)
Basophils Absolute: 0 10*3/uL (ref 0.0–0.1)
Basophils Relative: 0 %
Eosinophils Absolute: 0.2 10*3/uL (ref 0.0–0.5)
Eosinophils Relative: 2 %
HCT: 29.9 % — ABNORMAL LOW (ref 36.0–46.0)
Hemoglobin: 9.6 g/dL — ABNORMAL LOW (ref 12.0–15.0)
Immature Granulocytes: 1 %
Lymphocytes Relative: 13 %
Lymphs Abs: 1.4 10*3/uL (ref 0.7–4.0)
MCH: 26.6 pg (ref 26.0–34.0)
MCHC: 32.1 g/dL (ref 30.0–36.0)
MCV: 82.8 fL (ref 80.0–100.0)
Monocytes Absolute: 1 10*3/uL (ref 0.1–1.0)
Monocytes Relative: 9 %
Neutro Abs: 8.4 10*3/uL — ABNORMAL HIGH (ref 1.7–7.7)
Neutrophils Relative %: 75 %
Platelets: 283 10*3/uL (ref 150–400)
RBC: 3.61 MIL/uL — ABNORMAL LOW (ref 3.87–5.11)
RDW: 13.9 % (ref 11.5–15.5)
WBC: 11.3 10*3/uL — ABNORMAL HIGH (ref 4.0–10.5)
nRBC: 0 % (ref 0.0–0.2)

## 2023-01-01 LAB — PREPARE RBC (CROSSMATCH)

## 2023-01-01 LAB — ABO/RH: ABO/RH(D): O POS

## 2023-01-01 LAB — BPAM PLATELET PHERESIS: Unit Type and Rh: 7300

## 2023-01-01 LAB — BPAM RBC
Blood Product Expiration Date: 202407122359
ISSUE DATE / TIME: 202406100815

## 2023-01-01 LAB — LIPASE, BLOOD: Lipase: 50 U/L (ref 11–51)

## 2023-01-01 LAB — POC OCCULT BLOOD, ED: Fecal Occult Bld: POSITIVE — AB

## 2023-01-01 MED ORDER — IOHEXOL 300 MG/ML  SOLN
100.0000 mL | Freq: Once | INTRAMUSCULAR | Status: AC | PRN
  Administered 2023-01-01: 100 mL via INTRAVENOUS

## 2023-01-01 MED ORDER — ONDANSETRON HCL 4 MG/2ML IJ SOLN
4.0000 mg | Freq: Once | INTRAMUSCULAR | Status: AC
  Administered 2023-01-01: 4 mg via INTRAVENOUS
  Filled 2023-01-01: qty 2

## 2023-01-01 MED ORDER — SODIUM CHLORIDE 0.9% IV SOLUTION: Freq: Once | INTRAVENOUS | Status: AC

## 2023-01-01 MED ORDER — SODIUM CHLORIDE 0.9 % IV BOLUS (SEPSIS)
1000.0000 mL | Freq: Once | INTRAVENOUS | Status: AC
  Administered 2023-01-01: 1000 mL via INTRAVENOUS

## 2023-01-01 MED ORDER — SODIUM CHLORIDE 0.9 % IV SOLN
1000.0000 mL | INTRAVENOUS | Status: DC
  Administered 2023-01-01: 1000 mL via INTRAVENOUS

## 2023-01-01 NOTE — ED Notes (Signed)
Pt given NS en route

## 2023-01-01 NOTE — ED Notes (Signed)
Took patient off bedpan. Large amount of black stool with clots. Cleaned patient up. Tolerated well. Ready for CT scan.

## 2023-01-01 NOTE — ED Provider Notes (Signed)
7:37 AM Care assumed from Dr. Eudelia Bunch.  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 Lowell Guitar.  Patient not having significant abdominal pain at this time but blood transfusion was ordered when blood pressure had dipped into the mid 80s and even low 70's systolic and she was symptomatic with bright rectal bleeding.  After CT scan, will call [facility] to discuss disposition.  8:55 AM CT scan just returned showing postsurgical changes and either gastritis or just postsurgical perigastric edema, nondilated colon also shows high density fluid consistent with blood with rectal bleeding, there is some likely postoperative ileus, and some trace peritoneal free air likely postsurgical in appearance.  Will call the bariatric/MIS team at [facility] to discuss disposition.    9:42 AM Spoke to Dr. Lowell Guitar with the MIS team who did surgery on her and then I also spoke to Dr. Barnetta Chapel who accept the person for transfer to a direct bed at [facility] for further monitoring of her bleeding.  He felt that she needs to hold the Plavix and get a unit of platelets as well as the blood here.  They will transfer and admit for further management.  Will await the bed to be ready and we will transfer.  Patient otherwise resting and blood pressure has improved over 100 now.   CRITICAL CARE Performed by: Canary Brim Thamara Leger Total critical care time: 30 minutes Critical care time was exclusive of separately billable procedures and treating other patients. Critical care was necessary to treat or prevent imminent or life-threatening deterioration. Critical care was time spent personally by me on the following activities: development of treatment plan with patient and/or surrogate as well as nursing, discussions with consultants,  evaluation of patient's response to treatment, examination of patient, obtaining history from patient or surrogate, ordering and performing treatments and interventions, ordering and review of laboratory studies, ordering and review of radiographic studies, pulse oximetry and re-evaluation of patient's condition.   Clinical Impression: 1. Acute GI bleeding   2. ABLA (acute blood loss anemia)     Disposition: Admit and transfer to [facility] health care being accepted by Dr. Barnetta Chapel with general surgery.  This note was prepared with assistance of Conservation officer, historic buildings. Occasional wrong-word or sound-a-like substitutions may have occurred due to the inherent limitations of voice recognition software.      Nicci Vaughan, Canary Brim, MD 01/01/23 1155

## 2023-01-01 NOTE — ED Notes (Signed)
[facility] called. 

## 2023-01-01 NOTE — ED Triage Notes (Addendum)
Pt BIB [facility] from home for bright red bloody stools x 2 episodes, dizziness, and hot flash; pt had gastric bypass x 6 days ago, v/s en route 139/74, HR 68, NSR, O2 97% RA, RR 16, hx of cardiac stent in Feb

## 2023-01-01 NOTE — ED Provider Notes (Signed)
[place] EMERGENCY DEPARTMENT AT [facility] Provider Note  CSN: [id number] Arrival date & time: 01/01/23 0505  Chief Complaint(s) Rectal Bleeding  HPI [name] is a 47 y.o. female with a past medical history listed below including recent Roux-en-Y in June 4 who presents to the emergency department with 2 bouts of bright red blood bowel movements that began just prior to arrival.  After the initial episode, the patient was feeling fine and decided to go back to bed.  After the second bowel movement, the patient became clammy, diaphoretic.  She became orthostatic as well.  This prompted her to call EMS for evaluation.  Patient denies any abdominal pain.  She does have some discomfort attributed to her recent surgery.  She reports that she was doing well up until this evening.  States that she is been staying on her liquid diet.  She endorses nausea without emesis.  She is not on any blood thinners but does take Plavix.  The history is provided by the patient.    Past Medical History Past Medical History:  Diagnosis Date   Anxiety    Cancer [facility])    ovarian cancer   Carpal tunnel syndrome of right wrist 11/21/2013   Coronary artery disease    Depression    Ganglion cyst of wrist 11/21/2013   right   Hypertension    is on 3 meds. - states BP is under control; has been on med. x 12 yr.   Migraines    Right tennis elbow 11/21/2013   Patient Active Problem List   Diagnosis Date Noted   Class 3 severe obesity due to excess calories with body mass index (BMI) of 45.0 to 49.9 in adult, unspecified whether serious comorbidity present (HCC) 09/04/2022   Chronic neck pain 09/04/2022   Coronary artery disease involving native coronary artery of native heart without angina pectoris 06/27/2022   Abnormal cardiac CT angiography 06/08/2022   Carpal tunnel syndrome on left 11/28/2021   Numbness and tingling in left hand 10/11/2021   Abdominal cramping 09/26/2021   Hormone  replacement therapy 09/26/2021   Pelvic mass 04/11/2021   Teratoma, malignant (HCC) 09/21/2020   Premature atrial contractions 06/10/2020   PVC (premature ventricular contraction) 06/10/2020   Acanthosis nigricans, acquired 06/14/2018   Prediabetes 06/14/2018   GAD (generalized anxiety disorder) 08/30/2017   Hypoestrogenism 08/30/2017   Migraine without aura 08/10/2015   GERD without esophagitis 08/10/2015   Obesity due to excess calories with serious comorbidity 08/10/2015   Primary insomnia 08/10/2015   Spinal stenosis, lumbar 01/14/2015   Carpal tunnel syndrome 12/12/2013   HYPERLIPIDEMIA 12/05/2007   Essential hypertension 12/05/2007   BRADYCARDIA 12/05/2007   DEGENERATIVE JOINT DISEASE 12/05/2007   Home Medication(s) Prior to Admission medications   Medication Sig Start Date End Date Taking? Authorizing Provider  amLODipine (NORVASC) 10 MG tablet Take 1 tablet (10 mg total) by mouth daily. 12/29/22   Arnette Felts, FNP  atorvastatin (LIPITOR) 40 MG tablet Take 40 mg by mouth daily.    [provider]  buPROPion (WELLBUTRIN XL) 150 MG 24 hr tablet Take 1 tablet (150 mg total) by mouth daily. 11/09/22   Arnette Felts, FNP  clopidogrel (PLAVIX) 75 MG tablet Take 1 tablet by mouth daily. Patient not taking: Reported on 12/22/2022 06/14/22   [provider]  diclofenac Sodium (VOLTAREN) 1 % GEL Apply 2 g topically 4 (four) times daily. 11/09/22   Arnette Felts, FNP  estradiol (ESTRACE) 2 MG tablet Take 1  tablet (2 mg total) by mouth daily. 08/02/22   Arnette Felts, FNP  hydrochlorothiazide (HYDRODIURIL) 25 MG tablet Take 1 tablet (25 mg total) by mouth every morning. 04/10/22   Arnette Felts, FNP  losartan (COZAAR) 100 MG tablet Take 1 tablet (100 mg total) by mouth daily. 08/02/22   Arnette Felts, FNP  Magnesium Gluconate 250 MG TABS Take 1 tablet (250 mg total) by mouth every evening. Take with evening meal 09/04/22   Arnette Felts, FNP  methocarbamol (ROBAXIN-750) 750 MG  tablet Take 1 tablet (750 mg total) by mouth 2 (two) times daily as needed for muscle spasms. 11/09/22   Arnette Felts, FNP  pantoprazole (PROTONIX) 40 MG tablet Take 1 tablet (40 mg total) by mouth daily. 11/09/22 11/09/23  Arnette Felts, FNP  potassium chloride SA (KLOR-CON M) 20 MEQ tablet Take 20 mEq by mouth 2 (two) times daily. Patient not taking: Reported on 10/12/2022    [provider]  Vitamin D, Ergocalciferol, (DRISDOL) 1.25 MG (50000 UNIT) CAPS capsule Take 1 capsule (50,000 Units total) by mouth every 7 (seven) days. Patient not taking: Reported on 12/22/2022 09/05/22   Arnette Felts, FNP                                                                                                                                    Allergies Venlafaxine  Review of Systems Review of Systems As noted in HPI  Physical Exam Vital Signs  I have reviewed the triage vital signs BP 119/70   Pulse 69   Temp 98.3 F (36.8 C) (Oral)   Resp (!) 23   Ht 5' 1.5" (1.562 m)   Wt 108 kg   LMP 09/17/2010   SpO2 100%   BMI 44.24 kg/m   Physical Exam Vitals reviewed.  Constitutional:      General: She is not in acute distress.    Appearance: She is well-developed. She is obese. She is not diaphoretic.  HENT:     Head: Normocephalic and atraumatic.     Right Ear: External ear normal.     Left Ear: External ear normal.     Nose: Nose normal.  Eyes:     General: No scleral icterus.    Conjunctiva/sclera: Conjunctivae normal.  Neck:     Trachea: Phonation normal.  Cardiovascular:     Rate and Rhythm: Normal rate and regular rhythm.  Pulmonary:     Effort: Pulmonary effort is normal. No respiratory distress.     Breath sounds: No stridor.  Abdominal:     General: There is no distension.     Tenderness: There is abdominal tenderness (mild discomfort) in the right upper quadrant.     Comments: Trochar incisions C/D/I w/o erythema  Musculoskeletal:        General: Normal range of motion.      Cervical back: Normal range of motion.  Neurological:     Mental Status: She  is alert and oriented to person, place, and time.  Psychiatric:        Behavior: Behavior normal.     ED Results and Treatments Labs (all labs ordered are listed, but only abnormal results are displayed) Labs Reviewed  CBC WITH DIFFERENTIAL/PLATELET - Abnormal; Notable for the following components:      Result Value   WBC 11.3 (*)    RBC 3.61 (*)    Hemoglobin 9.6 (*)    HCT 29.9 (*)    Neutro Abs 8.4 (*)    Abs Immature Granulocytes 0.09 (*)    All other components within normal limits  COMPREHENSIVE METABOLIC PANEL - Abnormal; Notable for the following components:   Potassium 3.4 (*)    CO2 20 (*)    Glucose, Bld 119 (*)    BUN 24 (*)    Calcium 8.1 (*)    Total Protein 6.3 (*)    Albumin 3.2 (*)    All other components within normal limits  POC OCCULT BLOOD, ED - Abnormal; Notable for the following components:   Fecal Occult Bld POSITIVE (*)    All other components within normal limits  LIPASE, BLOOD  URINALYSIS, W/ REFLEX TO CULTURE (INFECTION SUSPECTED)  TYPE AND SCREEN  PREPARE RBC (CROSSMATCH)  ABO/RH                                                                                                                         EKG  EKG Interpretation  Date/Time:  Monday January 01 2023 05:16:45 EDT Ventricular Rate:  68 PR Interval:  152 QRS Duration: 95 QT Interval:  434 QTC Calculation: 462 R Axis:   8 Text Interpretation: Sinus rhythm Confirmed by Drema Pry ([id number]) on 01/01/2023 6:07:55 AM       Radiology No results found.  Medications Ordered in ED Medications  sodium chloride 0.9 % bolus 1,000 mL (0 mLs Intravenous Stopped 01/01/23 0628)    Followed by  0.9 %  sodium chloride infusion (1,000 mLs Intravenous New Bag/Given 01/01/23 0558)  0.9 %  sodium chloride infusion (Manually program via Guardrails IV Fluids) (has no administration in time range)  ondansetron (ZOFRAN)  injection 4 mg (has no administration in time range)  ondansetron (ZOFRAN) injection 4 mg (4 mg Intravenous Given 01/01/23 0559)   Procedures .Critical Care  Performed by: Nira Conn, MD Authorized by: Nira Conn, MD   Critical care provider statement:    Critical care time (minutes):  45   Critical care time was exclusive of:  Separately billable procedures and treating other patients   Critical care was necessary to treat or prevent imminent or life-threatening deterioration of the following conditions:  Circulatory failure   Critical care was time spent personally by me on the following activities:  Development of treatment plan with patient or surrogate, discussions with consultants, evaluation of patient's response to treatment, examination of patient, obtaining history from patient or surrogate, review of old charts, re-evaluation of patient's  condition, pulse oximetry, ordering and review of radiographic studies, ordering and review of laboratory studies and ordering and performing treatments and interventions   (including critical care time) Medical Decision Making / ED Course   Medical Decision Making Amount and/or Complexity of Data Reviewed Labs: ordered. Decision-making details documented in ED Course. Radiology: ordered. ECG/medicine tests: ordered and independent interpretation performed. Decision-making details documented in ED Course.  Risk Prescription drug management. Decision regarding hospitalization.    Patient presents for bloody bowel movements. She has soft blood pressures with systolics in the 90s.  Abdomen with mild discomfort in the right upper quadrant.  No evidence of peritonitis. I reviewed the patient's previous records and the CT scan from last year did not show any evidence of diverticulosis.  Will need to assess for any complication from recent surgery will obtain CT scan.  Will also check CBC to assess for severity of anemia  and blood loss.  Will check for electrolyte metabolic derangements, renal sufficiency.  Clinical Course as of 01/01/23 0744  Mon Jan 01, 2023  1610 CBC with mild leukocytosis.  Hemoglobin of 9.6.  3 g drop from 4 days ago.  Gross bright red blood per rectum.  CMP with mild hypokalemia.  No other significant electrolyte derangements or renal insufficiency.  Emergent blood transfusion ordered. [PC]  [id number] CT pending.  Patient care turned over to oncoming provider. Patient case and results discussed in detail; please see their note for further ED managment.   [PC]    Clinical Course User Index [PC] Damani Rando, Amadeo Garnet, MD    Final Clinical Impression(s) / ED Diagnoses Final diagnoses:  Acute GI bleeding  ABLA (acute blood loss anemia)    This chart was dictated using voice recognition software.  Despite best efforts to proofread,  errors can occur which can change the documentation meaning.    Nira Conn, MD 01/01/23 [phone]

## 2023-01-01 NOTE — ED Notes (Signed)
Assumed care of patient. Patient resting comfortably in bed with no signs of acute distress noted. Waiting on blood to be ready for transfusion, CT scan and admission or transfer orders. Patient aware of status.

## 2023-01-01 NOTE — ED Notes (Signed)
Nurse called stated that patient has been assigned to the [facility] room 4  and to call report to [phone]

## 2023-01-02 LAB — BPAM RBC
Blood Product Expiration Date: 202407122359
ISSUE DATE / TIME: 202406100812
Unit Type and Rh: 5100
Unit Type and Rh: 5100

## 2023-01-02 LAB — TYPE AND SCREEN
ABO/RH(D): O POS
Antibody Screen: NEGATIVE
Unit division: 0
Unit division: 0

## 2023-01-02 LAB — BPAM PLATELET PHERESIS
Blood Product Expiration Date: 202406102359
ISSUE DATE / TIME: 202406101023

## 2023-01-02 LAB — PREPARE PLATELET PHERESIS: Unit division: 0

## 2023-01-02 NOTE — Transitions of Care (Post Inpatient/ED Visit) (Signed)
   01/03/2023  Name: [name] MRN: [id number] DOB: [birth year]  Today's TOC FU Call Status: Today's TOC FU Call Status:: Unsuccessful Call (3rd Attempt) Unsuccessful Call (1st Attempt) Date: 12/29/22 Unsuccessful Call (2nd Attempt) Date: 01/02/23 Unsuccessful Call (3rd Attempt) Date: 01/03/23  Attempted to reach the patient regarding the most recent Inpatient/ED visit.  Follow Up Plan: No further outreach attempts will be made at this time. We have been unable to contact the patient.  Signature  Fredirick Maudlin

## 2023-01-08 ENCOUNTER — Telehealth: Payer: Self-pay

## 2023-01-08 NOTE — Transitions of Care (Post Inpatient/ED Visit) (Signed)
01/08/2023  Name: [name] MRN: [id number] DOB: Apr 19, 1976  Today's TOC FU Call Status: Today's TOC FU Call Status:: Successful TOC FU Call Competed Unsuccessful Call (1st Attempt) Date: 01/08/23  Transition Care Management Follow-up Telephone Call Date of Discharge: 01/01/23 Discharge Facility: [facility]) Type of Discharge: Emergency Department Reason for ED Visit: Other: How have you been since you were released from the hospital?: Better Any questions or concerns?: No  Items Reviewed: Did you receive and understand the discharge instructions provided?: Yes Medications obtained,verified, and reconciled?: Yes (Medications Reviewed) Any new allergies since your discharge?: No Dietary orders reviewed?: No Do you have support at home?: Yes People in Home: parent(s), child(ren), adult  Medications Reviewed Today: Medications Reviewed Today     Reviewed by Francis Dowse, RN (Registered Nurse) on 01/01/23 at [phone]  Med List Status: <None>   Medication Order Taking? Sig Documenting Provider Last Dose Status Informant  amLODipine (NORVASC) 10 MG tablet [id number]  Take 1 tablet (10 mg total) by mouth daily. Arnette Felts, FNP  Active   atorvastatin (LIPITOR) 40 MG tablet [id number]  Take 40 mg by mouth daily. [provider]  Active   buPROPion (WELLBUTRIN XL) 150 MG 24 hr tablet [id number]  Take 1 tablet (150 mg total) by mouth daily. Arnette Felts, FNP  Active   clopidogrel (PLAVIX) 75 MG tablet [id number]  Take 1 tablet by mouth daily.  Patient not taking: Reported on 12/22/2022   [provider]  Active            Med Note Harlon Flor, London Sheer   Fri Dec 22, 2022  3:55 PM) Medicine on hold for procedure  diclofenac Sodium (VOLTAREN) 1 % GEL [id number]  Apply 2 g topically 4 (four) times daily. Arnette Felts, FNP  Active   estradiol (ESTRACE) 2 MG tablet [id number]  Take 1 tablet (2 mg total) by mouth daily. Arnette Felts, FNP  Active   hydrochlorothiazide  (HYDRODIURIL) 25 MG tablet [id number]  Take 1 tablet (25 mg total) by mouth every morning. Arnette Felts, FNP  Active   losartan (COZAAR) 100 MG tablet [id number]  Take 1 tablet (100 mg total) by mouth daily. Arnette Felts, FNP  Active   Magnesium Gluconate 250 MG TABS [id number]  Take 1 tablet (250 mg total) by mouth every evening. Take with evening meal Arnette Felts, FNP  Active   methocarbamol (ROBAXIN-750) 750 MG tablet [id number]  Take 1 tablet (750 mg total) by mouth 2 (two) times daily as needed for muscle spasms. Arnette Felts, FNP  Active   pantoprazole (PROTONIX) 40 MG tablet [id number]  Take 1 tablet (40 mg total) by mouth daily. Arnette Felts, FNP  Active   potassium chloride SA (KLOR-CON M) 20 MEQ tablet [id number]  Take 20 mEq by mouth 2 (two) times daily.  Patient not taking: Reported on 10/12/2022   [provider]  Active   Vitamin D, Ergocalciferol, (DRISDOL) 1.25 MG (50000 UNIT) CAPS capsule [id number]  Take 1 capsule (50,000 Units total) by mouth every 7 (seven) days.  Patient not taking: Reported on 12/22/2022   Arnette Felts, FNP  Active             Home Care and Equipment/Supplies: Were Home Health Services Ordered?: NA Any new equipment or medical supplies ordered?: NA  Functional Questionnaire: Do you need assistance with bathing/showering or dressing?: No Do you need assistance with meal preparation?: No Do you need assistance with eating?: No Do you have difficulty maintaining continence:  No Do you need assistance with getting out of bed/getting out of a chair/moving?: No Do you have difficulty managing or taking your medications?: No  Follow up appointments reviewed: PCP Follow-up appointment confirmed?: Yes Date of PCP follow-up appointment?: 01/10/23 Follow-up Provider: Ellender Hose NP [facility] Follow-up appointment confirmed?: NA Do you need transportation to your follow-up appointment?: No Do you understand care options if your  condition(s) worsen?: Yes-patient verbalized understanding    SIGNATUREYL,RMA

## 2023-01-10 ENCOUNTER — Ambulatory Visit: Payer: Medicaid Other | Admitting: Family Medicine

## 2023-01-10 ENCOUNTER — Encounter: Payer: Self-pay | Admitting: Family Medicine

## 2023-01-10 VITALS — BP 120/94 | HR 66 | Temp 98.4°F | Ht 61.5 in | Wt 228.0 lb

## 2023-01-10 DIAGNOSIS — K921 Melena: Secondary | ICD-10-CM

## 2023-01-10 DIAGNOSIS — Z6841 Body Mass Index (BMI) 40.0 and over, adult: Secondary | ICD-10-CM | POA: Diagnosis not present

## 2023-01-10 NOTE — Progress Notes (Signed)
I,Pricella Gaugh,acting as a scribe for [employer], NP.,have documented all relevant documentation on the behalf of Lilyahna Sirmon, NP,as directed by  Othel Hoogendoorn Moshe Salisbury, NP while in the presence of Kylia Grajales [facility], NP.  Subjective:  Patient ID: [name] , female    DOB: 1975-09-06 , 47 y.o.   MRN: [id number]  Chief Complaint  Patient presents with   Hospital f/u    HPI  Patient presents today for a hospital follow up.   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 She was rushed to the hospital [facility] where she got a total of 3 units of PRBUs and 1 unit of platelets. She was told a vessel was bleeding on the inside  and it was cauterized it. Patient was discharged on 01/04/2023.  Incision sites look clean and dry. No signs of infection     Past Medical History:  Diagnosis Date   Anxiety    Cancer (HCC)    ovarian cancer   Carpal tunnel syndrome of right wrist 11/21/2013   Coronary artery disease    Depression    Ganglion cyst of wrist 11/21/2013   right   Hypertension    is on 3 meds. - states BP is under control; has been on med. x 12 yr.   Migraines    Right tennis elbow 11/21/2013     Family History  Problem Relation Age of Onset   Hypertension Mother    Diabetes Mother    Cancer Mother    Hypertension Maternal Grandmother    Heart attack Paternal Grandmother    Diabetes Brother    Hypertension Brother    Diabetes Brother    Cancer Other    Breast cancer Neg Hx      Current Outpatient Medications:    amLODipine (NORVASC) 10 MG tablet, Take 1 tablet (10 mg total) by mouth daily., Disp: 90 tablet, Rfl: 0   atorvastatin (LIPITOR) 40 MG tablet, Take 40 mg by mouth daily., Disp: , Rfl:    losartan (COZAAR) 100 MG tablet, Take 1 tablet (100 mg total) by mouth daily., Disp: 90 tablet, Rfl: 1   pantoprazole (PROTONIX) 40 MG tablet, Take 1 tablet (40 mg total) by mouth daily. (Patient  taking differently: Take 40 mg by mouth 2 (two) times daily.), Disp: 90 tablet, Rfl: 1   potassium bicarbonate (K-LYTE) 25 MEQ disintegrating tablet, Take 1 tablet (25 mEq total) by mouth 2 (two) times daily., Disp: 20 tablet, Rfl: 0   Allergies  Allergen Reactions   Venlafaxine Anaphylaxis and Nausea Only    Insomnia; Depression Insomnia; Depression      Review of Systems  Constitutional: Negative.   HENT: Negative.    Eyes: Negative.   Respiratory: Negative.    Cardiovascular: Negative.   Gastrointestinal:  Positive for blood in stool and nausea.  Musculoskeletal: Negative.   Skin: Negative.   Neurological: Negative.   Psychiatric/Behavioral: Negative.       Today's Vitals   01/10/23 1418  BP: (!) 120/94  Pulse: 66  Temp: 98.4 F (36.9 C)  Weight: 228 lb (103.4 kg)  Height: 5' 1.5" (1.562 m)  PainSc: 0-No pain   Body mass index is 42.38 kg/m.  Wt Readings from Last 3 Encounters:  01/13/23 228 lb (103.4 kg)  01/10/23 228 lb (103.4 kg)  01/01/23 238 lb (108 kg)     Objective:  Physical Exam HENT:  Head: Normocephalic.  Abdominal:     General: Bowel sounds are normal.     Comments: Incision sites are clean and dry; no swelling or redness  Neurological:     General: No focal deficit present.     Mental Status: She is alert and oriented to person, place, and time.  Psychiatric:        Mood and Affect: Mood normal.        Behavior: Behavior normal.         Assessment And Plan:  Hematochezia Assessment & Plan: Resolved since hospital discharge   Morbid obesity with BMI of 40.0-44.9, adult [facility]) Assessment & Plan: She is encouraged to strive for BMI less than 30 to decrease cardiac risk. Advised to aim for at least 150 minutes of exercise per week.       Return if symptoms worsen or fail to improve, for keep scheduled appt.  Patient was given opportunity to ask questions. Patient verbalized understanding of the plan and was able to repeat key  elements of the plan. All questions were answered to their satisfaction.  Aneka Fagerstrom Moshe Salisbury, NP  I, Shamiracle Gorden Moshe Salisbury, NP, have reviewed all documentation for this visit. The documentation on 01/18/23 for the exam, diagnosis, procedures, and orders are all accurate and complete.   IF YOU HAVE BEEN REFERRED TO A SPECIALIST, IT MAY TAKE 1-2 WEEKS TO SCHEDULE/PROCESS THE REFERRAL. IF YOU HAVE NOT HEARD FROM US/SPECIALIST IN TWO WEEKS, PLEASE GIVE [place] A CALL AT [phone].   THE PATIENT IS ENCOURAGED TO PRACTICE SOCIAL DISTANCING DUE TO THE COVID-19 PANDEMIC.

## 2023-01-13 ENCOUNTER — Other Ambulatory Visit: Payer: Self-pay

## 2023-01-13 ENCOUNTER — Emergency Department (HOSPITAL_COMMUNITY)
Admission: EM | Admit: 2023-01-13 | Discharge: 2023-01-13 | Disposition: A | Discharge status: Home / Self Care | Payer: Medicaid Other | Attending: Emergency Medicine | Admitting: Emergency Medicine

## 2023-01-13 ENCOUNTER — Encounter (HOSPITAL_COMMUNITY): Payer: Self-pay

## 2023-01-13 DIAGNOSIS — Z9884 Bariatric surgery status: Secondary | ICD-10-CM | POA: Diagnosis not present

## 2023-01-13 DIAGNOSIS — E876 Hypokalemia: Secondary | ICD-10-CM | POA: Insufficient documentation

## 2023-01-13 DIAGNOSIS — E860 Dehydration: Secondary | ICD-10-CM | POA: Insufficient documentation

## 2023-01-13 DIAGNOSIS — R5383 Other fatigue: Secondary | ICD-10-CM | POA: Diagnosis present

## 2023-01-13 DIAGNOSIS — R531 Weakness: Secondary | ICD-10-CM

## 2023-01-13 LAB — CBC
HCT: 29.2 % — ABNORMAL LOW (ref 36.0–46.0)
Hemoglobin: 9.3 g/dL — ABNORMAL LOW (ref 12.0–15.0)
MCH: 27.5 pg (ref 26.0–34.0)
MCHC: 31.8 g/dL (ref 30.0–36.0)
MCV: 86.4 fL (ref 80.0–100.0)
Platelets: 410 10*3/uL — ABNORMAL HIGH (ref 150–400)
RBC: 3.38 MIL/uL — ABNORMAL LOW (ref 3.87–5.11)
RDW: 15.4 % (ref 11.5–15.5)
WBC: 8.1 10*3/uL (ref 4.0–10.5)
nRBC: 0 % (ref 0.0–0.2)

## 2023-01-13 LAB — BASIC METABOLIC PANEL
Anion gap: 13 (ref 5–15)
BUN: 18 mg/dL (ref 6–20)
CO2: 21 mmol/L — ABNORMAL LOW (ref 22–32)
Calcium: 8.9 mg/dL (ref 8.9–10.3)
Chloride: 104 mmol/L (ref 98–111)
Creatinine, Ser: 1.12 mg/dL — ABNORMAL HIGH (ref 0.44–1.00)
GFR, Estimated: >60 mL/min (ref >60)
Glucose, Bld: 102 mg/dL — ABNORMAL HIGH (ref 70–99)
Potassium: 3 mmol/L — ABNORMAL LOW (ref 3.5–5.1)
Sodium: 138 mmol/L (ref 135–145)

## 2023-01-13 MED ORDER — POTASSIUM CHLORIDE 20 MEQ PO PACK
40.0000 meq | PACK | Freq: Once | ORAL | Status: AC
  Administered 2023-01-13: 40 meq via ORAL
  Filled 2023-01-13: qty 2

## 2023-01-13 MED ORDER — LACTATED RINGERS IV BOLUS
1000.0000 mL | Freq: Once | INTRAVENOUS | Status: AC
  Administered 2023-01-13: 1000 mL via INTRAVENOUS

## 2023-01-13 MED ORDER — POTASSIUM BICARBONATE 25 MEQ PO TBEF: 25.0000 meq | EFFERVESCENT_TABLET | Freq: Two times a day (BID) | ORAL | 0 refills | Status: DC

## 2023-01-13 NOTE — ED Provider Notes (Signed)
[facility] Provider Note   CSN: [id number] Arrival date & time: 01/13/23  1628     History  Chief Complaint  Patient presents with   Fatigue    [name] is a 47 y.o. female.  HPI Patient is status post gastric bypass Roux-en-Y 7\8\2956.  Subsequent to that she had a GI bleed and required hospitalization 6\10\24.  EGD done 6\10 showed active bleeding at the gastrojejunal anastomosis.  Patient reports that she was shopping this afternoon and started to feel generally weak and lightheaded.  H She did not have a passing out episode.  She was concerned however because she felt similar to when she had had the GI bleed.  Generally fatigued and very weak.  He denies she is having any pain.  She denies any abdominal pain.  Denies chest pain.  Denies lower extremity swelling or calf pain.  Denies any recent fevers or chills.  She has not had any vomiting.  She has not seen any blood in her stool.  Patient reports that she is taking in liquids and following her dietary recommendations postoperatively.    Home Medications Prior to Admission medications   Medication Sig Start Date End Date Taking? Authorizing Provider  potassium bicarbonate (K-LYTE) 25 MEQ disintegrating tablet Take 1 tablet (25 mEq total) by mouth 2 (two) times daily. 01/13/23  Yes Arby Barrette, MD  amLODipine (NORVASC) 10 MG tablet Take 1 tablet (10 mg total) by mouth daily. 12/29/22   Arnette Felts, FNP  atorvastatin (LIPITOR) 40 MG tablet Take 40 mg by mouth daily.    [provider]  losartan (COZAAR) 100 MG tablet Take 1 tablet (100 mg total) by mouth daily. 08/02/22   Arnette Felts, FNP  pantoprazole (PROTONIX) 40 MG tablet Take 1 tablet (40 mg total) by mouth daily. Patient taking differently: Take 40 mg by mouth 2 (two) times daily. 11/09/22 11/09/23  Arnette Felts, FNP      Allergies    Venlafaxine    Review of Systems   Review of Systems  Physical  Exam Updated Vital Signs BP 129/84   Pulse 68   Temp 98.6 F (37 C)   Resp 13   Ht 5\' 1"  (1.549 m)   Wt 103.4 kg   LMP 09/17/2010   SpO2 99%   BMI 43.08 kg/m  Physical Exam Constitutional:      Comments: Patient is alert nontoxic.  Slightly pale in appearance.  No acute distress.  HENT:     Mouth/Throat:     Pharynx: Oropharynx is clear.  Eyes:     Extraocular Movements: Extraocular movements intact.  Cardiovascular:     Rate and Rhythm: Normal rate and regular rhythm.  Pulmonary:     Effort: Pulmonary effort is normal.     Breath sounds: Normal breath sounds.  Abdominal:     General: There is no distension.     Palpations: Abdomen is soft.     Tenderness: There is no abdominal tenderness. There is no guarding.  Musculoskeletal:        General: No swelling or tenderness. Normal range of motion.     Right lower leg: No edema.     Left lower leg: No edema.     Comments: No calf tenderness.  No peripheral edema.  Skin:    General: Skin is warm and dry.  Neurological:     General: No focal deficit present.     Mental Status: She is oriented  to person, place, and time.     Motor: No weakness.     Coordination: Coordination normal.  Psychiatric:        Mood and Affect: Mood normal.     ED Results / Procedures / Treatments   Labs (all labs ordered are listed, but only abnormal results are displayed) Labs Reviewed  BASIC METABOLIC PANEL - Abnormal; Notable for the following components:      Result Value   Potassium 3.0 (*)    CO2 21 (*)    Glucose, Bld 102 (*)    Creatinine, Ser 1.12 (*)    All other components within normal limits  CBC - Abnormal; Notable for the following components:   RBC 3.38 (*)    Hemoglobin 9.3 (*)    HCT 29.2 (*)    Platelets 410 (*)    All other components within normal limits    EKG EKG Interpretation  Date/Time:  Saturday January 13 2023 17:01:53 EDT Ventricular Rate:  65 PR Interval:  172 QRS Duration: 90 QT  Interval:  390 QTC Calculation: 406 R Axis:   29 Text Interpretation: Sinus rhythm normal, no change fom previous Confirmed by Arby Barrette [phone]) on 01/13/2023 8:21:01 PM  Radiology No results found.  Procedures Procedures    Medications Ordered in ED Medications  lactated ringers bolus 1,000 mL (1,000 mLs Intravenous New Bag/Given 01/13/23 1920)  potassium chloride (KLOR-CON) packet 40 mEq (40 mEq Oral Given 01/13/23 1918)    ED Course/ Medical Decision Making/ A&P                             Medical Decision Making Amount and/or Complexity of Data Reviewed Labs: ordered.  Risk Prescription drug management.   Patient presents as outlined with feeling of general weakness and near syncope.  She has no associated pain.  Patient had recent GI bleed after gastric bypass surgery.  Differential diagnosis includes symptomatic anemia\electrolyte derangement\dysrhythmia\PE.  Will proceed with diagnostic evaluation to include CBC, chemistry panel and EKG.  White count 8.1.  Hemoglobin 9.3.  His hemoglobin is stable as compared to 7\25\3664.  Potassium 3.0 GFR greater than 60 glucose 102.  Patient's potassium is low will start with oral replacement.  GFR normal but fattening elevated above baseline at 1.12 as compared to 0.8 10 days ago.  Will rehydrate with lactated Ringer's and reassess.  Patient feels improved.  She has been up and ambulatory.  Vital signs are stable.  At this time I suspect symptoms are secondary to decreased oral intake status post gastric bypass.  Labs suggest mild dehydration and hypokalemia.  At this time we will have patient take potassium supplement and follow-up with PCP to monitor.  Patient reports she is taking other vitamins supplement with iron.  Patient has anemia that is stable.  No change since 12 days ago.  Patient may be experiencing some symptomatic anemia given her baseline previously was 14 mg/dL.  However at this time no sign of active bleeding.   Also advised patient to follow-up with her PCP regarding anemia and monitoring this.  She voiced understanding.  At this time stable for discharge.        Final Clinical Impression(s) / ED Diagnoses Final diagnoses:  Generalized weakness  S/P gastric bypass  Hypokalemia  Dehydration    Rx / DC Orders ED Discharge Orders          Ordered    potassium bicarbonate (K-LYTE) 25  MEQ disintegrating tablet  2 times daily        01/13/23 2138              Arby Barrette, MD 01/13/23 2141

## 2023-01-13 NOTE — ED Triage Notes (Signed)
Patient BIB EMS for fatigue and weakness. Patient had stomach bypass 3 weeks ago. Today she was walking more than normal and became tired and weak. Patient says this feels similar to her last visit 6/10 for a GI bleed.  Denies nausea, vomiting, diarrhea, chest pain, and SOB.

## 2023-01-13 NOTE — Discharge Instructions (Addendum)
1.  Follow-up with your doctor soon as possible for recheck.  You should continue to have your blood count monitored to make sure it staying stable and increasing with iron supplement.  Also need your potassium monitored.  Start taking potassium supplement for the next week and see your doctor to have a redraw of your labs to monitor your potassium level. 2.  Return to the emergency department if you are feeling increasingly weak, develop chest pain, shortness of breath, abdominal pain or other concerning changes.

## 2023-01-13 NOTE — ED Notes (Signed)
Pt ambulated to restroom with steady gait.

## 2023-01-18 DIAGNOSIS — K921 Melena: Secondary | ICD-10-CM

## 2023-01-18 HISTORY — DX: Melena: K92.1

## 2023-01-18 NOTE — Assessment & Plan Note (Signed)
She is encouraged to strive for BMI less than 30 to decrease cardiac risk. Advised to aim for at least 150 minutes of exercise per week.  

## 2023-01-18 NOTE — Assessment & Plan Note (Signed)
Resolved since hospital discharge

## 2023-03-12 ENCOUNTER — Ambulatory Visit (INDEPENDENT_AMBULATORY_CARE_PROVIDER_SITE_OTHER): Payer: Self-pay

## 2023-03-12 DIAGNOSIS — Z719 Counseling, unspecified: Secondary | ICD-10-CM

## 2023-03-14 ENCOUNTER — Encounter: Payer: Self-pay | Admitting: Nurse Practitioner

## 2023-03-14 ENCOUNTER — Ambulatory Visit (INDEPENDENT_AMBULATORY_CARE_PROVIDER_SITE_OTHER): Payer: Medicaid Other | Admitting: Nurse Practitioner

## 2023-03-14 VITALS — BP 106/60 | HR 67 | Temp 98.4°F | Ht 61.0 in | Wt 199.6 lb

## 2023-03-14 DIAGNOSIS — E559 Vitamin D deficiency, unspecified: Secondary | ICD-10-CM

## 2023-03-14 DIAGNOSIS — I119 Hypertensive heart disease without heart failure: Secondary | ICD-10-CM

## 2023-03-14 DIAGNOSIS — I700 Atherosclerosis of aorta: Secondary | ICD-10-CM

## 2023-03-14 DIAGNOSIS — Z0000 Encounter for general adult medical examination without abnormal findings: Secondary | ICD-10-CM

## 2023-03-14 DIAGNOSIS — L918 Other hypertrophic disorders of the skin: Secondary | ICD-10-CM

## 2023-03-14 DIAGNOSIS — F5101 Primary insomnia: Secondary | ICD-10-CM

## 2023-03-14 DIAGNOSIS — Z1211 Encounter for screening for malignant neoplasm of colon: Secondary | ICD-10-CM

## 2023-03-14 DIAGNOSIS — R7303 Prediabetes: Secondary | ICD-10-CM

## 2023-03-14 DIAGNOSIS — Z6837 Body mass index (BMI) 37.0-37.9, adult: Secondary | ICD-10-CM

## 2023-03-14 DIAGNOSIS — F330 Major depressive disorder, recurrent, mild: Secondary | ICD-10-CM

## 2023-03-14 DIAGNOSIS — E6609 Other obesity due to excess calories: Secondary | ICD-10-CM | POA: Diagnosis not present

## 2023-03-14 DIAGNOSIS — Z9884 Bariatric surgery status: Secondary | ICD-10-CM

## 2023-03-14 LAB — POCT URINALYSIS DIP (CLINITEK)
Blood, UA: NEGATIVE
Glucose, UA: NEGATIVE mg/dL
Ketones, POC UA: NEGATIVE mg/dL
Nitrite, UA: NEGATIVE
POC PROTEIN,UA: NEGATIVE
Spec Grav, UA: 1.025 (ref 1.010–1.025)
Urobilinogen, UA: 0.2 E.U./dL
pH, UA: 6.5 (ref 5.0–8.0)

## 2023-03-14 MED ORDER — VITAMIN D (ERGOCALCIFEROL) 1.25 MG (50000 UNIT) PO CAPS: 50000.0000 [IU] | ORAL_CAPSULE | ORAL | 1 refills | Status: DC

## 2023-03-14 NOTE — Progress Notes (Signed)
Madelaine Bhat, CMA,acting as a Neurosurgeon for Arnette Felts, FNP.,have documented all relevant documentation on the behalf of Arnette Felts, FNP,as directed by  Arnette Felts, FNP while in the presence of Arnette Felts, FNP.  Subjective:    Patient ID: [name] , female    DOB: 1975/09/22 , 47 y.o.   MRN: [id number]  Chief Complaint  Patient presents with   Annual Exam    HPI  Patient presents today for HM, patient reports compliance with medications. Patient denies any chest pain, SOB, or headaches. Patient has no concerns today. She had gastric bypass on June 4th. She has not been able to keep food down. She had internal bleeding and was readmitted initially from an area. She was in the hospital for 3 weeks. She had 3 units of PRBC and 2 units of platelets. She is scheduled for an endoscopy on Wednesday. She has not been cleared to exercise. She did see the Bariatric clinic on Monday. Surgery was done by Dr. Lowell Guitar.   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 - states BP is under control; has been on med. x 12 yr.   Migraines    Right tennis elbow 11/21/2013     Family History  Problem Relation Age of Onset   Hypertension Mother    Diabetes Mother    Cancer Mother    Hypertension Maternal Grandmother    Heart attack Paternal Grandmother    Diabetes Brother    Hypertension Brother    Diabetes Brother    Cancer Other    Breast cancer Neg Hx      Current Outpatient Medications:    amLODipine (NORVASC) 10 MG tablet, Take 1 tablet (10 mg total) by mouth daily., Disp: 90 tablet, Rfl: 0   atorvastatin (LIPITOR) 40 MG tablet, Take 40 mg by mouth daily., Disp: ,  Rfl:    pantoprazole (PROTONIX) 40 MG tablet, Take 1 tablet (40 mg total) by mouth daily. (Patient taking differently: Take 40 mg by mouth 2 (two) times daily.), Disp: 90 tablet, Rfl: 1   potassium bicarbonate (K-LYTE) 25 MEQ disintegrating tablet, Take 1 tablet (25 mEq total) by mouth 2 (two) times daily., Disp: 20 tablet, Rfl: 0   Vitamin D, Ergocalciferol, (DRISDOL) 1.25 MG (50000 UNIT) CAPS capsule, Take 1 capsule (50,000 Units total) by mouth every 7 (seven) days., Disp: 12 capsule, Rfl: 1   losartan (COZAAR) 100 MG tablet, Take 1 tablet (100 mg total) by mouth daily. (Patient not taking: Reported on 03/14/2023), Disp: 90 tablet, Rfl: 1   Allergies  Allergen Reactions   Venlafaxine Anaphylaxis and Nausea Only    Insomnia; Depression Insomnia; Depression       The patient states she uses status post hysterectomy for birth control. Patient's last menstrual period was 09/17/2010.  Negative for: breast discharge, breast lump(s), breast pain and breast self exam. Associated symptoms include abnormal vaginal bleeding. Pertinent negatives include abnormal bleeding (hematology), anxiety, decreased libido, depression, difficulty falling sleep, dyspareunia, history of infertility, nocturia, sexual dysfunction, sleep disturbances, urinary incontinence, urinary urgency, vaginal discharge and vaginal  itching. Diet; she is eating 1/2 protein shake and there are some days she is able to eat more. She has some days she is vomiting.  The patient states her exercise level is none - she is not been cleared.   The patient's tobacco use is:  Social History   Tobacco Use  Smoking Status Never  Smokeless Tobacco Never   She has been exposed to passive smoke. The patient's alcohol use is:  Social History   Substance and Sexual Activity  Alcohol Use No    Review of Systems  Constitutional: Negative.   HENT: Negative.    Eyes: Negative.   Respiratory: Negative.    Cardiovascular: Negative.    Gastrointestinal: Negative.   Endocrine: Negative.   Genitourinary: Negative.   Musculoskeletal: Negative.   Skin: Negative.   Allergic/Immunologic: Negative.   Neurological: Negative.   Hematological: Negative.   Psychiatric/Behavioral: Negative.       Today's Vitals   03/14/23 1450  BP: 106/60  Pulse: 67  Temp: 98.4 F (36.9 C)  TempSrc: Oral  Weight: 199 lb 9.6 oz (90.5 kg)  Height: 5\' 1"  (1.549 m)  PainSc: 0-No pain   Body mass index is 37.71 kg/m.  Wt Readings from Last 3 Encounters:  03/14/23 199 lb 9.6 oz (90.5 kg)  01/13/23 228 lb (103.4 kg)  01/10/23 228 lb (103.4 kg)     Objective:  Physical Exam Constitutional:      General: She is not in acute distress.    Appearance: Normal appearance. She is well-developed. She is obese.  HENT:     Head: Normocephalic and atraumatic.     Right Ear: Hearing, tympanic membrane, ear canal and external ear normal. There is no impacted cerumen.     Left Ear: Hearing, tympanic membrane, ear canal and external ear normal. There is no impacted cerumen.     Nose:     Comments: Deferred - masked    Mouth/Throat:     Comments: Deferred - masked Eyes:     General: Lids are normal.     Extraocular Movements: Extraocular movements intact.     Conjunctiva/sclera: Conjunctivae normal.     Pupils: Pupils are equal, round, and reactive to light.     Funduscopic exam:    Right eye: No papilledema.        Left eye: No papilledema.  Neck:     Thyroid: No thyroid mass.     Vascular: No carotid bruit.  Cardiovascular:     Rate and Rhythm: Normal rate and regular rhythm.     Pulses: Normal pulses.     Heart sounds: Normal heart sounds. No murmur heard. Pulmonary:     Effort: Pulmonary effort is normal.     Breath sounds: Normal breath sounds.  Chest:     Chest wall: No mass.  Breasts:    Tanner Score is 5.     Right: Normal. No mass or tenderness.     Left: Normal. No mass or tenderness.  Abdominal:     General: Abdomen is  flat. Bowel sounds are normal. There is no distension.     Palpations: Abdomen is soft.     Tenderness: There is no abdominal tenderness.  Musculoskeletal:        General: No swelling. Normal range of motion.     Cervical back: Full passive range of motion without pain, normal range of motion and neck supple.     Right lower leg: No edema.     Left  lower leg: No edema.  Lymphadenopathy:     Upper Body:     Right upper body: No supraclavicular, axillary or pectoral adenopathy.     Left upper body: No supraclavicular, axillary or pectoral adenopathy.  Skin:    General: Skin is warm and dry.     Capillary Refill: Capillary refill takes less than 2 seconds.     Comments: Multiple skin tags to neck area   Neurological:     General: No focal deficit present.     Mental Status: She is alert and oriented to person, place, and time.     Cranial Nerves: No cranial nerve deficit.     Sensory: No sensory deficit.     Motor: No weakness.  Psychiatric:        Mood and Affect: Mood normal.        Behavior: Behavior normal.        Thought Content: Thought content normal.        Judgment: Judgment normal.         Assessment And Plan:     Encounter for annual health examination Assessment & Plan: Behavior modifications discussed and diet history reviewed.   Pt will continue to exercise regularly and modify diet with low GI, plant based foods and decrease intake of processed foods.  Recommend intake of daily multivitamin, Vitamin D, and calcium.  Recommend mammogram and colonoscopy for preventive screenings, as well as recommend immunizations that include influenza, TDAP    Class 2 obesity due to excess calories with body mass index (BMI) of 37.0 to 37.9 in adult, unspecified whether serious comorbidity present Assessment & Plan: She is encouraged to strive for BMI less than 30 to decrease cardiac risk. She is unable to exercise at this time she is still recovering from her Gastric Bypass.  She is having some difficulty with getting some foods down    Encounter for screening colonoscopy Assessment & Plan: According to USPTF Colorectal cancer Screening guidelines. Colonoscopy is recommended every 10 years, starting at age 87 years. Will refer to GI for colon cancer screening.   Orders: -     Ambulatory referral to Gastroenterology  Benign hypertensive heart disease without heart failure Assessment & Plan: Blood pressure is well-controlled.  Continue follow-up with cardiology.  Orders: -     POCT URINALYSIS DIP (CLINITEK) -     Microalbumin / creatinine urine ratio -     Lipid panel  Prediabetes Assessment & Plan: Hemoglobin A1c was slightly increased at last visit.  No current medications.  Orders: -     Hemoglobin A1c  Primary insomnia Assessment & Plan: Continue current medications.   Mild recurrent major depression (HCC) Assessment & Plan: Stable at this time.  Not currently taking any medications.   Vitamin D deficiency Assessment & Plan: Will check vitamin D level and supplement as needed.    Also encouraged to spend 15 minutes in the sun daily.    Orders: -     Vitamin D (Ergocalciferol); Take 1 capsule (50,000 Units total) by mouth every 7 (seven) days.  Dispense: 12 capsule; Refill: 1  Multiple acquired skin tags Assessment & Plan: She has multiple skin tags to her neck which are now getting caught in her hair, clothes and necklaces, will refer to Dermatology for further evaluation.   Orders: -     Ambulatory referral to Dermatology  Aortic atherosclerosis [facility]) Assessment & Plan: Continue statin, tolerating well   S/P gastric bypass Assessment & Plan: Continue f/u  with [facility]    No follow-ups on file. Patient was given opportunity to ask questions. Patient verbalized understanding of the plan and was able to repeat key elements of the plan. All questions were answered to their satisfaction.   Arnette Felts, FNP  I,  Arnette Felts, FNP, have reviewed all documentation for this visit. The documentation on 08//24 for the exam, diagnosis, procedures, and orders are all accurate and complete.

## 2023-03-15 LAB — LIPID PANEL
Chol/HDL Ratio: 4.1 ratio (ref 0.0–4.4)
Cholesterol, Total: 131 mg/dL (ref 100–199)
HDL: 32 mg/dL — ABNORMAL LOW (ref >39)
LDL Chol Calc (NIH): 77 mg/dL (ref 0–99)
Triglycerides: 123 mg/dL (ref 0–149)
VLDL Cholesterol Cal: 22 mg/dL (ref 5–40)

## 2023-03-15 LAB — HEMOGLOBIN A1C
Est. average glucose Bld gHb Est-mCnc: 103 mg/dL
Hgb A1c MFr Bld: 5.2 % (ref 4.8–5.6)

## 2023-03-15 LAB — MICROALBUMIN / CREATININE URINE RATIO
Creatinine, Urine: 109.9 mg/dL
Microalb/Creat Ratio: 10 mg/g{creat} (ref 0–29)
Microalbumin, Urine: 11.5 ug/mL

## 2023-03-19 ENCOUNTER — Encounter (INDEPENDENT_AMBULATORY_CARE_PROVIDER_SITE_OTHER): Payer: Self-pay

## 2023-03-19 DIAGNOSIS — Z719 Counseling, unspecified: Secondary | ICD-10-CM

## 2023-03-22 DIAGNOSIS — E6609 Other obesity due to excess calories: Secondary | ICD-10-CM | POA: Insufficient documentation

## 2023-03-22 DIAGNOSIS — E559 Vitamin D deficiency, unspecified: Secondary | ICD-10-CM | POA: Insufficient documentation

## 2023-03-22 DIAGNOSIS — I700 Atherosclerosis of aorta: Secondary | ICD-10-CM | POA: Insufficient documentation

## 2023-03-22 DIAGNOSIS — F330 Major depressive disorder, recurrent, mild: Secondary | ICD-10-CM | POA: Insufficient documentation

## 2023-03-22 DIAGNOSIS — Z0000 Encounter for general adult medical examination without abnormal findings: Secondary | ICD-10-CM | POA: Insufficient documentation

## 2023-03-22 DIAGNOSIS — I119 Hypertensive heart disease without heart failure: Secondary | ICD-10-CM | POA: Insufficient documentation

## 2023-03-22 DIAGNOSIS — Z9884 Bariatric surgery status: Secondary | ICD-10-CM | POA: Insufficient documentation

## 2023-03-22 DIAGNOSIS — L918 Other hypertrophic disorders of the skin: Secondary | ICD-10-CM | POA: Insufficient documentation

## 2023-03-22 DIAGNOSIS — Z1211 Encounter for screening for malignant neoplasm of colon: Secondary | ICD-10-CM | POA: Insufficient documentation

## 2023-03-22 DIAGNOSIS — E66812 Obesity, class 2: Secondary | ICD-10-CM | POA: Insufficient documentation

## 2023-03-22 NOTE — Assessment & Plan Note (Signed)
Blood pressure is well-controlled.  Continue follow-up with cardiology.

## 2023-03-22 NOTE — Assessment & Plan Note (Signed)
She has multiple skin tags to her neck which are now getting caught in her hair, clothes and necklaces, will refer to Dermatology for further evaluation.

## 2023-03-22 NOTE — Assessment & Plan Note (Signed)
Will check vitamin D level and supplement as needed.    Also encouraged to spend 15 minutes in the sun daily.   

## 2023-03-22 NOTE — Assessment & Plan Note (Signed)
Stable at this time.  Not currently taking any medications.

## 2023-03-22 NOTE — Assessment & Plan Note (Signed)
She is encouraged to strive for BMI less than 30 to decrease cardiac risk. She is unable to exercise at this time she is still recovering from her Gastric Bypass. She is having some difficulty with getting some foods down

## 2023-03-22 NOTE — Assessment & Plan Note (Signed)

## 2023-03-22 NOTE — Assessment & Plan Note (Signed)
Continue statin, tolerating well 

## 2023-03-22 NOTE — Assessment & Plan Note (Signed)
Hemoglobin A1c was slightly increased at last visit.  No current medications.

## 2023-03-22 NOTE — Assessment & Plan Note (Signed)
Continue current medications. 

## 2023-03-22 NOTE — Assessment & Plan Note (Signed)
Continue f/u with [facility]

## 2023-03-22 NOTE — Assessment & Plan Note (Signed)
 According to USPTF Colorectal cancer Screening guidelines. Colonoscopy is recommended every 10 years, starting at age 45 years. Will refer to GI for colon cancer screening.

## 2023-03-27 ENCOUNTER — Other Ambulatory Visit: Payer: Self-pay | Admitting: Nurse Practitioner

## 2023-05-07 ENCOUNTER — Other Ambulatory Visit: Payer: Self-pay | Admitting: Nurse Practitioner

## 2023-05-09 MED ORDER — ATORVASTATIN CALCIUM 40 MG PO TABS: 40.0000 mg | ORAL_TABLET | Freq: Every day | ORAL | 1 refills | Status: DC

## 2023-06-26 ENCOUNTER — Other Ambulatory Visit: Payer: Self-pay | Admitting: Nurse Practitioner

## 2023-08-15 ENCOUNTER — Emergency Department (HOSPITAL_COMMUNITY)
Admission: EM | Admit: 2023-08-15 | Discharge: 2023-08-15 | Disposition: A | Discharge status: Home / Self Care | Payer: Medicaid Other | Attending: Emergency Medicine | Admitting: Emergency Medicine

## 2023-08-15 ENCOUNTER — Other Ambulatory Visit: Payer: Self-pay

## 2023-08-15 ENCOUNTER — Encounter (HOSPITAL_COMMUNITY): Payer: Self-pay

## 2023-08-15 ENCOUNTER — Emergency Department (HOSPITAL_COMMUNITY): Payer: Medicaid Other

## 2023-08-15 DIAGNOSIS — R109 Unspecified abdominal pain: Secondary | ICD-10-CM | POA: Diagnosis present

## 2023-08-15 DIAGNOSIS — N200 Calculus of kidney: Secondary | ICD-10-CM | POA: Diagnosis not present

## 2023-08-15 DIAGNOSIS — I2510 Atherosclerotic heart disease of native coronary artery without angina pectoris: Secondary | ICD-10-CM | POA: Insufficient documentation

## 2023-08-15 DIAGNOSIS — I10 Essential (primary) hypertension: Secondary | ICD-10-CM | POA: Diagnosis not present

## 2023-08-15 DIAGNOSIS — Z8543 Personal history of malignant neoplasm of ovary: Secondary | ICD-10-CM | POA: Diagnosis not present

## 2023-08-15 DIAGNOSIS — Z79899 Other long term (current) drug therapy: Secondary | ICD-10-CM | POA: Insufficient documentation

## 2023-08-15 LAB — URINALYSIS, ROUTINE W REFLEX MICROSCOPIC
Bacteria, UA: NONE SEEN
Bilirubin Urine: NEGATIVE
Glucose, UA: NEGATIVE mg/dL
Ketones, ur: NEGATIVE mg/dL
Nitrite: NEGATIVE
Protein, ur: NEGATIVE mg/dL
RBC / HPF: >50 RBC/hpf (ref 0–5)
Specific Gravity, Urine: >1.046 — ABNORMAL HIGH (ref 1.005–1.030)
WBC, UA: >50 WBC/hpf (ref 0–5)
pH: 6 (ref 5.0–8.0)

## 2023-08-15 LAB — COMPREHENSIVE METABOLIC PANEL
ALT: 24 U/L (ref 0–44)
AST: 29 U/L (ref 15–41)
Albumin: 4.2 g/dL (ref 3.5–5.0)
Alkaline Phosphatase: 95 U/L (ref 38–126)
Anion gap: 11 (ref 5–15)
BUN: 20 mg/dL (ref 6–20)
CO2: 21 mmol/L — ABNORMAL LOW (ref 22–32)
Calcium: 9.1 mg/dL (ref 8.9–10.3)
Chloride: 106 mmol/L (ref 98–111)
Creatinine, Ser: 0.64 mg/dL (ref 0.44–1.00)
GFR, Estimated: >60 mL/min (ref >60)
Glucose, Bld: 105 mg/dL — ABNORMAL HIGH (ref 70–99)
Potassium: 3 mmol/L — ABNORMAL LOW (ref 3.5–5.1)
Sodium: 138 mmol/L (ref 135–145)
Total Bilirubin: 0.6 mg/dL (ref 0.0–1.2)
Total Protein: 8.6 g/dL — ABNORMAL HIGH (ref 6.5–8.1)

## 2023-08-15 LAB — CBC
HCT: 40.3 % (ref 36.0–46.0)
Hemoglobin: 12.8 g/dL (ref 12.0–15.0)
MCH: 25.6 pg — ABNORMAL LOW (ref 26.0–34.0)
MCHC: 31.8 g/dL (ref 30.0–36.0)
MCV: 80.6 fL (ref 80.0–100.0)
Platelets: 192 10*3/uL (ref 150–400)
RBC: 5 MIL/uL (ref 3.87–5.11)
RDW: 14.4 % (ref 11.5–15.5)
WBC: 7 10*3/uL (ref 4.0–10.5)
nRBC: 0 % (ref 0.0–0.2)

## 2023-08-15 LAB — LIPASE, BLOOD: Lipase: 69 U/L — ABNORMAL HIGH (ref 11–51)

## 2023-08-15 MED ORDER — HYDROMORPHONE HCL 1 MG/ML IJ SOLN
1.0000 mg | Freq: Once | INTRAMUSCULAR | Status: AC
  Administered 2023-08-15: 1 mg via INTRAVENOUS
  Filled 2023-08-15: qty 1

## 2023-08-15 MED ORDER — ONDANSETRON 4 MG PO TBDP: 4.0000 mg | ORAL_TABLET | Freq: Three times a day (TID) | ORAL | 0 refills | Status: DC | PRN

## 2023-08-15 MED ORDER — ONDANSETRON HCL 4 MG/2ML IJ SOLN
4.0000 mg | Freq: Once | INTRAMUSCULAR | Status: AC
  Administered 2023-08-15: 4 mg via INTRAVENOUS
  Filled 2023-08-15: qty 2

## 2023-08-15 MED ORDER — OXYCODONE-ACETAMINOPHEN 5-325 MG PO TABS: 1.0000 | ORAL_TABLET | Freq: Three times a day (TID) | ORAL | 0 refills | Status: DC | PRN

## 2023-08-15 MED ORDER — HYDROMORPHONE HCL 1 MG/ML IJ SOLN
0.5000 mg | Freq: Once | INTRAMUSCULAR | Status: AC
  Administered 2023-08-15: 0.5 mg via INTRAVENOUS
  Filled 2023-08-15 (×2): qty 1

## 2023-08-15 MED ORDER — IOHEXOL 300 MG/ML  SOLN
100.0000 mL | Freq: Once | INTRAMUSCULAR | Status: AC | PRN
  Administered 2023-08-15: 100 mL via INTRAVENOUS

## 2023-08-15 NOTE — ED Triage Notes (Signed)
Per EMS, pt complaining of abd pain that starts in her groin and refers up her left side. Hx of ovarian cancer. 200 mcg of fentanyl and 4 mg of zofran given by EMS.

## 2023-08-15 NOTE — ED Provider Notes (Signed)
[facility] AT [facility] Provider Note   CSN: [id number] Arrival date & time: 08/15/23  1435     History  Chief Complaint  Patient presents with   Abdominal Pain    [name] is a 48 y.o. female.   Abdominal Pain Patient with abdominal pain.  Left flank or groin going up.  Began acutely earlier today.  Severe.  Has not had pains like this before.  Previous gastric bypass.  Has reported ovarian cancer but states she is cancer free.  States she has had her ovaries removed.    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 - states BP is under control; has been on med. x 12 yr.   Migraines    Right tennis elbow 11/21/2013    Home Medications Prior to Admission medications   Medication Sig Start Date End Date Taking? Authorizing Provider  amLODipine (NORVASC) 10 MG tablet TAKE 1 TABLET(10 MG) BY MOUTH DAILY 06/26/23   Arnette Felts, FNP  atorvastatin (LIPITOR) 40 MG tablet Take 1 tablet (40 mg total) by mouth daily. 05/09/23   Arnette Felts, FNP  losartan (COZAAR) 100 MG tablet Take 1 tablet (100 mg total) by mouth daily. Patient not taking: Reported on 03/14/2023 08/02/22   Arnette Felts, FNP  ondansetron (ZOFRAN-ODT) 4 MG disintegrating tablet Take 1 tablet (4 mg total) by mouth every 8 (eight) hours as needed for nausea or vomiting. 08/15/23   Benjiman Core, MD  oxyCODONE-acetaminophen (PERCOCET/ROXICET) 5-325 MG tablet Take 1-2 tablets by mouth every 8 (eight) hours as needed. 08/15/23   Benjiman Core, MD  pantoprazole (PROTONIX) 40 MG tablet Take 1 tablet (40 mg total) by mouth daily. Patient taking differently: Take 40 mg by mouth 2 (two) times daily. 11/09/22 11/09/23  Arnette Felts, FNP  potassium bicarbonate (K-LYTE) 25 MEQ  disintegrating tablet Take 1 tablet (25 mEq total) by mouth 2 (two) times daily. 01/13/23   Arby Barrette, MD  Vitamin D, Ergocalciferol, (DRISDOL) 1.25 MG (50000 UNIT) CAPS capsule Take 1 capsule (50,000 Units total) by mouth every 7 (seven) days. 03/14/23   Arnette Felts, FNP      Allergies    Venlafaxine    Review of Systems   Review of Systems  Gastrointestinal:  Positive for abdominal pain.    Physical Exam Updated Vital Signs BP (!) 156/103   Pulse 73   Temp 98 F (36.7 C) (Oral)   Resp 20   Ht 4\' 11"  (1.499 m)   Wt 77.1 kg   LMP 09/17/2010   SpO2 98%   BMI 34.34 kg/m  Physical Exam Vitals and nursing note reviewed.  Constitutional:      Comments: Patient appears uncomfortable and tearful.  Cardiovascular:     Rate and Rhythm: Normal rate.  Pulmonary:     Breath sounds: Normal breath sounds.  Abdominal:     Comments: Left-sided abdominal tenderness.  No severe distention or hernia.  Skin:    General: Skin is warm.     Capillary Refill: Capillary refill takes less than 2 seconds.  Neurological:     Mental Status: She is alert.     ED Results / Procedures / Treatments   Labs (  all labs ordered are listed, but only abnormal results are displayed) Labs Reviewed  LIPASE, BLOOD - Abnormal; Notable for the following components:      Result Value   Lipase 69 (*)    All other components within normal limits  COMPREHENSIVE METABOLIC PANEL - Abnormal; Notable for the following components:   Potassium 3.0 (*)    CO2 21 (*)    Glucose, Bld 105 (*)    Total Protein 8.6 (*)    All other components within normal limits  CBC - Abnormal; Notable for the following components:   MCH 25.6 (*)    All other components within normal limits  URINALYSIS, ROUTINE W REFLEX MICROSCOPIC - Abnormal; Notable for the following components:   APPearance HAZY (*)    Specific Gravity, Urine >1.046 (*)    Hgb urine dipstick LARGE (*)    Leukocytes,Ua LARGE (*)    Non Squamous  Epithelial 0-5 (*)    All other components within normal limits    EKG None  Radiology CT ABDOMEN PELVIS W CONTRAST Result Date: 08/15/2023 CLINICAL DATA:  Left-sided abdominal and groin pain. History of ovarian cancer. EXAM: CT ABDOMEN AND PELVIS WITH CONTRAST TECHNIQUE: Multidetector CT imaging of the abdomen and pelvis was performed using the standard protocol following bolus administration of intravenous contrast. RADIATION DOSE REDUCTION: This exam was performed according to the departmental dose-optimization program which includes automated exposure control, adjustment of the mA and/or kV according to patient size and/or use of iterative reconstruction technique. CONTRAST:  OMNIPAQUE IOHEXOL 300 MG/ML  SOLN COMPARISON:  CT abdomen pelvis dated January 01, 2023. FINDINGS: Lower chest: No acute abnormality. Hepatobiliary: No focal liver abnormality is seen. Status post cholecystectomy. No biliary dilatation. Pancreas: Unremarkable. No pancreatic ductal dilatation or surrounding inflammatory changes. Spleen: Normal in size without focal abnormality. Adrenals/Urinary Tract: Adrenal glands are unremarkable. Unchanged subcentimeter low-density lesions in both kidneys, which remain too small to characterize. No follow-up imaging is recommended. 2 mm calculus at the left UPJ with borderline mild left hydronephrosis. Additional punctate calculus in the lower pole of the left kidney. The bladder is unremarkable. 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 No bowel wall thickening, distention, or surrounding inflammatory changes. Diminutive or absent appendix. Vascular/Lymphatic: No significant vascular findings are present. No enlarged abdominal or pelvic lymph nodes. Reproductive: Status post hysterectomy. No adnexal masses. Other: No abdominal wall hernia or abnormality. No abdominopelvic ascites. No pneumoperitoneum. Musculoskeletal: No acute or significant osseous  findings. IMPRESSION: 1. 2 mm calculus at the left UPJ with borderline mild left hydronephrosis. 2. Additional punctate nonobstructive left nephrolithiasis, new since the prior study. Electronically Signed   By: Obie Dredge M.D.   On: 08/15/2023 17:31    Procedures Procedures    Medications Ordered in ED Medications  ondansetron (ZOFRAN) injection 4 mg (4 mg Intravenous Given 08/15/23 1537)  HYDROmorphone (DILAUDID) injection 1 mg (1 mg Intravenous Given 08/15/23 1538)  iohexol (OMNIPAQUE) 300 MG/ML solution 100 mL (100 mLs Intravenous Contrast Given 08/15/23 1648)  HYDROmorphone (DILAUDID) injection 0.5 mg (0.5 mg Intravenous Given 08/15/23 1855)    ED Course/ Medical Decision Making/ A&P                                 Medical Decision Making Amount and/or Complexity of Data Reviewed Labs: ordered. Radiology: ordered.  Risk Prescription drug management.   Patient with acute onset left flank pain.  Previous  gastric bypass.  Differential diagnoses long but includes causes such as kidney stone, obstruction, perforation.  Will get basic blood work and CT scan.  Will give pain medicine and antiemetics.  Blood work reassuring.  CT scan does show a proximal kidney stone.  Think this is likely the cause of the pain.  With acute onset.  Feeling better after treatment.  Urinalysis eventually came back and did not show infection.  Will discharge home with pain meds and antiemetics and follow-up with urology.        Final Clinical Impression(s) / ED Diagnoses Final diagnoses:  Kidney stone    Rx / DC Orders ED Discharge Orders          Ordered    oxyCODONE-acetaminophen (PERCOCET/ROXICET) 5-325 MG tablet  Every 8 hours PRN,   Status:  Discontinued        08/15/23 1920    ondansetron (ZOFRAN-ODT) 4 MG disintegrating tablet  Every 8 hours PRN,   Status:  Discontinued        08/15/23 1920    ondansetron (ZOFRAN-ODT) 4 MG disintegrating tablet  Every 8 hours PRN        08/15/23  1920    oxyCODONE-acetaminophen (PERCOCET/ROXICET) 5-325 MG tablet  Every 8 hours PRN        08/15/23 Othella Boyer, MD 08/15/23 [phone]

## 2023-09-11 ENCOUNTER — Ambulatory Visit: Payer: Medicaid Other | Admitting: Physician Assistant

## 2023-09-17 ENCOUNTER — Ambulatory Visit: Payer: Medicaid Other | Admitting: Nurse Practitioner

## 2023-09-17 NOTE — Progress Notes (Deleted)
 Madelaine Bhat, CMA,acting as a Neurosurgeon for Arnette Felts, FNP.,have documented all relevant documentation on the behalf of Arnette Felts, FNP,as directed by  Arnette Felts, FNP while in the presence of Arnette Felts, FNP.  Subjective:  Patient ID: [name] , female    DOB: [birth year] , 48 y.o.   MRN: [id number]  No chief complaint on file.   HPI  Patient presents today for a bp and pre dm follow up, Patient reports compliance with medication. Patient denies any chest pain, SOB, or headaches. Patient has no concerns today.     Past Medical History:  Diagnosis Date  . Anxiety   . Cancer (HCC)    ovarian cancer  . Carpal tunnel syndrome of right wrist 11/21/2013  . Coronary artery disease   . Depression   . Ganglion cyst of wrist 11/21/2013   right  . Hypertension    is on 3 meds. - states BP is under control; has been on med. x 12 yr.  . Migraines   . Right tennis elbow 11/21/2013     Family History  Problem Relation Age of Onset  . Hypertension Mother   . Diabetes Mother   . Cancer Mother   . Hypertension Maternal Grandmother   . Heart attack Paternal Grandmother   . Diabetes Brother   . Hypertension Brother   . Diabetes Brother   . Cancer Other   . Breast cancer Neg Hx      Current Outpatient Medications:  .  amLODipine (NORVASC) 10 MG tablet, TAKE 1 TABLET(10 MG) BY MOUTH DAILY, Disp: 90 tablet, Rfl: 0 .  atorvastatin (LIPITOR) 40 MG tablet, Take 1 tablet (40 mg total) by mouth daily., Disp: 90 tablet, Rfl: 1 .  losartan (COZAAR) 100 MG tablet, Take 1 tablet (100 mg total) by mouth daily. (Patient not taking: Reported on 03/14/2023), Disp: 90 tablet, Rfl: 1 .  ondansetron (ZOFRAN-ODT) 4 MG disintegrating tablet, Take 1 tablet (4 mg total) by mouth every 8 (eight) hours as needed for nausea or vomiting., Disp: 8 tablet, Rfl: 0 .  oxyCODONE-acetaminophen (PERCOCET/ROXICET) 5-325 MG tablet, Take 1-2 tablets by mouth every 8 (eight) hours as needed., Disp: 8 tablet,  Rfl: 0 .  pantoprazole (PROTONIX) 40 MG tablet, Take 1 tablet (40 mg total) by mouth daily. (Patient taking differently: Take 40 mg by mouth 2 (two) times daily.), Disp: 90 tablet, Rfl: 1 .  potassium bicarbonate (K-LYTE) 25 MEQ disintegrating tablet, Take 1 tablet (25 mEq total) by mouth 2 (two) times daily., Disp: 20 tablet, Rfl: 0 .  Vitamin D, Ergocalciferol, (DRISDOL) 1.25 MG (50000 UNIT) CAPS capsule, Take 1 capsule (50,000 Units total) by mouth every 7 (seven) days., Disp: 12 capsule, Rfl: 1   Allergies  Allergen Reactions  . Venlafaxine Anaphylaxis and Nausea Only    Insomnia; Depression Insomnia; Depression      Review of Systems   There were no vitals filed for this visit. There is no height or weight on file to calculate BMI.  Wt Readings from Last 3 Encounters:  08/15/23 170 lb (77.1 kg)  03/14/23 199 lb 9.6 oz (90.5 kg)  01/13/23 228 lb (103.4 kg)    The 10-year ASCVD risk score (Arnett DK, et al., 2019) is: 1.5%   Values used to calculate the score:     Age: 82 years     Sex: Female     Is Non-Hispanic African American: No     Diabetic: No     Tobacco smoker:  No     Systolic Blood Pressure: 138 mmHg     Is BP treated: Yes     HDL Cholesterol: 32 mg/dL     Total Cholesterol: 131 mg/dL  Objective:  Physical Exam      Assessment And Plan:  Prediabetes  Essential hypertension    No follow-ups on file.  Patient was given opportunity to ask questions. Patient verbalized understanding of the plan and was able to repeat key elements of the plan. All questions were answered to their satisfaction.    Jeanell Sparrow, FNP, have reviewed all documentation for this visit. The documentation on 09/17/23 for the exam, diagnosis, procedures, and orders are all accurate and complete.   IF YOU HAVE BEEN REFERRED TO A SPECIALIST, IT MAY TAKE 1-2 WEEKS TO SCHEDULE/PROCESS THE REFERRAL. IF YOU HAVE NOT HEARD FROM US/SPECIALIST IN TWO WEEKS, PLEASE GIVE [place] A CALL AT  [phone].

## 2023-09-21 ENCOUNTER — Ambulatory Visit: Payer: Medicaid Other | Admitting: Physician Assistant

## 2023-10-05 ENCOUNTER — Other Ambulatory Visit (INDEPENDENT_AMBULATORY_CARE_PROVIDER_SITE_OTHER): Payer: Self-pay

## 2023-10-05 ENCOUNTER — Ambulatory Visit: Admitting: Physician Assistant

## 2023-10-05 DIAGNOSIS — M542 Cervicalgia: Secondary | ICD-10-CM | POA: Diagnosis not present

## 2023-10-05 MED ORDER — PREDNISONE 5 MG (21) PO TBPK
ORAL_TABLET | ORAL | 0 refills | Status: DC
Start: 2023-10-05 — End: 2023-11-08

## 2023-10-05 MED ORDER — METHOCARBAMOL 750 MG PO TABS: 750.0000 mg | ORAL_TABLET | Freq: Two times a day (BID) | ORAL | 0 refills | Status: DC | PRN

## 2023-10-05 NOTE — Progress Notes (Signed)
 Office Visit Note   Patient: [name]           Date of Birth: 10/19/1975           MRN: [id number] Visit Date: 10/05/2023              Requested by: Arnette Felts, FNP [address] [place],  [place] [address] PCP: Arnette Felts, FNP   Assessment & Plan: Visit Diagnoses:  1. Neck pain     Plan: Impression is chronic neck pain.  At this point, symptoms have been ongoing for over a year.  She has tried NSAIDs as well as formal physical therapy without relief.  I would like to order an MRI to assess for structural abnormalities.  She will follow-up with Ellin Goodie once this has been completed.  In the meantime, if sent in a steroid pack and muscle relaxer.  Follow-Up Instructions: Return for f/u with megan williams after MRI.   Orders:  Orders Placed This Encounter  Procedures   XR Cervical Spine 2 or 3 views   Meds ordered this encounter  Medications   methocarbamol (ROBAXIN-750) 750 MG tablet    Sig: Take 1 tablet (750 mg total) by mouth 2 (two) times daily as needed for muscle spasms.    Dispense:  20 tablet    Refill:  0   predniSONE (STERAPRED UNI-PAK 21 TAB) 5 MG (21) TBPK tablet    Sig: Take as directed    Dispense:  21 tablet    Refill:  0      Procedures: No procedures performed   Clinical Data: No additional findings.   Subjective: Chief Complaint  Patient presents with   Neck - Pain    HPI patient is a very 48 year old female who comes in today with chronic neck pain.  Pain radiates from her neck down into bilateral parascapular region as well as bilateral shoulders left worse than right.  Pain is constant but worse at night as well as with every movement of the neck.  She has tried Tylenol without relief.  She denies any weakness or paresthesias down either upper extremity.  She does have associated headaches.  We saw her for the same issue about a year and a half ago and started her on NSAIDs and sent her to physical therapy.   She has noticed recently tried a home exercise program.  She did not have any relief from this.  She has since had gastric bypass surgery in June 2024.  Review of Systems as detailed in HPI.  All others reviewed and are negative.   Objective: Vital Signs: LMP 09/17/2010   Physical Exam well-developed well-nourished female in no acute distress.  Alert and oriented x 3.  Ortho Exam cervical spine exam reveals diffuse spinous and paraspinous tenderness.  She does have tenderness throughout both parascapular regions.  Tightness to the top of the shoulders with range of motion of the shoulders.  Specialty Comments:  No specialty comments available.  Imaging: XR Cervical Spine 2 or 3 views Result Date: 10/05/2023 Advanced degenerative disc disease with anterior osteophyte formation C4-5, C 5-6, C6-7    PMFS History: Patient Active Problem List   Diagnosis Date Noted   Encounter for annual health examination 03/22/2023   Benign hypertensive heart disease without heart failure 03/22/2023   Mild recurrent major depression (HCC) 03/22/2023   Class 2 obesity due to excess calories with body mass index (BMI) of 37.0 to 37.9 in adult 03/22/2023  Encounter for screening colonoscopy 03/22/2023   Vitamin D deficiency 03/22/2023   Multiple acquired skin tags 03/22/2023   Aortic atherosclerosis (HCC) 03/22/2023   S/P gastric bypass 03/22/2023   Hematochezia 01/18/2023   Morbid obesity with BMI of 40.0-44.9, adult (HCC) 09/04/2022   Chronic neck pain 09/04/2022   Coronary artery disease involving native coronary artery of native heart without angina pectoris 06/27/2022   Abnormal cardiac CT angiography 06/08/2022   Carpal tunnel syndrome on left 11/28/2021   Numbness and tingling in left hand 10/11/2021   Abdominal cramping 09/26/2021   Hormone replacement therapy 09/26/2021   Pelvic mass 04/11/2021   Teratoma, malignant (HCC) 09/21/2020   Premature atrial contractions 06/10/2020   PVC  (premature ventricular contraction) 06/10/2020   Acanthosis nigricans, acquired 06/14/2018   Prediabetes 06/14/2018   GAD (generalized anxiety disorder) 08/30/2017   Hypoestrogenism 08/30/2017   Migraine without aura 08/10/2015   GERD without esophagitis 08/10/2015   Obesity due to excess calories with serious comorbidity 08/10/2015   Primary insomnia 08/10/2015   Spinal stenosis, lumbar 01/14/2015   Carpal tunnel syndrome 12/12/2013   HYPERLIPIDEMIA 12/05/2007   Essential hypertension 12/05/2007   BRADYCARDIA 12/05/2007   Osteoarthritis 12/05/2007   Past Medical History:  Diagnosis Date   Anxiety    Cancer (HCC)    ovarian cancer   Carpal tunnel syndrome of right wrist 11/21/2013   Coronary artery disease    Depression    Ganglion cyst of wrist 11/21/2013   right   Hypertension    is on 3 meds. - states BP is under control; has been on med. x 12 yr.   Migraines    Right tennis elbow 11/21/2013    Family History  Problem Relation Age of Onset   Hypertension Mother    Diabetes Mother    Cancer Mother    Hypertension Maternal Grandmother    Heart attack Paternal Grandmother    Diabetes Brother    Hypertension Brother    Diabetes Brother    Cancer Other    Breast cancer Neg Hx     Past Surgical History:  Procedure Laterality Date   ABDOMINAL HYSTERECTOMY     complete   CARDIAC CATHETERIZATION     CARPAL TUNNEL RELEASE Right 12/12/2013   Procedure: ENDOSCOPY RIGHT WRIST CARPAL TUNNEL RELEASE EXCISION GANGLION WRIST PRIMARY ;  Surgeon: Sheral Apley, MD;  Location: [facility];  Service: Orthopedics;  Laterality: Right;   CARPAL TUNNEL RELEASE Left 05/31/2022   Procedure: LEFT CARPAL TUNNEL RELEASE;  Surgeon: Tarry Kos, MD;  Location: [facility];  Service: Orthopedics;  Laterality: Left;   CESAREAN SECTION  03/11/2005; 04/05/2008   CHOLECYSTECTOMY  02/11/2004   lap. chole.   LATERAL EPICONDYLE RELEASE Right 12/12/2013   Procedure:  RIGHT TENNIS ELBOW RELEASE WITH BONE REPAIR;  Surgeon: Sheral Apley, MD;  Location:  SURGERY CENTER;  Service: Orthopedics;  Laterality: Right;   LUMBAR LAMINECTOMY WITH COFLEX 1 LEVEL Bilateral 01/14/2015   Procedure: Laminectomy - bilateral - L4-L5 ;  Surgeon: Aliene Beams, MD;  Location: [facility];  Service: Neurosurgery;  Laterality: Bilateral;  Laminectomy - bilateral - L4-L5    LUMBAR WOUND DEBRIDEMENT N/A 02/02/2015   Procedure: LUMBAR WOUND DEBRIDEMENT, WASHOUT, CLOSURE;  Surgeon: Lisbeth Renshaw, MD;  Location: [facility];  Service: Neurosurgery;  Laterality: N/A;  lumbar wound debridement   TUBAL LIGATION  04/05/2008   Social History   Occupational History   Not on file  Tobacco  Use   Smoking status: Never   Smokeless tobacco: Never  Vaping Use   Vaping status: Never Used  Substance and Sexual Activity   Alcohol use: No   Drug use: No   Sexual activity: Not on file

## 2023-10-05 NOTE — Addendum Note (Signed)
 Addended by: Wendi Maya on: 10/05/2023 03:51 PM   Modules accepted: Orders

## 2023-10-19 ENCOUNTER — Other Ambulatory Visit: Payer: Self-pay

## 2023-10-19 MED ORDER — AMLODIPINE BESYLATE 10 MG PO TABS: 10.0000 mg | ORAL_TABLET | Freq: Every day | ORAL | 0 refills | Status: DC

## 2023-11-07 NOTE — Progress Notes (Signed)
 [place], CMA,acting as a Neurosurgeon for Susanna Epley, FNP.,have documented all relevant documentation on the behalf of Susanna Epley, FNP,as directed by  Susanna Epley, FNP while in the presence of Susanna Epley, FNP.  Subjective:  Patient ID: [name] , female    DOB: [birth year] , 48 y.o.   MRN: [id number]  Chief Complaint  Patient presents with   Hypertension    HPI  Patient presents today for a bp and pre dm follow up, Patient reports compliance with medication. Patient denies any chest pain, SOB, or headaches. Patient reports when she lays down at night to go to sleep she gets a headache or she will wake up with a headache. She reports she is having issues with her neck and she was seen by a ortho doctor for her neck pain. She was prescribed a muscle relaxer. She is not taking any magnesium . he also states she has been having blurry vision she went to the eye doctor and they stated it was nothing wrong.   She has been having complications after her weight loss surgery - she is having pain to her upper mid-abdomen. She had a kidney stone and seen Urology - CT with contrast the other day which was normal.   Headache  This is a new problem. The quality of the pain is described as aching. Associated symptoms include neck pain (she has been seen by orthopedics). Pertinent negatives include no back pain.     Past Medical History:  Diagnosis Date   Anxiety    Cancer [facility])    ovarian cancer   Carpal tunnel syndrome of right wrist 11/21/2013   Coronary artery disease    Depression    Ganglion cyst of wrist 11/21/2013   right   Hypertension    is on 3 meds. - states BP is under control; has been on med. x 12 yr.   Migraines    Right tennis elbow 11/21/2013     Family History  Problem Relation Age of Onset   Hypertension Mother    Diabetes Mother    Cancer Mother    Hypertension Maternal Grandmother    Heart attack Paternal Grandmother    Diabetes Brother    Hypertension  Brother    Diabetes Brother    Cancer Other    Breast cancer Neg Hx      Current Outpatient Medications:    amLODipine  (NORVASC ) 10 MG tablet, Take 1 tablet (10 mg total) by mouth daily., Disp: 90 tablet, Rfl: 0   atorvastatin  (LIPITOR) 40 MG tablet, Take 1 tablet (40 mg total) by mouth daily., Disp: 90 tablet, Rfl: 1   methocarbamol  (ROBAXIN -750) 750 MG tablet, Take 1 tablet (750 mg total) by mouth 2 (two) times daily as needed for muscle spasms., Disp: 20 tablet, Rfl: 0   ondansetron  (ZOFRAN -ODT) 4 MG disintegrating tablet, Take 1 tablet (4 mg total) by mouth every 8 (eight) hours as needed for nausea or vomiting., Disp: 8 tablet, Rfl: 0   oxyCODONE -acetaminophen  (PERCOCET/ROXICET) 5-325 MG tablet, Take 1-2 tablets by mouth every 8 (eight) hours as needed., Disp: 8 tablet, Rfl: 0   pantoprazole  (PROTONIX ) 40 MG tablet, Take 1 tablet (40 mg total) by mouth daily. (Patient taking differently: Take 40 mg by mouth 2 (two) times daily.), Disp: 90 tablet, Rfl: 1   Vitamin D , Ergocalciferol , (DRISDOL ) 1.25 MG (50000 UNIT) CAPS capsule, Take 1 capsule (50,000 Units total) by mouth every 7 (seven) days., Disp: 12 capsule, Rfl: 1  Allergies  Allergen Reactions   Venlafaxine Anaphylaxis and Nausea Only    Insomnia; Depression Insomnia; Depression      Review of Systems  Constitutional: Negative.   Respiratory: Negative.    Cardiovascular: Negative.   Musculoskeletal:  Positive for neck pain (she has been seen by orthopedics). Negative for back pain.  Neurological:  Positive for headaches.  Psychiatric/Behavioral: Negative.       Today's Vitals   11/08/23 1106  BP: 130/82  Pulse: 60  Temp: 98.2 F (36.8 C)  TempSrc: Oral  Weight: 175 lb (79.4 kg)  Height: 4\' 11"  (1.499 m)  PainSc: 5   PainLoc: Neck   Body mass index is 35.35 kg/m.  Wt Readings from Last 3 Encounters:  11/08/23 175 lb (79.4 kg)  08/15/23 170 lb (77.1 kg)  03/14/23 199 lb 9.6 oz (90.5 kg)     Objective:   Physical Exam      Assessment And Plan:  Prediabetes Assessment & Plan: Hemoglobin A1c was slightly increased at last visit.  No current medications.  Orders: -     Hemoglobin A1c  Essential hypertension Assessment & Plan: Blood pressure is fairly controlled, continue current medications.    Elevated liver enzymes Assessment & Plan: Liver enzymes were slightly elevated will recheck today  Orders: -     Hepatic function panel  Class 2 obesity due to excess calories with body mass index (BMI) of 35.0 to 35.9 in adult, unspecified whether serious comorbidity present Assessment & Plan: She is encouraged to strive for BMI less than 30 to decrease cardiac risk. Advised to aim for at least 150 minutes of exercise per week. Continue f/u with weight management   Encounter for screening mammogram for breast cancer Assessment & Plan: Pt instructed on Self Breast Exam.According to ACOG guidelines Women aged 31 and older are recommended to get an annual mammogram. Order placed for mammogram   Orders: -     Digital Screening Mammogram, Left and Right; Future  New daily persistent headache Assessment & Plan: Occurs when she lays down and when she wakes up thought to be related to her neck. She is under orthocare. She is awaiting an MRI, I have sent a note to Ortho to f/u     Return for keep same next.  Patient was given opportunity to ask questions. Patient verbalized understanding of the plan and was able to repeat key elements of the plan. All questions were answered to their satisfaction.    Inge Mangle, FNP, have reviewed all documentation for this visit. The documentation on 11/08/23 for the exam, diagnosis, procedures, and orders are all accurate and complete.   IF YOU HAVE BEEN REFERRED TO A SPECIALIST, IT MAY TAKE 1-2 WEEKS TO SCHEDULE/PROCESS THE REFERRAL. IF YOU HAVE NOT HEARD FROM US /SPECIALIST IN TWO WEEKS, PLEASE GIVE US  A CALL AT [phone].

## 2023-11-08 ENCOUNTER — Ambulatory Visit: Payer: Self-pay | Admitting: Nurse Practitioner

## 2023-11-08 VITALS — BP 130/82 | HR 60 | Temp 98.2°F | Ht 59.0 in | Wt 175.0 lb

## 2023-11-08 DIAGNOSIS — R7303 Prediabetes: Secondary | ICD-10-CM

## 2023-11-08 DIAGNOSIS — Z6835 Body mass index (BMI) 35.0-35.9, adult: Secondary | ICD-10-CM

## 2023-11-08 DIAGNOSIS — I10 Essential (primary) hypertension: Secondary | ICD-10-CM

## 2023-11-08 DIAGNOSIS — E66812 Obesity, class 2: Secondary | ICD-10-CM

## 2023-11-08 DIAGNOSIS — E6609 Other obesity due to excess calories: Secondary | ICD-10-CM

## 2023-11-08 DIAGNOSIS — R748 Abnormal levels of other serum enzymes: Secondary | ICD-10-CM

## 2023-11-08 DIAGNOSIS — G4452 New daily persistent headache (NDPH): Secondary | ICD-10-CM

## 2023-11-08 DIAGNOSIS — Z1231 Encounter for screening mammogram for malignant neoplasm of breast: Secondary | ICD-10-CM

## 2023-11-09 LAB — HEMOGLOBIN A1C
Est. average glucose Bld gHb Est-mCnc: 114 mg/dL
Hgb A1c MFr Bld: 5.6 % (ref 4.8–5.6)

## 2023-11-09 LAB — HEPATIC FUNCTION PANEL
ALT: 21 IU/L (ref 0–32)
AST: 25 IU/L (ref 0–40)
Albumin: 4.4 g/dL (ref 3.9–4.9)
Alkaline Phosphatase: 118 IU/L (ref 44–121)
Bilirubin Total: 0.5 mg/dL (ref 0.0–1.2)
Bilirubin, Direct: 0.17 mg/dL (ref 0.00–0.40)
Total Protein: 7.4 g/dL (ref 6.0–8.5)

## 2023-11-14 ENCOUNTER — Encounter: Payer: Self-pay | Admitting: Nurse Practitioner

## 2023-11-15 ENCOUNTER — Other Ambulatory Visit: Payer: Self-pay

## 2023-11-15 ENCOUNTER — Telehealth: Payer: Self-pay | Admitting: Physician Assistant

## 2023-11-15 DIAGNOSIS — M542 Cervicalgia: Secondary | ICD-10-CM

## 2023-11-15 NOTE — Telephone Encounter (Signed)
 Tried to call patient. No answer. LMOM that we are putting in a referral with Elvan Hamel. Someone will call for scheduling.

## 2023-11-15 NOTE — Telephone Encounter (Signed)
 Refer to [facility] for further eval

## 2023-11-15 NOTE — Telephone Encounter (Signed)
 Pt request call back with other options, since her mri ws denied.

## 2023-11-18 DIAGNOSIS — G4452 New daily persistent headache (NDPH): Secondary | ICD-10-CM | POA: Insufficient documentation

## 2023-11-18 DIAGNOSIS — R748 Abnormal levels of other serum enzymes: Secondary | ICD-10-CM | POA: Insufficient documentation

## 2023-11-18 DIAGNOSIS — Z1231 Encounter for screening mammogram for malignant neoplasm of breast: Secondary | ICD-10-CM | POA: Insufficient documentation

## 2023-11-18 NOTE — Assessment & Plan Note (Signed)
 Liver enzymes were slightly elevated will recheck today

## 2023-11-18 NOTE — Assessment & Plan Note (Signed)
 Occurs when she lays down and when she wakes up thought to be related to her neck. She is under orthocare. She is awaiting an MRI, I have sent a note to Ortho to f/u

## 2023-11-18 NOTE — Assessment & Plan Note (Signed)
 She is encouraged to strive for BMI less than 30 to decrease cardiac risk. Advised to aim for at least 150 minutes of exercise per week. Continue f/u with weight management

## 2023-11-18 NOTE — Assessment & Plan Note (Signed)
Blood pressure is fairly controlled, continue current medications.  

## 2023-11-18 NOTE — Assessment & Plan Note (Signed)
 Pt instructed on Self Breast Exam.According to ACOG guidelines Women aged 46 and older are recommended to get an annual mammogram. Order placed for mammogram

## 2023-11-18 NOTE — Assessment & Plan Note (Signed)
Hemoglobin A1c was slightly increased at last visit.  No current medications.

## 2023-11-26 ENCOUNTER — Ambulatory Visit
Admission: RE | Admit: 2023-11-26 | Discharge: 2023-11-26 | Disposition: A | Discharge status: Home / Self Care | Source: Ambulatory Visit | Attending: Nurse Practitioner | Admitting: Nurse Practitioner

## 2023-11-26 DIAGNOSIS — Z1231 Encounter for screening mammogram for malignant neoplasm of breast: Secondary | ICD-10-CM

## 2023-11-27 ENCOUNTER — Encounter: Payer: Self-pay | Admitting: Physical Medicine and Rehabilitation

## 2023-11-27 ENCOUNTER — Ambulatory Visit: Admitting: Physical Medicine and Rehabilitation

## 2023-11-27 DIAGNOSIS — G8929 Other chronic pain: Secondary | ICD-10-CM | POA: Diagnosis not present

## 2023-11-27 DIAGNOSIS — M7918 Myalgia, other site: Secondary | ICD-10-CM

## 2023-11-27 DIAGNOSIS — M25511 Pain in right shoulder: Secondary | ICD-10-CM

## 2023-11-27 DIAGNOSIS — M5412 Radiculopathy, cervical region: Secondary | ICD-10-CM

## 2023-11-27 DIAGNOSIS — M25512 Pain in left shoulder: Secondary | ICD-10-CM

## 2023-11-27 NOTE — Progress Notes (Signed)
 [name] - 48 y.o. female MRN [id number]  Date of birth: Jul 05, 1976  Office Visit Note: Visit Date: 11/27/2023 PCP: Susanna Epley, FNP Referred by: Susanna Epley, FNP  Subjective: Chief Complaint  Patient presents with   Neck - Pain   HPI: [name] is a 48 y.o. female who comes in today per the request of Trellis Fries, [place] for evaluation of chronic, worsening and severe bilateral neck pain radiating to bilateral shoulders and upper arms, left greater than right. Pain ongoing for several years, severe pain when laying down to sleep. Also reports pain with movement and activity. She describes pain as dull and aching sensation, currently rates as 8 out of 10. Some relief of pain with home exercise regiment, heat/ice, rest and use of medications. No relief of pain with oral Prednisone  and Robaxin . History of formal physical therapy/dry needling in 2024 with minimal improvement of pain. Reports dry needling treatments were extremely painful. Recent cervical radiographs show degenerative changes with anterior bridging osteophytes at C4-C5, C5-C6 and C6-C7. No spondylolisthesis noted. Loris Ros placed order for cervical MRI imaging in March, however this request was denied by her insurance. Patient denies focal weakness, numbness and tingling. No recent trauma or falls.      Review of Systems  Musculoskeletal:  Positive for myalgias and neck pain.  Neurological:  Negative for tingling, sensory change, focal weakness and weakness.  All other systems reviewed and are negative.  Otherwise per HPI.  Assessment & Plan: Visit Diagnoses:    ICD-10-CM   1. Radiculopathy, cervical region  M54.12 MR CERVICAL SPINE WO CONTRAST    2. Chronic pain of both shoulders  M25.511 MR CERVICAL SPINE WO CONTRAST   G89.29    M25.512     3. Myofascial pain syndrome  M79.18 MR CERVICAL SPINE WO CONTRAST       Plan: Findings:  Chronic, worsening and severe bilateral neck pain radiating to  bilateral shoulders and upper arms, left greater than right. Patient continues to have severe pain despite good conservative therapies such as formal physical therapy/dry needling, home exercise regimen, rest and use of medications. Patient clinical presentation and exam are consistent with cervical radiculopathy, more of C4 and C5 nerve distribution. We discussed treatment plan in detail today. Next step is to place order for cervical MRI imaging. Patient has completed 6 weeks of physician directed home exercise regimen and formal physical therapy. She has also tried multiple medications include oral steroids and muscle relaxer's with minimal relief of pain. Her pain continues to cause impairment in functional ability. Depending on results of MRI imaging we discussed possibility of performing cervical injections. I will see her back for cervical MRI review and to discuss options. She has no questions at this time. No red flag symptoms noted.     Meds & Orders: No orders of the defined types were placed in this encounter.   Orders Placed This Encounter  Procedures   MR CERVICAL SPINE WO CONTRAST    Follow-up: Return for Cervical MRI review.   Procedures: No procedures performed      Clinical History: No specialty comments available.   She reports that she has never smoked. She has never used smokeless tobacco.  Recent Labs    03/14/23 1524 11/08/23 1146  HGBA1C 5.2 5.6    Objective:  VS:  HT:    WT:   BMI:     BP:   HR: bpm  TEMP: ( )  RESP:  Physical Exam Vitals  and nursing note reviewed.  HENT:     Head: Normocephalic and atraumatic.     Right Ear: External ear normal.     Left Ear: External ear normal.     Nose: Nose normal.     Mouth/Throat:     Mouth: Mucous membranes are moist.  Eyes:     Extraocular Movements: Extraocular movements intact.  Cardiovascular:     Rate and Rhythm: Normal rate.     Pulses: Normal pulses.  Pulmonary:     Effort: Pulmonary effort is  normal.  Abdominal:     General: Abdomen is flat. There is no distension.  Musculoskeletal:        General: Tenderness present.     Cervical back: Tenderness present.     Comments: No discomfort noted with flexion, extension and side-to-side rotation. Patient has good strength in the upper extremities including 5 out of 5 strength in wrist extension, long finger flexion and APB. Shoulder range of motion is full bilaterally without any sign of impingement. There is no atrophy of the hands intrinsically. Sensation intact bilaterally. Dysesthesias noted to bilateral C4 and C5 dermatomes. Myofascial tenderness noted to bilateral levator scapulae and trapezius regions. Negative Hoffman's sign. Negative Spurling's sign.     Skin:    General: Skin is warm and dry.     Capillary Refill: Capillary refill takes less than 2 seconds.  Neurological:     General: No focal deficit present.     Mental Status: She is alert and oriented to person, place, and time.  Psychiatric:        Mood and Affect: Mood normal.        Behavior: Behavior normal.     Ortho Exam  Imaging: No results found.  Past Medical/Family/Surgical/Social History: Medications & Allergies reviewed per EMR, new medications updated. Patient Active Problem List   Diagnosis Date Noted   Encounter for screening mammogram for breast cancer 11/18/2023   Elevated liver enzymes 11/18/2023   New daily persistent headache 11/18/2023   Class 2 obesity due to excess calories with body mass index (BMI) of 35.0 to 35.9 in adult 11/08/2023   Encounter for annual health examination 03/22/2023   Benign hypertensive heart disease without heart failure 03/22/2023   Mild recurrent major depression (HCC) 03/22/2023   Class 2 obesity due to excess calories with body mass index (BMI) of 37.0 to 37.9 in adult 03/22/2023   Encounter for screening colonoscopy 03/22/2023   Vitamin D  deficiency 03/22/2023   Multiple acquired skin tags 03/22/2023   Aortic  atherosclerosis (HCC) 03/22/2023   S/P gastric bypass 03/22/2023   Hematochezia 01/18/2023   Morbid obesity with BMI of 40.0-44.9, adult (HCC) 09/04/2022   Chronic neck pain 09/04/2022   Coronary artery disease involving native coronary artery of native heart without angina pectoris 06/27/2022   Abnormal cardiac CT angiography 06/08/2022   Carpal tunnel syndrome on left 11/28/2021   Numbness and tingling in left hand 10/11/2021   Abdominal cramping 09/26/2021   Hormone replacement therapy 09/26/2021   Pelvic mass 04/11/2021   Teratoma, malignant (HCC) 09/21/2020   Premature atrial contractions 06/10/2020   PVC (premature ventricular contraction) 06/10/2020   Acanthosis nigricans, acquired 06/14/2018   Prediabetes 06/14/2018   GAD (generalized anxiety disorder) 08/30/2017   Hypoestrogenism 08/30/2017   Migraine without aura 08/10/2015   GERD without esophagitis 08/10/2015   Obesity due to excess calories with serious comorbidity 08/10/2015   Primary insomnia 08/10/2015   Spinal stenosis, lumbar 01/14/2015   Carpal  tunnel syndrome 12/12/2013   HYPERLIPIDEMIA 12/05/2007   Essential hypertension 12/05/2007   BRADYCARDIA 12/05/2007   Osteoarthritis 12/05/2007   Past Medical History:  Diagnosis Date   Anxiety    Cancer [facility])    ovarian cancer   Carpal tunnel syndrome of right wrist 11/21/2013   Coronary artery disease    Depression    Ganglion cyst of wrist 11/21/2013   right   Hypertension    is on 3 meds. - states BP is under control; has been on med. x 12 yr.   Migraines    Right tennis elbow 11/21/2013   Family History  Problem Relation Age of Onset   Hypertension Mother    Diabetes Mother    Cancer Mother    Hypertension Maternal Grandmother    Heart attack Paternal Grandmother    Diabetes Brother    Hypertension Brother    Diabetes Brother    Cancer Other    Breast cancer Neg Hx    Past Surgical History:  Procedure Laterality Date   ABDOMINAL HYSTERECTOMY      complete   CARDIAC CATHETERIZATION     CARPAL TUNNEL RELEASE Right 12/12/2013   Procedure: ENDOSCOPY RIGHT WRIST CARPAL TUNNEL RELEASE EXCISION GANGLION WRIST PRIMARY ;  Surgeon: Saundra Curl, MD;  Location: [facility];  Service: Orthopedics;  Laterality: Right;   CARPAL TUNNEL RELEASE Left 05/31/2022   Procedure: LEFT CARPAL TUNNEL RELEASE;  Surgeon: Wes Hamman, MD;  Location: [facility];  Service: Orthopedics;  Laterality: Left;   CESAREAN SECTION  03/11/2005; 04/05/2008   CHOLECYSTECTOMY  02/11/2004   lap. chole.   LATERAL EPICONDYLE RELEASE Right 12/12/2013   Procedure: RIGHT TENNIS ELBOW RELEASE WITH BONE REPAIR;  Surgeon: Saundra Curl, MD;  Location: [facility];  Service: Orthopedics;  Laterality: Right;   LUMBAR LAMINECTOMY WITH COFLEX 1 LEVEL Bilateral 01/14/2015   Procedure: Laminectomy - bilateral - L4-L5 ;  Surgeon: Tivis Forster, MD;  Location: [facility];  Service: Neurosurgery;  Laterality: Bilateral;  Laminectomy - bilateral - L4-L5    LUMBAR WOUND DEBRIDEMENT N/A 02/02/2015   Procedure: LUMBAR WOUND DEBRIDEMENT, WASHOUT, CLOSURE;  Surgeon: Augusto Blonder, MD;  Location: [facility];  Service: Neurosurgery;  Laterality: N/A;  lumbar wound debridement   TUBAL LIGATION  04/05/2008   Social History   Occupational History   Not on file  Tobacco Use   Smoking status: Never   Smokeless tobacco: Never  Vaping Use   Vaping status: Never Used  Substance and Sexual Activity   Alcohol use: No   Drug use: No   Sexual activity: Not on file

## 2023-11-27 NOTE — Progress Notes (Signed)
 Pain Scale   Average Pain 7 Patient advising that she has neck pain radiating to left shoulder. Patient advising she had an MRI ordered but it was denied.        +Driver, -BT, -Dye Allergies.

## 2023-11-30 ENCOUNTER — Encounter: Payer: Self-pay | Admitting: Orthopedic Surgery

## 2023-12-04 ENCOUNTER — Other Ambulatory Visit

## 2023-12-19 ENCOUNTER — Telehealth: Payer: Self-pay | Admitting: Physical Medicine and Rehabilitation

## 2023-12-19 NOTE — Telephone Encounter (Signed)
 Patient called and said she was denied for the MRI because it was some missing information from [facility]. [phone]

## 2024-01-09 ENCOUNTER — Telehealth: Payer: Self-pay

## 2024-01-09 NOTE — Telephone Encounter (Signed)
 The patient called and left a vm, checking to see if her MRI had been authorized yet. [employer] auth team has been working on it, but I looked the request up in the evolent portal and saw they were requesting clinicals --- I did this today and it is pending review of these. I called the patient back, leaving this info on her vm. Gave her my direct # so that she could call back if any questions on this.

## 2024-01-17 ENCOUNTER — Ambulatory Visit: Payer: Self-pay | Admitting: Internal Medicine

## 2024-01-17 VITALS — BP 120/80 | HR 78 | Temp 98.3°F | Ht 59.0 in | Wt 178.8 lb

## 2024-01-17 DIAGNOSIS — R519 Headache, unspecified: Secondary | ICD-10-CM | POA: Diagnosis not present

## 2024-01-17 DIAGNOSIS — L299 Pruritus, unspecified: Secondary | ICD-10-CM

## 2024-01-17 DIAGNOSIS — I119 Hypertensive heart disease without heart failure: Secondary | ICD-10-CM | POA: Diagnosis not present

## 2024-01-17 DIAGNOSIS — E66812 Obesity, class 2: Secondary | ICD-10-CM

## 2024-01-17 DIAGNOSIS — R1084 Generalized abdominal pain: Secondary | ICD-10-CM

## 2024-01-17 DIAGNOSIS — M5412 Radiculopathy, cervical region: Secondary | ICD-10-CM

## 2024-01-17 DIAGNOSIS — Z6836 Body mass index (BMI) 36.0-36.9, adult: Secondary | ICD-10-CM

## 2024-01-17 DIAGNOSIS — I2510 Atherosclerotic heart disease of native coronary artery without angina pectoris: Secondary | ICD-10-CM

## 2024-01-17 DIAGNOSIS — D649 Anemia, unspecified: Secondary | ICD-10-CM

## 2024-01-17 LAB — POCT URINALYSIS DIP (CLINITEK)
Bilirubin, UA: NEGATIVE
Glucose, UA: NEGATIVE mg/dL
Ketones, POC UA: NEGATIVE mg/dL
Leukocytes, UA: NEGATIVE
Nitrite, UA: NEGATIVE — NL
POC PROTEIN,UA: NEGATIVE
Spec Grav, UA: >=1.03 — AB (ref 1.010–1.025)
Urobilinogen, UA: 0.2 U/dL
pH, UA: 6 (ref 5.0–8.0)

## 2024-01-17 MED ORDER — CYCLOBENZAPRINE HCL 5 MG PO TABS
5.0000 mg | ORAL_TABLET | Freq: Three times a day (TID) | ORAL | 0 refills | Status: AC | PRN
Start: 2024-01-17 — End: ?

## 2024-01-17 NOTE — Patient Instructions (Addendum)
 Stevia  Monkfruit   Pruritus Pruritus is an itchy feeling on the skin. One of the most common causes is dry skin, but many different things can cause itching. Most cases of itching do not require medical attention. Sometimes itchy skin can turn into a rash or a secondary infection. Follow these instructions at home: Skin care  Do not use scented soaps, detergents, perfumes, and cosmetic products. Instead, use gentle, unscented versions of these items. Apply moisturizing creams to your skin frequently, at least twice daily. Apply immediately after bathing while skin is still wet. Take medicines or apply medicated creams only as told by your health care provider. This may include: Corticosteroid cream or topical calcineurin inhibitor. Anti-itch lotions containing urea, camphor, or menthol . Oral antihistamines. Do not take hot showers or baths, which can make itching worse. A short, cool shower may help with itching as long as you apply moisturizing lotion after the shower. Apply a cool, wet cloth (cool compress) to the affected areas. You may take lukewarm baths with one of the following: Epsom salts. You can get these at your local pharmacy or grocery store. Follow the instructions on the packaging. Baking soda. Pour a small amount into the bath as told by your health care provider. Colloidal oatmeal. You can get this at your local pharmacy or grocery store. Follow the instructions on the packaging. Do not scratch your skin. General instructions Avoid wearing tight clothes. Keep a journal to help find out what is causing your itching. Write down: What you eat and drink. What cosmetic products you use. What soaps or detergents you use. What you wear, including jewelry. Use a humidifier. This keeps the air moist, which helps to prevent dry skin. Be aware of any changes in your itchiness. Tell your health care provider about any changes. Contact a health care provider if: The itching does  not go away after several days. You notice redness, warmth, or drainage on the skin where you have scratched. You are unusually thirsty or urinating more than normal. Your skin tingles or feels numb. Your skin or the white parts of your eyes turn yellow (jaundice). You feel weak. You have any of the following: Night sweats. Tiredness (fatigue). Weight loss. Abdominal pain. Summary Pruritus is an itchy feeling on the skin. One of the most common causes is dry skin, but many different conditions and factors can cause itching. Apply moisturizing creams to your skin frequently, at least twice daily. Apply immediately after bathing while skin is still wet. Take medicines or apply medicated creams only as told by your health care provider. Do not take hot showers or baths. Do not use scented soaps, detergents, perfumes, or cosmetic products. Keep a journal to help find out what is causing your itching. This information is not intended to replace advice given to you by your health care provider. Make sure you discuss any questions you have with your health care provider. Document Revised: 08/17/2021 Document Reviewed: 08/17/2021 Elsevier Patient Education  2024 [employer].

## 2024-01-17 NOTE — Progress Notes (Signed)
 I,[name], CMA,acting as a Neurosurgeon for Emily LOISE Slocumb, MD.,have documented all relevant documentation on the behalf of Emily LOISE Slocumb, MD,as directed by  Emily LOISE Slocumb, MD while in the presence of Emily LOISE Slocumb, MD.  Subjective:  Patient ID: [name] , female    DOB: October 22, 1975 , 48 y.o.   MRN: [id number]  Chief Complaint  Patient presents with   Dizziness    Patient presents today for dizziness & headache this has been ongoing for 3 weeks. Associated with neck pain. Denies chest pain & sob. Admits blurred vision. She has visit the ophthalmologist, everything came back fine.  She reports taking Amlodipine  in the morning. She did take medication this morning. She states the neck pain hurts more when she lies down.  She admits not drinking water regularly.    Headache    HPI Discussed the use of AI scribe software for clinical note transcription with the patient, who gave verbal consent to proceed.  History of Present Illness [name] is a 48 year old female with hypertension and coronary artery disease who presents with dizziness and headache.  She experiences dizziness and headaches, with a recent home blood pressure reading of 160/93 mmHg. Her headaches differ from her past migraines, and she is not taking Excedrin due to her gastric bypass surgery. She also has neck pain radiating to her shoulders.   She is awaiting imaging for her neck pain, delayed due to insurance issues.  She underwent gastric bypass surgery in June of the previous year, which led to significant complications, including internal bleeding requiring a blood transfusion and resulting in anemia. She was on Xarelto prior to the surgery due to a coronary artery stent placement. Post-surgery, she experienced bleeding during bowel movements and fainted due to blood loss, leading to further transfusions and treatment for anemia. She continues to experience daily stomach pain, which persists despite  an endoscopy that removed a staple left from the surgery. She takes Protonix  twice a day for her stomach.  She has a history of kidney stones, having passed one earlier this year. She followed up with a urologist, who reported normal findings despite abnormal lab results. She experiences pain in her abdomen, particularly in the upper right quadrant, which worsens with deep breaths and sometimes improves after bowel movements. A CT scan of her abdomen in April identified a kidney stone.  She reports significant dehydration issues, which she attributes to stomach pain when drinking water. She has tried various methods to increase fluid intake, including flavored water and lemonade, but continues to struggle with hydration. She has been receiving IV hydration treatments to manage this issue.  She experiences severe itching all over her body, which becomes painful when scratched. She has tried Benadryl  and other medications for relief, but they primarily induce sleepiness without resolving the itching. She has been referred to an allergist for further evaluation, with an appointment scheduled for next month.  She takes amlodipine  for hypertension and Robaxin  as a muscle relaxer. She lives with her husband and children and works at a [employer], primarily on the computer. No tingling in arms, but she reports pain radiating to shoulders, mostly on the right. She experiences blurred vision, but an eye doctor found no issues. No increased urination. She reports daily bowel movements and severe itching with a burning sensation upon scratching.    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 - states BP is under control; has been on med. x 12 yr.   Migraines    Right tennis elbow 11/21/2013      Family History  Problem Relation Age of Onset   Hypertension Mother    Diabetes Mother    Cancer Mother    Hypertension Maternal Grandmother    Heart attack Paternal Grandmother    Diabetes Brother    Hypertension Brother    Diabetes Brother    Cancer Other    Breast cancer Neg Hx      Current Outpatient Medications:    amLODipine  (NORVASC ) 10 MG tablet, Take 1 tablet (10 mg total) by mouth daily., Disp: 90 tablet, Rfl: 0   atorvastatin  (LIPITOR) 40 MG tablet, Take 1 tablet (40 mg total) by mouth daily., Disp: 90 tablet, Rfl: 1   cyclobenzaprine  (FLEXERIL ) 5 MG tablet, Take 1 tablet (5 mg total) by mouth 3 (three) times daily as needed for muscle spasms., Disp: 30 tablet, Rfl: 0   methocarbamol  (ROBAXIN -750) 750 MG tablet, Take 1 tablet (750 mg total) by mouth 2 (two) times daily as needed for muscle spasms., Disp: 20 tablet, Rfl: 0   ondansetron  (ZOFRAN -ODT) 4 MG disintegrating tablet, Take 1 tablet (4 mg total) by mouth every 8 (eight) hours as needed for nausea or vomiting., Disp: 8 tablet, Rfl: 0   oxyCODONE -acetaminophen  (PERCOCET/ROXICET) 5-325 MG tablet, Take 1-2 tablets by mouth every 8 (eight) hours as needed., Disp: 8 tablet, Rfl: 0   pantoprazole  (PROTONIX ) 40 MG tablet, Take 1 tablet (40 mg total) by mouth daily. (Patient taking differently: Take 40 mg by mouth 2 (two) times daily.), Disp: 90 tablet, Rfl: 1   Vitamin D , Ergocalciferol , (DRISDOL ) 1.25 MG (50000 UNIT) CAPS capsule, Take 1 capsule (50,000 Units total) by mouth every 7 (seven) days., Disp: 12 capsule, Rfl: 1   Allergies  Allergen Reactions   Venlafaxine Anaphylaxis and Nausea Only    Insomnia; Depression Insomnia; Depression      Review of Systems  Constitutional: Negative.   Respiratory: Negative.    Cardiovascular: Negative.   Gastrointestinal: Negative.   Neurological:  Positive for dizziness and headaches.  Psychiatric/Behavioral: Negative.       Today's Vitals   01/17/24 1427 01/17/24 1431  01/17/24 1435  BP: 124/80 (!) 140/90 120/80  Pulse: 69 70 78  Temp: 98.3 F (36.8 C)    SpO2: 98%    Weight: 178 lb 12.8 oz (81.1 kg)    Height: 4' 11 (1.499 m)     Body mass index is 36.11 kg/m.  Wt Readings from Last 3 Encounters:  01/17/24 178 lb 12.8 oz (81.1 kg)  11/08/23 175 lb (79.4 kg)  08/15/23 170 lb (77.1 kg)     Objective:  Physical Exam Vitals and nursing note reviewed.  Constitutional:      Appearance: Normal appearance.  HENT:     Head: Normocephalic and atraumatic.  Eyes:     Extraocular Movements: Extraocular movements intact.  Cardiovascular:     Rate and Rhythm: Normal rate and regular rhythm.     Heart sounds: Normal heart sounds.  Pulmonary:     Effort: Pulmonary effort is normal.     Breath sounds: Normal breath sounds.  Abdominal:     Palpations: Abdomen  is soft.     Tenderness: There is abdominal tenderness.  Skin:    General: Skin is warm.  Neurological:     General: No focal deficit present.     Mental Status: She is alert.  Psychiatric:        Mood and Affect: Mood normal.        Behavior: Behavior normal.       Assessment And Plan:  Nonintractable episodic headache, unspecified headache type Assessment & Plan: Intermittent. Encouraged to continue following her home blood pressure readings. Likely related to hypertension and dehydration. Headaches may be tension-related or due to elevated blood pressure. Dizziness may be exacerbated by inadequate fluid intake. - Monitor blood pressure daily at home. - Increase fluid intake. - Avoid high-sodium foods. - Recheck blood count and kidney function. - Consider starting lisinopril or ARB after reviewing kidney function. - Possibly related to elevated blood pressure - Has some features of tension headache - Consider magnesium  supplementation  Orders: -     CBC  Cervical radiculopathy Assessment & Plan: Radiating to shoulders, predominantly right side. Previous treatments ineffective.  MRI denied by insurance; awaiting review. May contribute to tension headaches. Discussed potential benefits of muscle relaxers and non-pharmacological interventions. - Prescribe a different muscle relaxer to be taken at night. - Apply Vicks to bilateral shoulders nightly - Await insurance decision on MRI; intervene if necessary after 14 days.   Hypertensive heart disease without heart failure Assessment & Plan: Chronic, controlled. Managed with amlodipine . Management complicated by dehydration. Discussed risks of dehydration exacerbating hypertension and potential benefits of adding lisinopril or ARB therapy - Monitor blood pressure daily at home. - Increase fluid intake. - Consider adding lisinopril or ARB after reviewing kidney function. - Continue with amlodipine  10mg  daily for now.   Orders: -     BMP8+EGFR -     POCT URINALYSIS DIP (CLINITEK)  Coronary artery disease involving native coronary artery of native heart without angina pectoris Assessment & Plan: Chronic, LDL goal is less than 70. She will continue with atorvastatin  40mg  daily.  - Encouraged to follow heart healthy lifestyle.    Generalized abdominal pain Assessment & Plan: Daily pain, particularly in the upper abdomen. Previous endoscopy revealed a retained staple, now removed. Kidney stones may contribute. Dehydration may exacerbate symptoms. Discussed increasing fluid intake to prevent kidney stones and manage pain. - Increase fluid intake. - Recheck blood count and kidney function. - Provide urine sample for analysis. - [facility] results reviewed in detail   Pruritus Assessment & Plan: Generalized itching with burning sensation. Allergist referral made. - Recheck liver function. - Proceed with allergist appointment.  Orders: -     ANA, IFA (with reflex) -     Hepatic function panel  Anemia, unspecified type Assessment & Plan: Following gastric bypass surgery and complications. Recent lab work  needed to assess current status. - Recheck blood count.   Class 2 severe obesity due to excess calories with serious comorbidity and body mass index (BMI) of 36.0 to 36.9 in adult [facility]) Assessment & Plan: BMI 36. She is s/p gastric bypass, followed at [facility]. Encouraged to optimize her protein intake.    Other orders -     Cyclobenzaprine  HCl; Take 1 tablet (5 mg total) by mouth 3 (three) times daily as needed for muscle spasms.  Dispense: 30 tablet; Refill: 0 -     FANA Staining Patterns   Return if symptoms worsen or fail to improve.  Patient was given  opportunity to ask questions. Patient verbalized understanding of the plan and was able to repeat key elements of the plan. All questions were answered to their satisfaction.   I, Emily LOISE Slocumb, MD, have reviewed all documentation for this visit. The documentation on 01/25/24 for the exam, diagnosis, procedures, and orders are all accurate and complete.   IF YOU HAVE BEEN REFERRED TO A SPECIALIST, IT MAY TAKE 1-2 WEEKS TO SCHEDULE/PROCESS THE REFERRAL. IF YOU HAVE NOT HEARD FROM US /SPECIALIST IN TWO WEEKS, PLEASE GIVE US  A CALL AT [phone] X [phone].   THE PATIENT IS ENCOURAGED TO PRACTICE SOCIAL DISTANCING DUE TO THE COVID-19 PANDEMIC.

## 2024-01-22 ENCOUNTER — Ambulatory Visit: Payer: Self-pay | Admitting: Internal Medicine

## 2024-01-22 LAB — BMP8+EGFR
BUN/Creatinine Ratio: 33 — ABNORMAL HIGH (ref 9–23)
BUN: 18 mg/dL (ref 6–24)
CO2: 19 mmol/L — ABNORMAL LOW (ref 20–29)
Calcium: 9.2 mg/dL (ref 8.7–10.2)
Chloride: 105 mmol/L (ref 96–106)
Creatinine, Ser: 0.55 mg/dL — ABNORMAL LOW (ref 0.57–1.00)
Glucose: 82 mg/dL (ref 70–99)
Potassium: 4 mmol/L (ref 3.5–5.2)
Sodium: 141 mmol/L (ref 134–144)
eGFR: 114 mL/min/{1.73_m2} (ref >59)

## 2024-01-22 LAB — CBC
Hematocrit: 42.9 % (ref 34.0–46.6)
Hemoglobin: 13.3 g/dL (ref 11.1–15.9)
MCH: 26.1 pg — ABNORMAL LOW (ref 26.6–33.0)
MCHC: 31 g/dL — ABNORMAL LOW (ref 31.5–35.7)
MCV: 84 fL (ref 79–97)
Platelets: 226 10*3/uL (ref 150–450)
RBC: 5.09 x10E6/uL (ref 3.77–5.28)
RDW: 13.4 % (ref 11.7–15.4)
WBC: 7.6 10*3/uL (ref 3.4–10.8)

## 2024-01-22 LAB — HEPATIC FUNCTION PANEL
ALT: 21 IU/L (ref 0–32)
AST: 25 IU/L (ref 0–40)
Albumin: 4.8 g/dL (ref 3.9–4.9)
Alkaline Phosphatase: 144 IU/L — ABNORMAL HIGH (ref 44–121)
Bilirubin Total: 0.3 mg/dL (ref 0.0–1.2)
Bilirubin, Direct: 0.12 mg/dL (ref 0.00–0.40)
Total Protein: 7.7 g/dL (ref 6.0–8.5)

## 2024-01-22 LAB — FANA STAINING PATTERNS: Speckled Pattern: 1:160 {titer} — ABNORMAL HIGH

## 2024-01-22 LAB — ANTINUCLEAR ANTIBODIES, IFA: ANA Titer 1: POSITIVE — AB

## 2024-01-25 DIAGNOSIS — D649 Anemia, unspecified: Secondary | ICD-10-CM | POA: Insufficient documentation

## 2024-01-25 DIAGNOSIS — M5412 Radiculopathy, cervical region: Secondary | ICD-10-CM | POA: Insufficient documentation

## 2024-01-25 DIAGNOSIS — L299 Pruritus, unspecified: Secondary | ICD-10-CM | POA: Insufficient documentation

## 2024-01-25 DIAGNOSIS — R519 Headache, unspecified: Secondary | ICD-10-CM | POA: Insufficient documentation

## 2024-01-25 DIAGNOSIS — R1084 Generalized abdominal pain: Secondary | ICD-10-CM | POA: Insufficient documentation

## 2024-01-25 NOTE — Assessment & Plan Note (Signed)
Chronic, LDL goal is less than 70.  She will continue with atorvastatin 40mg  daily. Encouraged to follow heart healthy lifestyle.

## 2024-01-25 NOTE — Assessment & Plan Note (Addendum)
 Radiating to shoulders, predominantly right side. Previous treatments ineffective. MRI denied by insurance; awaiting review. May contribute to tension headaches. Discussed potential benefits of muscle relaxers and non-pharmacological interventions. - Prescribe a different muscle relaxer to be taken at night. - Apply Vicks to bilateral shoulders nightly - Await insurance decision on MRI; intervene if necessary after 14 days.

## 2024-01-25 NOTE — Assessment & Plan Note (Signed)
 Daily pain, particularly in the upper abdomen. Previous endoscopy revealed a retained staple, now removed. Kidney stones may contribute. Dehydration may exacerbate symptoms. Discussed increasing fluid intake to prevent kidney stones and manage pain. - Increase fluid intake. - Recheck blood count and kidney function. - Provide urine sample for analysis. - [facility] CT results reviewed in detail

## 2024-01-25 NOTE — Assessment & Plan Note (Addendum)
 Intermittent. Encouraged to continue following her home blood pressure readings. Likely related to hypertension and dehydration. Headaches may be tension-related or due to elevated blood pressure. Dizziness may be exacerbated by inadequate fluid intake. - Monitor blood pressure daily at home. - Increase fluid intake. - Avoid high-sodium foods. - Recheck blood count and kidney function. - Consider starting lisinopril or ARB after reviewing kidney function. - Possibly related to elevated blood pressure - Has some features of tension headache - Consider magnesium  supplementation

## 2024-01-25 NOTE — Assessment & Plan Note (Signed)
 BMI 36. She is s/p gastric bypass, followed at [facility]. Encouraged to optimize her protein intake.

## 2024-01-25 NOTE — Assessment & Plan Note (Signed)
 Following gastric bypass surgery and complications. Recent lab work needed to assess current status. - Recheck blood count.

## 2024-01-25 NOTE — Assessment & Plan Note (Addendum)
 Chronic, controlled. Managed with amlodipine . Management complicated by dehydration. Discussed risks of dehydration exacerbating hypertension and potential benefits of adding lisinopril or ARB therapy - Monitor blood pressure daily at home. - Increase fluid intake. - Consider adding lisinopril or ARB after reviewing kidney function. - Continue with amlodipine  10mg  daily for now.

## 2024-01-25 NOTE — Assessment & Plan Note (Signed)
 Generalized itching with burning sensation. Allergist referral made. - Recheck liver function. - Proceed with allergist appointment.

## 2024-02-13 ENCOUNTER — Other Ambulatory Visit: Payer: Self-pay | Admitting: Internal Medicine

## 2024-02-13 ENCOUNTER — Other Ambulatory Visit: Payer: Self-pay

## 2024-02-13 DIAGNOSIS — M5412 Radiculopathy, cervical region: Secondary | ICD-10-CM

## 2024-02-13 DIAGNOSIS — R768 Other specified abnormal immunological findings in serum: Secondary | ICD-10-CM

## 2024-02-13 MED ORDER — AMLODIPINE BESYLATE 10 MG PO TABS: 10.0000 mg | ORAL_TABLET | Freq: Every day | ORAL | 0 refills | Status: DC

## 2024-02-15 ENCOUNTER — Encounter: Payer: Self-pay | Admitting: Allergy

## 2024-02-15 ENCOUNTER — Other Ambulatory Visit: Payer: Self-pay

## 2024-02-15 ENCOUNTER — Ambulatory Visit (INDEPENDENT_AMBULATORY_CARE_PROVIDER_SITE_OTHER): Admitting: Allergy

## 2024-02-15 VITALS — BP 128/106 | HR 63 | Temp 98.0°F | Resp 16 | Ht 59.45 in | Wt 174.9 lb

## 2024-02-15 DIAGNOSIS — L509 Urticaria, unspecified: Secondary | ICD-10-CM | POA: Diagnosis not present

## 2024-02-15 DIAGNOSIS — L299 Pruritus, unspecified: Secondary | ICD-10-CM | POA: Diagnosis not present

## 2024-02-15 MED ORDER — FAMOTIDINE 20 MG PO TABS
ORAL_TABLET | ORAL | 5 refills | Status: DC
Start: 2024-02-15 — End: 2024-03-23

## 2024-02-15 MED ORDER — LEVOCETIRIZINE DIHYDROCHLORIDE 5 MG PO TABS
ORAL_TABLET | ORAL | 5 refills | Status: DC
Start: 2024-02-15 — End: 2024-03-23

## 2024-02-15 NOTE — Progress Notes (Signed)
 What that would do with those that brought to the hospital this morning this morning Emily   New Patient [name]  RE: [name] MRN: [id number] DOB: [birth year] Date of Office Visit: 02/15/2024  Primary care provider: Georgina Speaks, FNP  Chief Complaint: itching  History of present illness: [name] is a 48 y.o. female presenting today for evaluation of pruritus.  Discussed the use of AI scribe software for clinical [name] transcription with the patient, who gave verbal consent to proceed.  She has been experiencing persistent itching that began a few months after undergoing weight loss surgery in June of the previous year. The itching is generalized, affecting the stomach, back, and arms, and occurs most days in various environments, including home and work.  She has attempted several treatments to alleviate the itching, including Atarax (hydroxyzine) taken at night due to its sedative effects, as well as Benadryl  spray, cortisone cream, antibacterial soap, and showers. Despite these efforts, the itching persists, leading to scratches, red marks, and scabs from intense scratching. No associated rash is noted, but the marks from scratching can become raised.  There are no identified environmental or seasonal factors exacerbating the itching, nor does physical activity worsen the symptoms. No swelling of the lips, eyes, ears, fingers, or toes. She has not had any preceding illnesses such as colds or sinus infections. She received a COVID vaccination but does not recall it being close to the onset of symptoms.  She has traveled out of the country recently but did not travel before the itching began. She denies any changes in detergent or body products prior to the onset of symptoms. She was given a steroid course at urgent care to help with the itching, particularly after experiencing mosquito bites, which she describes as a 'different itch.'  She recalls that an autoimmune screening  test (ANA) was performed and that her doctor mentioned a rheumatology referral was recommended, but she has not yet heard back about the referral. She has no known history of thyroid issues.     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 - states BP is under control; has been on med. x 12 yr.   Migraines    Right tennis elbow 11/21/2013    Past surgical history: Past Surgical History:  Procedure Laterality Date   ABDOMINAL HYSTERECTOMY     complete   CARDIAC CATHETERIZATION     CARPAL TUNNEL RELEASE Right 12/12/2013   Procedure: ENDOSCOPY RIGHT WRIST CARPAL TUNNEL RELEASE EXCISION GANGLION WRIST PRIMARY ;  Surgeon: Evalene JONETTA Chancy, MD;  Location: [facility];  Service: Orthopedics;  Laterality: Right;   CARPAL TUNNEL RELEASE Left 05/31/2022   Procedure: LEFT CARPAL TUNNEL RELEASE;  Surgeon: Jerri Kay HERO, MD;  Location: [facility];  Service: Orthopedics;  Laterality: Left;   CESAREAN SECTION  03/11/2005; 04/05/2008   CHOLECYSTECTOMY  02/11/2004   lap. chole.   LATERAL EPICONDYLE RELEASE Right 12/12/2013   Procedure: RIGHT TENNIS ELBOW RELEASE WITH BONE REPAIR;  Surgeon: Evalene JONETTA Chancy, MD;  Location: [facility];  Service: Orthopedics;  Laterality: Right;  LUMBAR LAMINECTOMY WITH COFLEX 1 LEVEL Bilateral 01/14/2015   Procedure: Laminectomy - bilateral - L4-L5 ;  Surgeon: Darina Boehringer, MD;  Location: [facility];  Service: Neurosurgery;  Laterality: Bilateral;  Laminectomy - bilateral - L4-L5    LUMBAR WOUND DEBRIDEMENT N/A 02/02/2015   Procedure: LUMBAR WOUND DEBRIDEMENT, WASHOUT, CLOSURE;  Surgeon: Gerldine Maizes, MD;  Location: [facility];  Service: Neurosurgery;  Laterality: N/A;  lumbar wound debridement   TUBAL LIGATION  04/05/2008    Family history:  Family History  Problem Relation Age of Onset   Hypertension Mother    Diabetes Mother    Cancer Mother    Asthma Father    Diabetes Brother    Hypertension Brother    Diabetes Brother    Hypertension Maternal Grandmother    Heart attack Paternal Grandmother    Cancer Other    Breast cancer Neg Hx     Social history: Lives in a home without carpeting with electric heating and central cooling.  2 dogs and 1 cat in the home.  No concern for water damage, mildew or roaches in the home.  She works Psychologist, sport and exercise.  Denies a smoking history.    Medication List: Current Outpatient Medications  Medication Sig Dispense Refill   amLODipine  (NORVASC ) 10 MG tablet Take 1 tablet (10 mg total) by mouth daily. 90 tablet 0   atorvastatin  (LIPITOR) 40 MG tablet Take 1 tablet (40 mg total) by mouth daily. 90 tablet 1   cyclobenzaprine  (FLEXERIL ) 5 MG tablet Take 1 tablet (5 mg total) by mouth 3 (three) times daily as needed for muscle spasms. 30 tablet 0   ondansetron  (ZOFRAN -ODT) 4 MG disintegrating tablet Take 1 tablet (4 mg total) by mouth every 8 (eight) hours as needed for nausea or vomiting. 8 tablet 0   pantoprazole  (PROTONIX ) 40 MG tablet Take 1 tablet (40 mg total) by mouth daily. 90 tablet 1   Vitamin D , Ergocalciferol , (DRISDOL ) 1.25 MG (50000 UNIT) CAPS capsule Take 1 capsule (50,000 Units total) by mouth every 7 (seven) days. 12 capsule 1   methocarbamol  (ROBAXIN -750) 750 MG tablet Take 1 tablet (750 mg total) by mouth 2 (two) times daily as needed for muscle spasms. (Patient not taking: Reported on 02/15/2024) 20 tablet 0   oxyCODONE -acetaminophen  (PERCOCET/ROXICET) 5-325 MG tablet Take 1-2 tablets by mouth every 8 (eight) hours as needed. (Patient not taking: Reported on 02/15/2024) 8 tablet 0   No current facility-administered medications for this visit.    Known  medication allergies: Allergies  Allergen Reactions   Venlafaxine Anaphylaxis and Nausea Only    Insomnia; Depression Insomnia; Depression      Physical examination: Blood pressure (!) 128/106, pulse 63, temperature 98 F (36.7 C), temperature source Temporal, resp. rate 16, height 4' 11.45 (1.51 m), weight 174 lb 14.4 oz (79.3 kg), last menstrual period 09/17/2010, SpO2 97%.  General: Alert, interactive, in no acute distress. HEENT: PERRLA, TMs pearly gray, turbinates non-edematous without discharge, post-pharynx non erythematous. Neck: Supple without lymphadenopathy. Lungs: Clear to auscultation without wheezing, rhonchi or rales. {no increased work of breathing. CV: Normal S1, S2 without murmurs. Abdomen: Nondistended, nontender. Skin: Many erythematous papules on arms b/l. Extremities:  No clubbing, cyanosis or edema. Neuro:   Grossly intact.  Diagnostics/Labs: Labs: Component     Latest Ref Rng 01/17/2024  Glucose     70 - 99 mg/dL 82   BUN     6 - 24 mg/dL 18   Creatinine  0.57 - 1.00 mg/dL 9.44 (L)   eGFR     >40 mL/min/1.73 114   BUN/Creatinine Ratio     9 - 23  33 (H)   Sodium     134 - 144 mmol/L 141   Potassium     3.5 - 5.2 mmol/L 4.0   Chloride     96 - 106 mmol/L 105   CO2     20 - 29 mmol/L 19 (L)   Calcium      8.7 - 10.2 mg/dL 9.2   WBC     3.4 - 89.1 x10E3/uL 7.6   RBC     3.77 - 5.28 x10E6/uL 5.09   Hemoglobin     11.1 - 15.9 g/dL 86.6   HCT     65.9 - 53.3 % 42.9   MCV     79 - 97 fL 84   MCH     26.6 - 33.0 pg 26.1 (L)   MCHC     31.5 - 35.7 g/dL 68.9 (L)   RDW     88.2 - 15.4 % 13.4   Platelets     150 - 450 x10E3/uL 226   Total Protein     6.0 - 8.5 g/dL 7.7   Albumin     3.9 - 4.9 g/dL 4.8   Total Bilirubin     0.0 - 1.2 mg/dL 0.3   BILIRUBIN, DIRECT     0.00 - 0.40 mg/dL 9.87   Alkaline Phosphatase     44 - 121 IU/L 144 (H)   AST     0 - 40 IU/L 25   ALT     0 - 32 IU/L 21   Speckled Pattern 1:160 (H)   [name]:  Comment   ANA Titer 1 Positive !      Assessment and plan: Chronic itching with hiving due to scratching Chronic itching post-bariatric surgery with no rash except after scratching.   Labs normal except slightly elevated liver enzymes and positive ANA, suggesting possible autoimmune etiology.  Agree with referral for rheumatology evaluation.  - Will perform allergy testing via bloodwork with environmental allergy panel, alpha gal panel, milk IgE, thyroid studies, tryptase level.  - Prescribe Xyzal 5mg  1 tab daily WITH Pepcid 20mg  1 tab daily.   If needed for more control can increase both medications to twice a day dosing.   Xyzal is a primary antihistamine and Pepcid is a secondary antihistamine.   - Reserve Atarax for itching interfering with sleep  Follow-up in 3-4 months or sooner if needed  I appreciate the opportunity to take part in Anjali's care. Please do not hesitate to contact me with questions.  Sincerely,   Danita Brain, MD Allergy/Immunology [facility]

## 2024-02-15 NOTE — Patient Instructions (Addendum)
 Chronic itching with hiving due to scratching Chronic itching post-bariatric surgery with no rash except after scratching.   Labs normal except slightly elevated liver enzymes and positive ANA, suggesting possible autoimmune etiology.  Agree with referral for rheumatology evaluation.  - Will perform allergy testing via bloodwork with environmental allergy panel, alpha gal panel, milk IgE, thyroid studies, tryptase level.  - Prescribe Xyzal 5mg  1 tab daily WITH Pepcid 20mg  1 tab daily.   If needed for more control can increase both medications to twice a day dosing.   Xyzal is a primary antihistamine and Pepcid is a secondary antihistamine.   - Reserve Atarax for itching interfering with sleep  Follow-up in 3-4 months or sooner if needed

## 2024-02-18 LAB — ALLERGEN MILK: Milk IgE: 0.9 kU/L — AB

## 2024-02-18 LAB — ALPHA-GAL PANEL
Allergen Lamb IgE: <0.1 kU/L
Beef IgE: <0.1 kU/L
IgE (Immunoglobulin E), Serum: 1357 [IU]/mL — AB (ref 6–495)
O215-IgE Alpha-Gal: <0.1 kU/L
Pork IgE: <0.1 kU/L

## 2024-02-18 LAB — ALLERGENS W/TOTAL IGE AREA 2
Alternaria Alternata IgE: <0.1 kU/L
Aspergillus Fumigatus IgE: <0.1 kU/L
Bermuda Grass IgE: <0.1 kU/L
Cat Dander IgE: 85.9 kU/L — AB
Cedar, Mountain IgE: <0.1 kU/L
Cladosporium Herbarum IgE: <0.1 kU/L
Cockroach, German IgE: 0.33 kU/L — AB
Common Silver Birch IgE: <0.1 kU/L
Cottonwood IgE: <0.1 kU/L
D Farinae IgE: 9.22 kU/L — AB
D Pteronyssinus IgE: 8.51 kU/L — AB
Dog Dander IgE: 8.43 kU/L — AB
Elm, American IgE: <0.1 kU/L
Johnson Grass IgE: 0.32 kU/L — AB
Maple/Box Elder IgE: 0.48 kU/L — AB
Mouse Urine IgE: <0.1 kU/L
Oak, White IgE: <0.1 kU/L
Pecan, Hickory IgE: 0.19 kU/L — AB
Penicillium Chrysogen IgE: <0.1 kU/L
Pigweed, Rough IgE: <0.1 kU/L
Ragweed, Short IgE: <0.1 kU/L
Sheep Sorrel IgE Qn: <0.1 kU/L
Timothy Grass IgE: 0.56 kU/L — AB
White Mulberry IgE: <0.1 kU/L

## 2024-02-18 LAB — TSH: TSH: 1.67 u[IU]/mL (ref 0.450–4.500)

## 2024-02-18 LAB — T4, FREE: Free T4: 0.98 ng/dL (ref 0.82–1.77)

## 2024-02-18 LAB — TRYPTASE: Tryptase: 4.1 ug/L (ref 2.2–13.2)

## 2024-02-20 ENCOUNTER — Ambulatory Visit: Payer: Self-pay | Admitting: Allergy

## 2024-03-16 NOTE — Progress Notes (Unsigned)
 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 )   History of Present Illness: [name] is a 48 y.o. female positive ANA.  Patient reports that she is not really sure why they checked this test. She reports that she did tell her doctor she is not feeling very well, but that it was very non-specific. She reports to neck and shoulder pain, as well as pruritus. She admits that her neck pain has been going on for approximately 2 years, appears stable. She states that her shoulders have been causing issues for approximately 6 months. She denies pain any certain time of day, no specific instigating or relieving factors. She admits to worsening of the pain throughout the day with activity, denies any stiffness. The pruritus has been going on for approximately one year. She does admit to some of these symptoms prior to her gastric bypass surgery, which was over a year ago.  She admits that for the past week, she has noticed that the itching has been worse with exposure to the cold weather. She feels like there is almost a hive that develops. She also notices that her hands also start to itch when exposed to the cold air in the car. She admits to noticing her fingers will also swell and itch when exposed to cold weather. She reports that is resolves quickly after she warms herself up. She denies resulting scar.  She denies oral ulcers. She denies photosensitivity. She denies dry eyes, dry mouth. She denies any additional rashes other than what is mentioned above. She denies fever. She denies unexplainable weight loss. She denies raynaud's symptoms. She denies hx of blood clot or stroke. She denies hx of miscarriages. She admits to hx  of pre-eclampsia for all three of her pregnancies.  She admits to joint pain in mostly upper extremities (elbows, sometimes wrists/hands), and ankles. She denies any swelling. She admits to AM stiffness in these joints lasting a couple of minutes. She denies chest pain or shortness of breath.  She denies personal or family hx of autoimmune disease. She denies any nicotine use. She admits to occasional alcohol use. She denies IV drug use. She admits to hx of blood transfusion (last year during gastric bypass).  She denies travel outside of the country or illness prior to onset of symptoms.   Activities of Daily Living:  Patient reports morning stiffness for 5-10 minutes.   Patient Reports nocturnal pain.  Difficulty dressing/grooming: Reports Difficulty climbing stairs: Reports Difficulty getting out of chair: Denies Difficulty using hands for taps, buttons, cutlery, and/or writing: Denies  Review of Systems  Constitutional:  Positive for fatigue.  HENT:  Negative for mouth sores and mouth dryness.   Eyes:  Negative for dryness.  Respiratory:  Negative for shortness of breath.   Cardiovascular:  Negative for chest pain and palpitations.  Gastrointestinal:  Negative for blood in stool, constipation and diarrhea.  Endocrine: Negative for increased urination.  Genitourinary:  Negative for involuntary urination.  Musculoskeletal:  Positive for joint pain, joint pain, joint swelling, myalgias, muscle weakness, morning stiffness, muscle tenderness and myalgias. Negative  for gait problem.  Skin:  Positive for hair loss. Negative for color change, rash and sensitivity to sunlight.  Allergic/Immunologic: Negative for susceptible to infections.  Neurological:  Positive for dizziness and headaches.  Hematological:  Negative for swollen glands.  Psychiatric/Behavioral:  Positive for depressed mood. Negative for sleep disturbance. The patient is nervous/anxious.      Rheum History: #N/A  PMFS  History:  Patient Active Problem List   Diagnosis Date Noted   Nonintractable episodic headache 01/25/2024   Cervical radiculopathy 01/25/2024   Generalized abdominal pain 01/25/2024   Pruritus 01/25/2024   Anemia 01/25/2024   Encounter for screening mammogram for breast cancer 11/18/2023   Elevated liver enzymes 11/18/2023   New daily persistent headache 11/18/2023   Class 2 obesity due to excess calories with body mass index (BMI) of 35.0 to 35.9 in adult 11/08/2023   Encounter for annual health examination 03/22/2023   Hypertensive heart disease without heart failure 03/22/2023   Mild recurrent major depression (HCC) 03/22/2023   Class 2 obesity due to excess calories with body mass index (BMI) of 37.0 to 37.9 in adult 03/22/2023   Encounter for screening colonoscopy 03/22/2023   Vitamin D  deficiency 03/22/2023   Multiple acquired skin tags 03/22/2023   Aortic atherosclerosis (HCC) 03/22/2023   S/P gastric bypass 03/22/2023   Hematochezia 01/18/2023   Morbid obesity with BMI of 40.0-44.9, adult (HCC) 09/04/2022   Chronic neck pain 09/04/2022   Coronary artery disease involving native coronary artery of native heart without angina pectoris 06/27/2022   Abnormal cardiac CT angiography 06/08/2022   Carpal tunnel syndrome on left 11/28/2021   Numbness and tingling in left hand 10/11/2021   Abdominal cramping 09/26/2021   Hormone replacement therapy 09/26/2021   Pelvic mass 04/11/2021   Teratoma, malignant (HCC) 09/21/2020   Premature atrial contractions 06/10/2020   PVC (premature ventricular contraction) 06/10/2020   Acanthosis nigricans, acquired 06/14/2018   Prediabetes 06/14/2018   GAD (generalized anxiety disorder) 08/30/2017   Hypoestrogenism 08/30/2017   Migraine without aura 08/10/2015   GERD without esophagitis 08/10/2015   Obesity due to excess calories with serious comorbidity 08/10/2015   Primary insomnia 08/10/2015   Spinal stenosis, lumbar 01/14/2015   Carpal  tunnel syndrome 12/12/2013   HYPERLIPIDEMIA 12/05/2007   Essential hypertension 12/05/2007   BRADYCARDIA 12/05/2007   Osteoarthritis 12/05/2007    Past Medical History:  Diagnosis Date   Anxiety    Cancer (HCC)    ovarian cancer   Carpal tunnel syndrome of right wrist 11/21/2013   Coronary artery disease    Depression    Ganglion cyst of wrist 11/21/2013   right   Hypertension    is on 3 meds. - states BP is under control; has been on med. x 12 yr.   Migraines    Right tennis elbow 11/21/2013    Family History  Problem Relation Age of Onset   Hypertension Mother    Diabetes Mother    Cancer Mother    Asthma Father    Anemia Sister    Diabetes Brother    Hypertension Brother    Diabetes Brother    Hypertension Maternal Grandmother    Heart attack Paternal Grandmother    Cancer Other    Breast cancer Neg Hx    Past Surgical History:  Procedure Laterality Date   ABDOMINAL HYSTERECTOMY     complete   CARDIAC CATHETERIZATION     CARPAL TUNNEL RELEASE Right 12/12/2013   Procedure: ENDOSCOPY RIGHT WRIST CARPAL TUNNEL RELEASE  EXCISION GANGLION WRIST PRIMARY ;  Surgeon: Evalene JONETTA Chancy, MD;  Location: [facility];  Service: Orthopedics;  Laterality: Right;   CARPAL TUNNEL RELEASE Left 05/31/2022   Procedure: LEFT CARPAL TUNNEL RELEASE;  Surgeon: Jerri Kay HERO, MD;  Location: [facility];  Service: Orthopedics;  Laterality: Left;   CESAREAN SECTION  03/11/2005; 04/05/2008   CHOLECYSTECTOMY  02/11/2004   lap. chole.   GASTRIC BYPASS     LATERAL EPICONDYLE RELEASE Right 12/12/2013   Procedure: RIGHT TENNIS ELBOW RELEASE WITH BONE REPAIR;  Surgeon: Evalene JONETTA Chancy, MD;  Location:  SURGERY CENTER;  Service: Orthopedics;  Laterality: Right;   LUMBAR LAMINECTOMY WITH COFLEX 1 LEVEL Bilateral 01/14/2015   Procedure: Laminectomy - bilateral - L4-L5 ;  Surgeon: Darina Boehringer, MD;  Location: [facility];  Service: Neurosurgery;  Laterality:  Bilateral;  Laminectomy - bilateral - L4-L5    LUMBAR WOUND DEBRIDEMENT N/A 02/02/2015   Procedure: LUMBAR WOUND DEBRIDEMENT, WASHOUT, CLOSURE;  Surgeon: Gerldine Maizes, MD;  Location: [facility];  Service: Neurosurgery;  Laterality: N/A;  lumbar wound debridement   TUBAL LIGATION  04/05/2008   Social History   Social History Narrative   Not on file   Immunization History  Administered Date(s) Administered   Influenza,inj,Quad PF,6+ Mos 04/11/2021, 05/03/2022   Influenza-Unspecified 03/29/2012   PFIZER(Purple Top)SARS-COV-2 Vaccination 08/12/2019, 08/18/2019, 09/02/2019, 09/08/2019, 03/30/2020, 05/24/2021   Pneumococcal Conjugate-13 05/14/2018   Pneumococcal Polysaccharide-23 07/31/2019   Tdap 03/29/2012, 05/03/2022     Objective: Vital Signs: BP (!) 139/91 (BP Location: Left Arm, Patient Position: Sitting, Cuff Size: Small)   Pulse 60   Resp 14   Ht 4' 11 (1.499 m)   Wt 177 lb 9.6 oz (80.6 kg)   LMP 09/17/2010   BMI 35.87 kg/m    Physical Exam Vitals and nursing note reviewed.  Constitutional:      Appearance: She is well-developed.  HENT:     Head: Normocephalic and atraumatic.  Eyes:     Conjunctiva/sclera: Conjunctivae normal.  Cardiovascular:     Rate and Rhythm: Normal rate and regular rhythm.     Heart sounds: Normal heart sounds.  Pulmonary:     Effort: Pulmonary effort is normal.     Breath sounds: Normal breath sounds.  Abdominal:     General: Bowel sounds are normal.     Palpations: Abdomen is soft.  Musculoskeletal:     Cervical back: Normal range of motion.     Comments: Nodule on flexor tendon sheath of left 2nd finger, non-tender  Skin:    General: Skin is warm and dry.     Capillary Refill: Capillary refill takes less than 2 seconds.  Neurological:     Mental Status: She is alert and oriented to person, place, and time.  Psychiatric:        Behavior: Behavior normal.     Musculoskeletal Exam:    CDAI Exam: CDAI Score: -- Patient  Global: --; Provider Global: -- Swollen: 0 ; Tender: 0  Joint Exam 03/17/2024   All documented joints were normal     Investigation: No additional findings.  Imaging: No results found.  Recent Labs: Lab Results  Component Value Date   WBC 7.6 01/17/2024   HGB 13.3 01/17/2024   PLT 226 01/17/2024   NA 141 01/17/2024   K 4.0 01/17/2024   CL 105 01/17/2024   CO2 19 (L) 01/17/2024   GLUCOSE 82 01/17/2024   BUN 18 01/17/2024   CREATININE 0.55 (  L) 01/17/2024   BILITOT 0.3 01/17/2024   ALKPHOS 144 (H) 01/17/2024   AST 25 01/17/2024   ALT 21 01/17/2024   PROT 7.7 01/17/2024   ALBUMIN 4.8 01/17/2024   CALCIUM  9.2 01/17/2024   GFRAA >60 02/02/2015   QFTBGOLDPLUS Positive (A) 05/03/2022    Speciality Comments: No specialty comments available.  Procedures:  No procedures performed Allergies: Venlafaxine   Assessment / Plan:     Visit Diagnoses:  Positive ANA (antinuclear antibody) At this time, patient with no evidence of inflammatory arthritis or other compelling features for clinically active systemic autoimmune disorder serving as a unifying diagnosis for patient's overall presentation. Patient does not have any features of SLE (no objective malar rash, inflammatory arthritis, Raynaud's, alopecia, recurrent cytopenias), rheumatoid arthritis (no evidence of synovitis), Sjogren's disease (no sicca symptoms), scleroderma (no sclerodactyly, no Raynaud's)  As discussed with patient, a positive ANA need not represent presence of clinically active systemic autoimmune disease.  Can be positive in the normal population, autoimmune thyroid disease (Graves' disease, Hashimoto's thyroiditis etc.), or infection. ANA can be positive in the healthy population at the following rates: ANA 1:40: 20%-30%, ANA 1:80: 10%-15%, ANA 1:160: 5%, ANA 1:320: 3% positive, healthy relative of an SLE patient: 5%-25% positive (usually low titers), and elderly (age >70 years): up to 70% positive at ANA titer  1:40.   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 Patient with hx of QuantiFERON-TB Gold test in October 2023, however I do not see any follow-up and patient denies any treatment. Patient reports being told it was a false positive. Will repeat testing today, if positive, will refer to ID for latent TB treatment.  Pruritus  Patient with hx of pruritus, unclear etiology at this time. Given hx of blood transfusion, will rule out any possibility of liver disease as etiology of this manifestation, however very low suspicion given normal liver function tests. Will obtain Hepatitis B core antibody, IgM, Hepatitis B surface antigen, Hepatitis C antibody. Also advised patient to follow-up with allergy physician to see what options are available for treatment of allergies that returned positive on recent testing.  Degenerative disc disease; cervical Patient with hx of degenerative disc disease or cervical and lumbar spine. Suspect patient's current neck pain is secondary to previously identified disc disease. Advised patient to reach out to orthopedics to determine if additional treatment options are available. Discussed with patient that typically would utilize NSAIDS, however patient unable to take due to hx of gastric bypass. Advised patient to use tylenol  PRN or lidocaine  patches to see if this provides any relief.   Elevated blood pressure reading Patient with elevated blood pressure reading at office today, with hx of hypertension on multiple anti-hypertensive medications. Patient admits to appointment with primary care provider today. Advised importance of keeping her appointment given elevated blood pressure, patient verbalizes understanding.   Orders: Orders Placed This Encounter  Procedures   Hepatitis B core antibody, IgM   Hepatitis B surface antigen   Hepatitis C antibody   QuantiFERON-TB Gold Plus   No orders of the defined types were placed in this encounter.   Face-to-face time  spent with patient was 60 minutes. Greater than 50% of time was spent in counseling and coordination of care.  Follow-Up Instructions: Return if symptoms worsen or fail to improve.   Asberry Claw, DO

## 2024-03-17 ENCOUNTER — Encounter: Payer: Self-pay | Admitting: Nurse Practitioner

## 2024-03-17 ENCOUNTER — Ambulatory Visit

## 2024-03-17 ENCOUNTER — Ambulatory Visit: Payer: Medicaid Other | Admitting: Nurse Practitioner

## 2024-03-17 VITALS — BP 159/102 | HR 58 | Resp 14 | Ht 59.0 in | Wt 177.6 lb

## 2024-03-17 VITALS — BP 128/78 | HR 89 | Temp 98.2°F | Ht 59.0 in | Wt 180.6 lb

## 2024-03-17 DIAGNOSIS — M159 Polyosteoarthritis, unspecified: Secondary | ICD-10-CM | POA: Diagnosis not present

## 2024-03-17 DIAGNOSIS — R7303 Prediabetes: Secondary | ICD-10-CM | POA: Diagnosis not present

## 2024-03-17 DIAGNOSIS — I119 Hypertensive heart disease without heart failure: Secondary | ICD-10-CM | POA: Diagnosis not present

## 2024-03-17 DIAGNOSIS — I10 Essential (primary) hypertension: Secondary | ICD-10-CM

## 2024-03-17 DIAGNOSIS — F5101 Primary insomnia: Secondary | ICD-10-CM

## 2024-03-17 DIAGNOSIS — R768 Other specified abnormal immunological findings in serum: Secondary | ICD-10-CM

## 2024-03-17 DIAGNOSIS — Z0000 Encounter for general adult medical examination without abnormal findings: Secondary | ICD-10-CM

## 2024-03-17 DIAGNOSIS — E559 Vitamin D deficiency, unspecified: Secondary | ICD-10-CM | POA: Diagnosis not present

## 2024-03-17 DIAGNOSIS — L299 Pruritus, unspecified: Secondary | ICD-10-CM

## 2024-03-17 DIAGNOSIS — R7612 Nonspecific reaction to cell mediated immunity measurement of gamma interferon antigen response without active tuberculosis: Secondary | ICD-10-CM

## 2024-03-17 DIAGNOSIS — Z6836 Body mass index (BMI) 36.0-36.9, adult: Secondary | ICD-10-CM

## 2024-03-17 DIAGNOSIS — R202 Paresthesia of skin: Secondary | ICD-10-CM

## 2024-03-17 DIAGNOSIS — E66812 Obesity, class 2: Secondary | ICD-10-CM

## 2024-03-17 DIAGNOSIS — M47812 Spondylosis without myelopathy or radiculopathy, cervical region: Secondary | ICD-10-CM

## 2024-03-17 DIAGNOSIS — Z139 Encounter for screening, unspecified: Secondary | ICD-10-CM

## 2024-03-17 DIAGNOSIS — Z889 Allergy status to unspecified drugs, medicaments and biological substances status: Secondary | ICD-10-CM

## 2024-03-17 DIAGNOSIS — Z79899 Other long term (current) drug therapy: Secondary | ICD-10-CM

## 2024-03-17 DIAGNOSIS — E785 Hyperlipidemia, unspecified: Secondary | ICD-10-CM

## 2024-03-17 LAB — POCT URINALYSIS DIP (CLINITEK)
Bilirubin, UA: NEGATIVE
Blood, UA: NEGATIVE
Glucose, UA: NEGATIVE mg/dL
Leukocytes, UA: NEGATIVE
Nitrite, UA: NEGATIVE
POC PROTEIN,UA: NEGATIVE
Spec Grav, UA: 1.015 (ref 1.010–1.025)
Urobilinogen, UA: 0.2 U/dL
pH, UA: 6 (ref 5.0–8.0)

## 2024-03-17 MED ORDER — ATORVASTATIN CALCIUM 40 MG PO TABS: 40.0000 mg | ORAL_TABLET | Freq: Every day | ORAL | 1 refills | Status: AC

## 2024-03-17 MED ORDER — EPINEPHRINE 0.3 MG/0.3ML IJ SOAJ: 0.3000 mg | INTRAMUSCULAR | 2 refills | Status: AC | PRN

## 2024-03-17 MED ORDER — VITAMIN D (ERGOCALCIFEROL) 1.25 MG (50000 UNIT) PO CAPS
50000.0000 [IU] | ORAL_CAPSULE | ORAL | 1 refills | Status: DC
Start: 2024-03-17 — End: 2024-03-23

## 2024-03-17 MED ORDER — PANTOPRAZOLE SODIUM 40 MG PO TBEC
40.0000 mg | DELAYED_RELEASE_TABLET | Freq: Every day | ORAL | 1 refills | Status: AC
Start: 2024-03-17 — End: 2025-03-17

## 2024-03-17 NOTE — Progress Notes (Signed)
 [name], CMA,acting as a Neurosurgeon for Emily Ada, FNP.,have documented all relevant documentation on the behalf of Emily Ada, FNP,as directed by  Emily Ada, FNP while in the presence of Emily Ada, FNP.  Subjective:    Patient ID: [name] , female    DOB: [birth year] , 48 y.o.   MRN: [id number]  Chief Complaint  Patient presents with   Annual Exam    Patient presents today for HM, Patient reports compliance with medication. Patient denies any chest pain, SOB, or headaches. Patient is concerned about her fingertips being cold and swelling when its cold.     HPI  She is here for her HM. She has also seen Rheumatology today, she reports they told her to come back as needed. CT abdomen in January 2025 shows post hysterectomy      Past Medical History:  Diagnosis Date   Anemia 12/26/2022   Anxiety    Arthritis    Blood transfusion without reported diagnosis 12/26/2022   Cancer [name])    ovarian cancer   Carpal tunnel syndrome of right wrist 11/21/2013   Coronary artery disease    Depression    Ganglion cyst of wrist 11/21/2013   right   Hypertension    is on 3 meds. - states BP is under control; has been on med. x 12 yr.   Migraines    Right tennis elbow 11/21/2013     Family History  Problem Relation Age of Onset   Hypertension Mother    Diabetes Mother    Cancer Mother    Arthritis Mother    Asthma Father    Anemia Sister    Diabetes Brother    Hypertension Brother    Diabetes Brother    Hypertension Maternal Grandmother    Heart attack Paternal Grandmother    Cancer Other    Heart disease Maternal Uncle    Breast cancer Neg Hx      Current Outpatient Medications:    amLODipine  (NORVASC ) 10 MG tablet, Take 1 tablet (10 mg total) by mouth daily., Disp: 90 tablet, Rfl: 0   cyclobenzaprine  (FLEXERIL ) 5 MG tablet, Take 1 tablet (5 mg total) by mouth 3 (three) times daily as needed for muscle spasms., Disp: 30 tablet, Rfl: 0   EPINEPHrine  0.3  mg/0.3 mL IJ SOAJ injection, Inject 0.3 mg into the muscle as needed for anaphylaxis., Disp: 1 each, Rfl: 2   atorvastatin  (LIPITOR) 40 MG tablet, Take 1 tablet (40 mg total) by mouth daily., Disp: 90 tablet, Rfl: 1   hydrOXYzine (ATARAX) 10 MG tablet, 1-2 tabs by mouth every 4-6 hours as needed for itching, Disp: , Rfl:    pantoprazole  (PROTONIX ) 40 MG tablet, Take 1 tablet (40 mg total) by mouth daily., Disp: 90 tablet, Rfl: 1   Vitamin D , Ergocalciferol , (DRISDOL ) 1.25 MG (50000 UNIT) CAPS capsule, Take 1 capsule (50,000 Units total) by mouth every 7 (seven) days., Disp: 12 capsule, Rfl: 1   Allergies  Allergen Reactions   Venlafaxine Anaphylaxis and Nausea Only    Insomnia; Depression Insomnia; Depression       The patient states she uses status post hysterectomy for birth control. Patient's last menstrual period was 09/17/2010.. Negative for Dysmenorrhea and Negative for Menorrhagia. Negative for: breast discharge, breast lump(s), breast pain and breast self exam. Associated symptoms include abnormal vaginal bleeding. Pertinent negatives include abnormal bleeding (hematology), anxiety, decreased libido, depression, difficulty falling sleep, dyspareunia, history of infertility, nocturia, sexual dysfunction, sleep disturbances, urinary incontinence,  urinary urgency, vaginal discharge and vaginal itching. Diet Bariatric diet. She continues to be followed by [name]. The patient states her exercise level is minimal with 2 days a week.   The patient's tobacco use is:  Social History   Tobacco Use  Smoking Status Never   Passive exposure: Current  Smokeless Tobacco Never  . She has been exposed to passive smoke. The patient's alcohol use is:  Social History   Substance and Sexual Activity  Alcohol Use No  Additional information: Last pap she feels last year [facility], next one scheduled for a year from her last appt, she does not feel like she has one set up right now..     Review of Systems  Constitutional: Negative.   HENT: Negative.    Eyes: Negative.   Respiratory: Negative.    Cardiovascular: Negative.   Gastrointestinal: Negative.   Endocrine: Negative.   Genitourinary: Negative.   Musculoskeletal: Negative.   Skin: Negative.   Allergic/Immunologic: Negative.   Neurological: Negative.   Hematological: Negative.   Psychiatric/Behavioral: Negative.       Today's Vitals   03/17/24 1455  BP: 128/78  Pulse: 89  Temp: 98.2 F (36.8 C)  TempSrc: Oral  Weight: 180 lb 9.6 oz (81.9 kg)  Height: 4' 11 (1.499 m)  PainSc: 7   PainLoc: Head   Body mass index is 36.48 kg/m.  Wt Readings from Last 3 Encounters:  03/20/24 179 lb (81.2 kg)  03/17/24 180 lb 9.6 oz (81.9 kg)  03/17/24 177 lb 9.6 oz (80.6 kg)     Objective:  Physical Exam Vitals and nursing note reviewed.  Constitutional:      General: She is not in acute distress.    Appearance: Normal appearance. She is well-developed. She is obese.  HENT:     Head: Normocephalic and atraumatic.     Right Ear: Hearing, tympanic membrane, ear canal and external ear normal. There is no impacted cerumen.     Left Ear: Hearing, tympanic membrane, ear canal and external ear normal. There is no impacted cerumen.     Nose: Nose normal.     Mouth/Throat:     Mouth: Mucous membranes are moist.  Eyes:     General: Lids are normal.     Extraocular Movements: Extraocular movements intact.     Conjunctiva/sclera: Conjunctivae normal.     Pupils: Pupils are equal, round, and reactive to light.     Funduscopic exam:    Right eye: No papilledema.        Left eye: No papilledema.  Neck:     Thyroid: No thyroid mass.     Vascular: No carotid bruit.  Cardiovascular:     Rate and Rhythm: Normal rate and regular rhythm.     Pulses: Normal pulses.     Heart sounds: Normal heart sounds. No murmur heard. Pulmonary:     Effort: Pulmonary effort is normal. No respiratory distress.     Breath sounds:  Normal breath sounds. No wheezing.  Chest:     Chest wall: No mass.  Breasts:    Tanner Score is 5.     Right: Normal. No mass or tenderness.     Left: Normal. No mass or tenderness.  Abdominal:     General: Abdomen is flat. Bowel sounds are normal. There is no distension.     Palpations: Abdomen is soft.     Tenderness: There is no abdominal tenderness.  Musculoskeletal:  General: No swelling. Normal range of motion.     Cervical back: Full passive range of motion without pain, normal range of motion and neck supple.     Right lower leg: No edema.     Left lower leg: No edema.  Lymphadenopathy:     Upper Body:     Right upper body: No supraclavicular, axillary or pectoral adenopathy.     Left upper body: No supraclavicular, axillary or pectoral adenopathy.  Skin:    General: Skin is warm and dry.     Capillary Refill: Capillary refill takes less than 2 seconds.     Comments: Multiple skin tags to neck area   Neurological:     General: No focal deficit present.     Mental Status: She is alert and oriented to person, place, and time.     Cranial Nerves: No cranial nerve deficit.     Sensory: No sensory deficit.     Motor: No weakness.  Psychiatric:        Mood and Affect: Mood normal.        Behavior: Behavior normal.        Thought Content: Thought content normal.        Judgment: Judgment normal.      Assessment And Plan:     Encounter for annual health examination Assessment & Plan: Behavior modifications discussed and diet history reviewed.   Pt will continue to exercise regularly and modify diet with low GI, plant based foods and decrease intake of processed foods.  Recommend intake of daily multivitamin, Vitamin D , and calcium .  Recommend mammogram and colonoscopy for preventive screenings, as well as recommend immunizations that include influenza, TDAP    Hypertensive heart disease without heart failure Assessment & Plan: Blood pressure is  well-controlled.  Continue follow-up with cardiology.  Orders: -     EKG 12-Lead -     Microalbumin / creatinine urine ratio -     POCT URINALYSIS DIP (CLINITEK)  Prediabetes Assessment & Plan: Hemoglobin A1c was slightly increased at last visit.  No current medications.  Orders: -     Hemoglobin A1c  Vitamin D  deficiency Assessment & Plan: Will check vitamin D  level and supplement as needed.    Also encouraged to spend 15 minutes in the sun daily.    Orders: -     VITAMIN D  25 Hydroxy (Vit-D Deficiency, Fractures) -     Vitamin D  (Ergocalciferol ); Take 1 capsule (50,000 Units total) by mouth every 7 (seven) days.  Dispense: 12 capsule; Refill: 1  Primary insomnia Assessment & Plan: Continue current medications.   Class 2 severe obesity due to excess calories with serious comorbidity and body mass index (BMI) of 36.0 to 36.9 in adult [facility]) Assessment & Plan: She is encouraged to strive for BMI less than 30 to decrease cardiac risk. Advised to aim for at least 150 minutes of exercise per week. Continue f/u with bariatric clinic.    Other long term (current) drug therapy  Hyperlipidemia, unspecified hyperlipidemia type Assessment & Plan: Will check lipid panel.   Orders: -     Lipid panel  Encounter for screening  Multiple allergies Assessment & Plan: Rx sent for epi pen aware if has to use needs to go to ER for evaluation  Orders: -     EPINEPHrine ; Inject 0.3 mg into the muscle as needed for anaphylaxis.  Dispense: 1 each; Refill: 2  Tingling sensation Assessment & Plan: Will check vitamin B12.   Orders: -  Vitamin B12  Other orders -     Pantoprazole  Sodium; Take 1 tablet (40 mg total) by mouth daily.  Dispense: 90 tablet; Refill: 1 -     Atorvastatin  Calcium ; Take 1 tablet (40 mg total) by mouth daily.  Dispense: 90 tablet; Refill: 1    Return for 1 year physical, 6 month bp check. Patient was given opportunity to ask questions. Patient  verbalized understanding of the plan and was able to repeat key elements of the plan. All questions were answered to their satisfaction.   Emily Ada, FNP  I, Emily Ada, FNP, have reviewed all documentation for this visit. The documentation on 03/17/24 for the exam, diagnosis, procedures, and orders are all accurate and complete.

## 2024-03-18 LAB — LIPID PANEL
Chol/HDL Ratio: 3.2 ratio (ref 0.0–4.4)
Cholesterol, Total: 165 mg/dL (ref 100–199)
HDL: 52 mg/dL (ref >39)
LDL Chol Calc (NIH): 78 mg/dL (ref 0–99)
Triglycerides: 213 mg/dL — ABNORMAL HIGH (ref 0–149)
VLDL Cholesterol Cal: 35 mg/dL (ref 5–40)

## 2024-03-18 LAB — HEMOGLOBIN A1C
Est. average glucose Bld gHb Est-mCnc: 108 mg/dL
Hgb A1c MFr Bld: 5.4 % (ref 4.8–5.6)

## 2024-03-18 LAB — VITAMIN B12: Vitamin B-12: 1878 pg/mL — ABNORMAL HIGH (ref 232–1245)

## 2024-03-18 LAB — MICROALBUMIN / CREATININE URINE RATIO
Creatinine, Urine: 157 mg/dL
Microalb/Creat Ratio: 12 mg/g{creat} (ref 0–29)
Microalbumin, Urine: 18.8 ug/mL

## 2024-03-18 LAB — VITAMIN D 25 HYDROXY (VIT D DEFICIENCY, FRACTURES): Vit D, 25-Hydroxy: 15.9 ng/mL — ABNORMAL LOW (ref 30.0–100.0)

## 2024-03-20 ENCOUNTER — Encounter: Payer: Self-pay | Admitting: Plastic Surgery

## 2024-03-20 ENCOUNTER — Ambulatory Visit: Payer: Self-pay

## 2024-03-20 ENCOUNTER — Ambulatory Visit: Admitting: Plastic Surgery

## 2024-03-20 VITALS — BP 144/90 | HR 74 | Ht 59.0 in | Wt 179.0 lb

## 2024-03-20 DIAGNOSIS — R7611 Nonspecific reaction to tuberculin skin test without active tuberculosis: Secondary | ICD-10-CM

## 2024-03-20 DIAGNOSIS — E65 Localized adiposity: Secondary | ICD-10-CM | POA: Diagnosis not present

## 2024-03-20 DIAGNOSIS — M793 Panniculitis, unspecified: Secondary | ICD-10-CM | POA: Diagnosis not present

## 2024-03-20 DIAGNOSIS — L987 Excessive and redundant skin and subcutaneous tissue: Secondary | ICD-10-CM

## 2024-03-20 DIAGNOSIS — Z9884 Bariatric surgery status: Secondary | ICD-10-CM

## 2024-03-20 DIAGNOSIS — R21 Rash and other nonspecific skin eruption: Secondary | ICD-10-CM | POA: Diagnosis not present

## 2024-03-20 LAB — QUANTIFERON-TB GOLD PLUS
Mitogen-NIL: 6.33 [IU]/mL
NIL: 0.03 [IU]/mL
QuantiFERON-TB Gold Plus: POSITIVE — AB
TB1-NIL: 0.49 [IU]/mL
TB2-NIL: 0.45 [IU]/mL

## 2024-03-20 LAB — HEPATITIS B SURFACE ANTIGEN: Hepatitis B Surface Ag: NONREACTIVE

## 2024-03-20 LAB — HEPATITIS B CORE ANTIBODY, IGM: Hep B C IgM: NONREACTIVE

## 2024-03-20 LAB — HEPATITIS C ANTIBODY: Hepatitis C Ab: NONREACTIVE

## 2024-03-20 NOTE — Progress Notes (Signed)
 Referring Provider Georgina Speaks, FNP [address] [place],  [place] [address]   CC: No chief complaint on file.     [name] is an 48 y.o. female.  HPI: Ms. [name] is a very pleasant 48 year old female well-known to me.  She underwent bariatric surgery a little over a year ago.  She has had a 9 pound weight loss.  She now has excess skin and fat on the anterior abdominal wall.  Tissue often rubs against the skin over the symphysis pubis and on the upper thighs.  She has rashes from this irritation from sweating.  She also finds it difficult to find clothes which fit appropriately.  The excess tissue can make it difficult for cleaning after toileting.  She is interested in removal of the skin and fat.  Allergies  Allergen Reactions   Venlafaxine Anaphylaxis and Nausea Only    Insomnia; Depression Insomnia; Depression     Outpatient Encounter Medications as of 03/20/2024  Medication Sig   amLODipine  (NORVASC ) 10 MG tablet Take 1 tablet (10 mg total) by mouth daily.   atorvastatin  (LIPITOR) 40 MG tablet Take 1 tablet (40 mg total) by mouth daily.   cyclobenzaprine  (FLEXERIL ) 5 MG tablet Take 1 tablet (5 mg total) by mouth 3 (three) times daily as needed for muscle spasms.   EPINEPHrine  0.3 mg/0.3 mL IJ SOAJ injection Inject 0.3 mg into the muscle as needed for anaphylaxis.   famotidine  (PEPCID ) 20 MG tablet 1 tab daily with Zyrtec.  Increase to 1 tab in AM and 1 tab in PM if itching persist on daily dose. (Patient not taking: Reported on 03/17/2024)   hydrOXYzine (ATARAX) 10 MG tablet 1-2 tabs by mouth every 4-6 hours as needed for itching   levocetirizine (XYZAL ) 5 MG tablet Take 1 tab daily.  Increase to 1 tab in AM and 1 tab in PM if itching persist on daily dose (Patient not taking: Reported on 03/17/2024)   methocarbamol  (ROBAXIN -750) 750 MG tablet Take 1 tablet (750 mg total) by mouth 2 (two) times daily as needed for muscle spasms. (Patient not taking: Reported  on 03/17/2024)   ondansetron  (ZOFRAN -ODT) 4 MG disintegrating tablet Take 1 tablet (4 mg total) by mouth every 8 (eight) hours as needed for nausea or vomiting. (Patient not taking: Reported on 03/17/2024)   oxyCODONE -acetaminophen  (PERCOCET/ROXICET) 5-325 MG tablet Take 1-2 tablets by mouth every 8 (eight) hours as needed. (Patient not taking: Reported on 03/17/2024)   pantoprazole  (PROTONIX ) 40 MG tablet Take 1 tablet (40 mg total) by mouth daily.   Vitamin D , Ergocalciferol , (DRISDOL ) 1.25 MG (50000 UNIT) CAPS capsule Take 1 capsule (50,000 Units total) by mouth every 7 (seven) days.   No facility-administered encounter medications on file as of 03/20/2024.     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 - states BP is under control; has been on med. x 12 yr.   Migraines    Right tennis elbow 11/21/2013    Past Surgical History:  Procedure Laterality Date   ABDOMINAL HYSTERECTOMY     complete   CARDIAC CATHETERIZATION  CARPAL TUNNEL RELEASE Right 12/12/2013   Procedure: ENDOSCOPY RIGHT WRIST CARPAL TUNNEL RELEASE EXCISION GANGLION WRIST PRIMARY ;  Surgeon: Evalene JONETTA Chancy, MD;  Location: [facility];  Service: Orthopedics;  Laterality: Right;   CARPAL TUNNEL RELEASE Left 05/31/2022   Procedure: LEFT CARPAL TUNNEL RELEASE;  Surgeon: Jerri Kay HERO, MD;  Location: [facility];  Service: Orthopedics;  Laterality: Left;   CESAREAN SECTION  03/11/2005; 04/05/2008   CHOLECYSTECTOMY  02/11/2004   lap. chole.   GASTRIC BYPASS     LATERAL EPICONDYLE RELEASE Right 12/12/2013   Procedure: RIGHT TENNIS ELBOW RELEASE WITH BONE REPAIR;  Surgeon: Evalene JONETTA Chancy, MD;   Location: [facility];  Service: Orthopedics;  Laterality: Right;   LUMBAR LAMINECTOMY WITH COFLEX 1 LEVEL Bilateral 01/14/2015   Procedure: Laminectomy - bilateral - L4-L5 ;  Surgeon: Darina Boehringer, MD;  Location: [facility];  Service: Neurosurgery;  Laterality: Bilateral;  Laminectomy - bilateral - L4-L5    LUMBAR WOUND DEBRIDEMENT N/A 02/02/2015   Procedure: LUMBAR WOUND DEBRIDEMENT, WASHOUT, CLOSURE;  Surgeon: Gerldine Maizes, MD;  Location: [facility];  Service: Neurosurgery;  Laterality: N/A;  lumbar wound debridement   TUBAL LIGATION  04/05/2008    Family History  Problem Relation Age of Onset   Hypertension Mother    Diabetes Mother    Cancer Mother    Arthritis Mother    Asthma Father    Anemia Sister    Diabetes Brother    Hypertension Brother    Diabetes Brother    Hypertension Maternal Grandmother    Heart attack Paternal Grandmother    Cancer Other    Heart disease Maternal Uncle    Breast cancer Neg Hx     Social History   Social History Narrative   Not on file     Review of Systems General: Denies fevers, chills, weight loss CV: Denies chest pain, shortness of breath, palpitations Abdomen: Excess skin of fat on the lower abdominal wall which is frequently affected with rashes and interferes with daily activities  Physical Exam    03/20/2024    1:14 PM 03/17/2024    2:55 PM 03/17/2024    9:57 AM  Vitals with BMI  Height 4' 11 4' 11   Weight 179 lbs 180 lbs 10 oz   BMI 36.13 36.46   Systolic 144 128 840  Diastolic 90 78 102  Pulse 74 89 58    General:  No acute distress,  Alert and oriented, Non-Toxic, Normal speech and affect Abdomen: Patient has excess skin and fat on the anterior abdominal wall which hangs well past the symphysis pubis and the inguinal creases onto the upper thigh.  There are no rashes today but she does have some discoloration of the skin consistent with previous rashes.  This is documented in her  photographs. Mammogram: May 2025 BI-RADS 1 Assessment/Plan Panniculitis: Patient has a large heavy apron of skin on the lower anterior abdominal wall.  I believe that she would benefit from removal of this skin and fat.  I showed her the location of the incisions and what she can expect to have  removed.  We discussed the risks and benefits including the risks of bleeding, infection, and seroma formation.  She understands that she will have drains postoperatively and these may be in place for 2 to 4 weeks.  We discussed the much higher risk of minor complications such as wound separation and wound infections associated with panniculitis and  panniculectomy's.  We discussed the need for compression for 6 weeks postoperatively.  We discussed the postoperative limitations including no heavy lifting greater than 20 pounds, no vigorous activity, and no submerging incisions in water for 6 weeks.  She may return to light activity as tolerated and may return to work when she feels comfortable.  She will be encouraged to walk immediately after surgery to help decrease the risk of DVT.  All questions were answered to her satisfaction.  Photographs were obtained today with her consent.  Will submit her for a panniculectomy at her request.  Leonce KATHEE Birmingham 03/20/2024, 2:33 PM

## 2024-03-23 ENCOUNTER — Ambulatory Visit: Payer: Self-pay | Admitting: Nurse Practitioner

## 2024-03-23 DIAGNOSIS — Z889 Allergy status to unspecified drugs, medicaments and biological substances status: Secondary | ICD-10-CM | POA: Insufficient documentation

## 2024-03-23 DIAGNOSIS — E559 Vitamin D deficiency, unspecified: Secondary | ICD-10-CM

## 2024-03-23 DIAGNOSIS — R202 Paresthesia of skin: Secondary | ICD-10-CM | POA: Insufficient documentation

## 2024-03-23 DIAGNOSIS — E785 Hyperlipidemia, unspecified: Secondary | ICD-10-CM | POA: Insufficient documentation

## 2024-03-23 MED ORDER — VITAMIN D (ERGOCALCIFEROL) 1.25 MG (50000 UNIT) PO CAPS: 50000.0000 [IU] | ORAL_CAPSULE | ORAL | 1 refills | Status: AC

## 2024-03-23 NOTE — Assessment & Plan Note (Signed)
Blood pressure is well-controlled.  Continue follow-up with cardiology.

## 2024-03-23 NOTE — Assessment & Plan Note (Signed)
 Rx sent for epi pen aware if has to use needs to go to ER for evaluation

## 2024-03-23 NOTE — Assessment & Plan Note (Signed)
Hemoglobin A1c was slightly increased at last visit.  No current medications.

## 2024-03-23 NOTE — Assessment & Plan Note (Signed)
 She is encouraged to strive for BMI less than 30 to decrease cardiac risk. Advised to aim for at least 150 minutes of exercise per week. Continue f/u with bariatric clinic.

## 2024-03-23 NOTE — Assessment & Plan Note (Signed)
 Continue current medications.

## 2024-03-23 NOTE — Assessment & Plan Note (Signed)
Will check vitamin B12

## 2024-03-23 NOTE — Assessment & Plan Note (Signed)
 Will check lipid panel

## 2024-03-23 NOTE — Assessment & Plan Note (Signed)
 Will check vitamin D  level and supplement as needed.    Also encouraged to spend 15 minutes in the sun daily.

## 2024-03-23 NOTE — Assessment & Plan Note (Signed)

## 2024-03-26 ENCOUNTER — Institutional Professional Consult (permissible substitution): Admitting: Plastic Surgery

## 2024-04-22 ENCOUNTER — Encounter: Payer: Self-pay | Admitting: Plastic Surgery

## 2024-04-30 ENCOUNTER — Encounter: Payer: Self-pay | Admitting: Plastic Surgery

## 2024-04-30 ENCOUNTER — Ambulatory Visit (INDEPENDENT_AMBULATORY_CARE_PROVIDER_SITE_OTHER): Payer: Self-pay | Admitting: Plastic Surgery

## 2024-04-30 VITALS — BP 135/87 | HR 63 | Temp 98.4°F | Ht 59.0 in | Wt 183.0 lb

## 2024-04-30 DIAGNOSIS — L987 Excessive and redundant skin and subcutaneous tissue: Secondary | ICD-10-CM

## 2024-04-30 DIAGNOSIS — E65 Localized adiposity: Secondary | ICD-10-CM

## 2024-04-30 NOTE — Progress Notes (Signed)
 Ms. Deacon returns today to discuss brachioplasty.  She is currently scheduled for a panniculectomy.  I do not think that she is a good candidate for brachioplasty at this time.  I believe with some weight loss that she would be an excellent candidate and would be more than happy to perform the procedure for her but I would like to wait until I feel that she would get the best aesthetic outcome possible.  I discussed this with her and discussed continued weight loss.  She understands and agrees that we should wait to proceed with brachioplasty.  She will let me know if she wants to proceed with panniculectomy alone or if she wants to wait for continued weight loss and proceed with panniculectomy and brachioplasty at the same time.

## 2024-05-01 ENCOUNTER — Ambulatory Visit: Admitting: Dermatology

## 2024-05-16 ENCOUNTER — Ambulatory Visit: Admitting: Allergy

## 2024-05-26 ENCOUNTER — Encounter: Payer: Self-pay | Admitting: Radiology

## 2024-06-12 ENCOUNTER — Encounter: Admitting: Physician Assistant

## 2024-06-13 ENCOUNTER — Encounter: Admitting: Physician Assistant

## 2024-06-16 ENCOUNTER — Ambulatory Visit: Admitting: Physician Assistant

## 2024-06-16 ENCOUNTER — Telehealth: Payer: Self-pay

## 2024-06-16 VITALS — BP 163/113 | HR 63 | Wt 179.0 lb

## 2024-06-16 DIAGNOSIS — M793 Panniculitis, unspecified: Secondary | ICD-10-CM

## 2024-06-16 DIAGNOSIS — Z9884 Bariatric surgery status: Secondary | ICD-10-CM | POA: Diagnosis not present

## 2024-06-16 MED ORDER — ONDANSETRON 4 MG PO TBDP: 4.0000 mg | ORAL_TABLET | Freq: Three times a day (TID) | ORAL | 0 refills | Status: AC | PRN

## 2024-06-16 MED ORDER — OXYCODONE HCL 5 MG PO TABS: 5.0000 mg | ORAL_TABLET | Freq: Three times a day (TID) | ORAL | 0 refills | Status: AC | PRN

## 2024-06-16 NOTE — Progress Notes (Signed)
 Patient ID: [name], female    DOB: [birth year], 48 y.o.   MRN: [id number]  Chief Complaint  Patient presents with   Pre-op Exam      ICD-10-CM   1. Panniculitis  M79.3        History of Present Illness: [name] is a 48 y.o.  female  with a history of panniculitis.  She presents for preoperative evaluation for upcoming procedure, panniculectomy, scheduled for 06/30/2024 with Dr. Waddell.  The patient has not had problems with anesthesia.  She denies any personal or family history of blood clots or clotting disorder.  She also denies any severe pulmonary disease, nicotine use, or varicosities.  She does have a history of ovarian cancer now s/p hysterectomy.  She also confirms history of CAD with cardiac catheterization and stent placement.  She confirms a 90 pound weight loss since her bariatric surgery and feels as though her weight is now stable/plateaued.  Discussed risks of surgery and she is agreeable to proceed.  Will request a surgical clearance by her internal medicine team.  Summary of Previous Visit: She was seen for initial consult Dr. Waddell 03/20/2024.  At that time, she reported large overhanging pannus in the context of weight loss.  History of bariatric surgery.  She was deemed an appropriate candidate for panniculectomy.  No clearances requested per surgical scheduling order.  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  PMH Significant for: Panniculitis, gastric bypass with subsequent extreme weight loss, HTN, HLD, GERD, CAD with coronary artery stent placement previously on anticoagulation.     Past Medical History: Allergies: Allergies  Allergen Reactions   Venlafaxine Anaphylaxis and Nausea Only    Insomnia; Depression Insomnia; Depression     Current Medications:  Current Outpatient Medications:    amLODipine  (NORVASC ) 10 MG tablet, Take 1 tablet (10 mg total) by mouth daily., Disp: 90 tablet, Rfl: 0    atorvastatin  (LIPITOR) 40 MG tablet, Take 1 tablet (40 mg total) by mouth daily., Disp: 90 tablet, Rfl: 1   EPINEPHrine  0.3 mg/0.3 mL IJ SOAJ injection, Inject 0.3 mg into the muscle as needed for anaphylaxis., Disp: 1 each, Rfl: 2   hydrOXYzine (ATARAX) 10 MG tablet, 1-2 tabs by mouth every 4-6 hours as needed for itching, Disp: , Rfl:    ondansetron  (ZOFRAN -ODT) 4 MG disintegrating tablet, Take 1 tablet (4 mg total) by mouth every 8 (eight) hours as needed for nausea or vomiting., Disp: 20 tablet, Rfl: 0   oxyCODONE  (ROXICODONE ) 5 MG immediate release tablet, Take 1 tablet (5 mg total) by mouth every 8 (eight) hours as needed for up to 5 days for severe pain (pain score 7-10)., Disp: 15 tablet, Rfl: 0   pantoprazole  (PROTONIX ) 40 MG tablet, Take 1 tablet (40 mg total) by mouth daily., Disp: 90 tablet, Rfl: 1   Vitamin D , Ergocalciferol , (DRISDOL ) 1.25 MG (50000 UNIT) CAPS capsule, Take 1 capsule (50,000 Units total) by mouth 2 (two) times a week., Disp: 24 capsule, Rfl: 1   cyclobenzaprine  (FLEXERIL ) 5 MG tablet, Take 1 tablet (5 mg total) by mouth 3 (three) times daily as needed for muscle spasms. 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 - states BP is under control; has been on med. x 12 yr.   Migraines    Right tennis elbow 11/21/2013    Past Surgical History: Past Surgical History:  Procedure Laterality Date   ABDOMINAL HYSTERECTOMY     complete   CARDIAC CATHETERIZATION     CARPAL TUNNEL RELEASE Right 12/12/2013   Procedure: ENDOSCOPY RIGHT WRIST CARPAL TUNNEL RELEASE EXCISION GANGLION WRIST  PRIMARY ;  Surgeon: Evalene JONETTA Chancy, MD;  Location: [facility];  Service: Orthopedics;  Laterality: Right;   CARPAL TUNNEL RELEASE Left 05/31/2022   Procedure: LEFT CARPAL TUNNEL RELEASE;  Surgeon: Jerri Kay HERO, MD;  Location: [facility];  Service: Orthopedics;  Laterality: Left;   CESAREAN SECTION  03/11/2005; 04/05/2008   CHOLECYSTECTOMY  02/11/2004   lap. chole.   GASTRIC BYPASS     LATERAL EPICONDYLE RELEASE Right 12/12/2013   Procedure: RIGHT TENNIS ELBOW RELEASE WITH BONE REPAIR;  Surgeon: Evalene JONETTA Chancy, MD;  Location: [facility];  Service: Orthopedics;  Laterality: Right;   LUMBAR LAMINECTOMY WITH COFLEX 1 LEVEL Bilateral 01/14/2015   Procedure: Laminectomy - bilateral - L4-L5 ;  Surgeon: Darina Boehringer, MD;  Location: [facility];  Service: Neurosurgery;  Laterality: Bilateral;  Laminectomy - bilateral - L4-L5    LUMBAR WOUND DEBRIDEMENT N/A 02/02/2015   Procedure: LUMBAR WOUND DEBRIDEMENT, WASHOUT, CLOSURE;  Surgeon: Gerldine Maizes, MD;  Location: [facility];  Service: Neurosurgery;  Laterality: N/A;  lumbar wound debridement   TUBAL LIGATION  04/05/2008    Social History: Social History   Socioeconomic History   Marital status: Married    Spouse name: Not on file   Number of children: 3   Years of education: 14   Highest education level: Associate degree: occupational, scientist, product/process development, or vocational program  Occupational History   Not on file  Tobacco Use   Smoking status: Never    Passive exposure: Current   Smokeless tobacco: Never  Vaping Use   Vaping status: Never Used  Substance and Sexual Activity   Alcohol use: No   Drug use: No   Sexual activity: Yes    Birth control/protection: None  Other Topics Concern   Not on file  Social History Narrative   Not on file   Social Drivers of Health   Financial Resource Strain: Medium Risk (01/17/2024)   Overall Financial Resource Strain (CARDIA)    Difficulty of Paying  Living Expenses: Somewhat hard  Food Insecurity: No Food Insecurity (01/17/2024)   Hunger Vital Sign    Worried About Running Out of Food in the Last Year: Never true    Ran Out of Food in the Last Year: Never true  Transportation Needs: No Transportation Needs (01/17/2024)   PRAPARE - Administrator, Civil Service (Medical): No    Lack of Transportation (Non-Medical): No  Physical Activity: Insufficiently Active (01/17/2024)   Exercise Vital Sign    Days of Exercise per Week: 2 days    Minutes of Exercise per Session: 30 min  Stress: No Stress Concern Present (01/17/2024)   [employer] of Occupational Health - Occupational Stress Questionnaire    Feeling of Stress: Only a little  Social Connections: Moderately Isolated (01/17/2024)   Social Connection and Isolation Panel  Frequency of Communication with Friends and Family: Once a week    Frequency of Social Gatherings with Friends and Family: Once a week    Attends Religious Services: 1 to 4 times per year    Active Member of [employer] or Organizations: No    Attends Engineer, Structural: Not on file    Marital Status: Married  Catering Manager Violence: Not on file    Family History: Family History  Problem Relation Age of Onset   Hypertension Mother    Diabetes Mother    Cancer Mother    Arthritis Mother    Asthma Father    Anemia Sister    Diabetes Brother    Hypertension Brother    Diabetes Brother    Hypertension Maternal Grandmother    Heart attack Paternal Grandmother    Cancer Other    Heart disease Maternal Uncle    Breast cancer Neg Hx     Review of Systems: ROS Denies any recent chest pain, difficulty breathing, leg swelling, fevers.  Physical Exam: Vital Signs BP (!) 163/113 (BP Location: Left Arm, Patient Position: Sitting, Cuff Size: Normal) Comment: Patient stated she just took her BP meds about 20 minutes ago. Denies headaches, changes in vision, or chest pain.  Pulse 63   Wt  179 lb (81.2 kg)   LMP 09/17/2010   SpO2 98%   BMI 36.15 kg/m   Physical Exam Constitutional:      General: Not in acute distress.    Appearance: Normal appearance. Not ill-appearing.  HENT:     Head: Normocephalic and atraumatic.  Eyes:     Pupils: Pupils are equal, round. Cardiovascular:     Rate and Rhythm: Normal rate.    Pulses: Normal pulses.  Pulmonary:     Effort: No respiratory distress or increased work of breathing.  Speaks in full sentences. Abdominal:     General: Abdomen is flat. No distension.   Musculoskeletal: Normal range of motion. No lower extremity swelling or edema. Skin:    General: Skin is warm and dry.     Findings: No erythema or rash.  Neurological:     Mental Status: Alert and oriented to person, place, and time.  Psychiatric:        Mood and Affect: Mood normal.        Behavior: Behavior normal.    Assessment/Plan: The patient is scheduled for panniculectomy with Dr. Waddell.  Risks, benefits, and alternatives of procedure discussed, questions answered and consent obtained.    Smoking Status: Non-smoker.  Caprini Score: 6; Risk Factors include: Age, BMI greater than 25, history of cancer, and length of planned surgery. Recommendation for mechanical prophylaxis. Encourage early ambulation.   Pictures obtained: 03/20/2024  Post-op Rx sent to pharmacy: Oxycodone  and Zofran .  Patient was provided with the General Surgical Risk consent document and Pain Medication Agreement prior to their appointment.  They had adequate time to read through the risk consent documents and Pain Medication Agreement. We also discussed them in person together during this preop appointment. All of their questions were answered to their satisfaction.  Recommended calling if they have any further questions.  Risk consent form and Pain Medication Agreement to be scanned into patient's chart.  The risk that can be encountered for this procedure were discussed and include the  following but not limited to these: asymmetry, fluid accumulation, firmness of the tissue, skin loss, decrease or no sensation, fat necrosis, bleeding, infection, healing delay.  Deep vein thrombosis, cardiac  and pulmonary complications are risks to any procedure.  There are risks of anesthesia, changes to skin sensation and injury to nerves or blood vessels.  The muscle can be temporarily or permanently injured.  You may have an allergic reaction to tape, suture, glue, blood products which can result in skin discoloration, swelling, pain, skin lesions, poor healing.  Any of these can lead to the need for revisonal surgery or stage procedures.  Weight gain and weigh loss can also effect the long term appearance. The results are not guaranteed to last a lifetime.  Future surgery may be required.      Electronically signed by: Honora Seip, PA-C 06/16/2024 4:26 PM

## 2024-06-16 NOTE — H&P (View-Only) (Signed)
 Patient ID: MALAY FANTROY, female    DOB: [birth year], 48 y.o.   MRN: [id number]  Chief Complaint  Patient presents with   Pre-op Exam      ICD-10-CM   1. Panniculitis  M79.3        History of Present Illness: Lior SHELA ESSES is a 48 y.o.  female  with a history of panniculitis.  She presents for preoperative evaluation for upcoming procedure, panniculectomy, scheduled for 06/30/2024 with Dr. Waddell.  The patient has not had problems with anesthesia.  She denies any personal or family history of blood clots or clotting disorder.  She also denies any severe pulmonary disease, nicotine use, or varicosities.  She does have a history of ovarian cancer now s/p hysterectomy.  She also confirms history of CAD with cardiac catheterization and stent placement.  She confirms a 90 pound weight loss since her bariatric surgery and feels as though her weight is now stable/plateaued.  Discussed risks of surgery and she is agreeable to proceed.  Will request a surgical clearance by her internal medicine team.  Summary of Previous Visit: She was seen for initial consult Dr. Waddell 03/20/2024.  At that time, she reported large overhanging pannus in the context of weight loss.  History of bariatric surgery.  She was deemed an appropriate candidate for panniculectomy.  No clearances requested per surgical scheduling order.  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  PMH Significant for: Panniculitis, gastric bypass with subsequent extreme weight loss, HTN, HLD, GERD, CAD with coronary artery stent placement previously on anticoagulation.     Past Medical History: Allergies: Allergies  Allergen Reactions   Venlafaxine Anaphylaxis and Nausea Only    Insomnia; Depression Insomnia; Depression     Current Medications:  Current Outpatient Medications:    amLODipine  (NORVASC ) 10 MG tablet, Take 1 tablet (10 mg total) by mouth daily., Disp: 90 tablet, Rfl: 0    atorvastatin  (LIPITOR) 40 MG tablet, Take 1 tablet (40 mg total) by mouth daily., Disp: 90 tablet, Rfl: 1   EPINEPHrine  0.3 mg/0.3 mL IJ SOAJ injection, Inject 0.3 mg into the muscle as needed for anaphylaxis., Disp: 1 each, Rfl: 2   hydrOXYzine (ATARAX) 10 MG tablet, 1-2 tabs by mouth every 4-6 hours as needed for itching, Disp: , Rfl:    ondansetron  (ZOFRAN -ODT) 4 MG disintegrating tablet, Take 1 tablet (4 mg total) by mouth every 8 (eight) hours as needed for nausea or vomiting., Disp: 20 tablet, Rfl: 0   oxyCODONE  (ROXICODONE ) 5 MG immediate release tablet, Take 1 tablet (5 mg total) by mouth every 8 (eight) hours as needed for up to 5 days for severe pain (pain score 7-10)., Disp: 15 tablet, Rfl: 0   pantoprazole  (PROTONIX ) 40 MG tablet, Take 1 tablet (40 mg total) by mouth daily., Disp: 90 tablet, Rfl: 1   Vitamin D , Ergocalciferol , (DRISDOL ) 1.25 MG (50000 UNIT) CAPS capsule, Take 1 capsule (50,000 Units total) by mouth 2 (two) times a week., Disp: 24 capsule, Rfl: 1   cyclobenzaprine  (FLEXERIL ) 5 MG tablet, Take 1 tablet (5 mg total) by mouth 3 (three) times daily as needed for muscle spasms. 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 - states BP is under control; has been on med. x 12 yr.   Migraines    Right tennis elbow 11/21/2013    Past Surgical History: Past Surgical History:  Procedure Laterality Date   ABDOMINAL HYSTERECTOMY     complete   CARDIAC CATHETERIZATION     CARPAL TUNNEL RELEASE Right 12/12/2013   Procedure: ENDOSCOPY RIGHT WRIST CARPAL TUNNEL RELEASE EXCISION GANGLION WRIST  PRIMARY ;  Surgeon: Evalene JONETTA Chancy, MD;  Location: [facility];  Service: Orthopedics;  Laterality: Right;   CARPAL TUNNEL RELEASE Left 05/31/2022   Procedure: LEFT CARPAL TUNNEL RELEASE;  Surgeon: Jerri Kay HERO, MD;  Location: [facility];  Service: Orthopedics;  Laterality: Left;   CESAREAN SECTION  03/11/2005; 04/05/2008   CHOLECYSTECTOMY  02/11/2004   lap. chole.   GASTRIC BYPASS     LATERAL EPICONDYLE RELEASE Right 12/12/2013   Procedure: RIGHT TENNIS ELBOW RELEASE WITH BONE REPAIR;  Surgeon: Evalene JONETTA Chancy, MD;  Location: [facility];  Service: Orthopedics;  Laterality: Right;   LUMBAR LAMINECTOMY WITH COFLEX 1 LEVEL Bilateral 01/14/2015   Procedure: Laminectomy - bilateral - L4-L5 ;  Surgeon: Darina Boehringer, MD;  Location: [facility];  Service: Neurosurgery;  Laterality: Bilateral;  Laminectomy - bilateral - L4-L5    LUMBAR WOUND DEBRIDEMENT N/A 02/02/2015   Procedure: LUMBAR WOUND DEBRIDEMENT, WASHOUT, CLOSURE;  Surgeon: Gerldine Maizes, MD;  Location: [facility];  Service: Neurosurgery;  Laterality: N/A;  lumbar wound debridement   TUBAL LIGATION  04/05/2008    Social History: Social History   Socioeconomic History   Marital status: Married    Spouse name: Not on file   Number of children: 3   Years of education: 14   Highest education level: Associate degree: occupational, scientist, product/process development, or vocational program  Occupational History   Not on file  Tobacco Use   Smoking status: Never    Passive exposure: Current   Smokeless tobacco: Never  Vaping Use   Vaping status: Never Used  Substance and Sexual Activity   Alcohol use: No   Drug use: No   Sexual activity: Yes    Birth control/protection: None  Other Topics Concern   Not on file  Social History Narrative   Not on file   Social Drivers of Health   Financial Resource Strain: Medium Risk (01/17/2024)   Overall Financial Resource Strain (CARDIA)    Difficulty of Paying  Living Expenses: Somewhat hard  Food Insecurity: No Food Insecurity (01/17/2024)   Hunger Vital Sign    Worried About Running Out of Food in the Last Year: Never true    Ran Out of Food in the Last Year: Never true  Transportation Needs: No Transportation Needs (01/17/2024)   PRAPARE - Administrator, Civil Service (Medical): No    Lack of Transportation (Non-Medical): No  Physical Activity: Insufficiently Active (01/17/2024)   Exercise Vital Sign    Days of Exercise per Week: 2 days    Minutes of Exercise per Session: 30 min  Stress: No Stress Concern Present (01/17/2024)   [employer] of Occupational Health - Occupational Stress Questionnaire    Feeling of Stress: Only a little  Social Connections: Moderately Isolated (01/17/2024)   Social Connection and Isolation Panel  Frequency of Communication with Friends and Family: Once a week    Frequency of Social Gatherings with Friends and Family: Once a week    Attends Religious Services: 1 to 4 times per year    Active Member of [employer] or Organizations: No    Attends Engineer, Structural: Not on file    Marital Status: Married  Catering Manager Violence: Not on file    Family History: Family History  Problem Relation Age of Onset   Hypertension Mother    Diabetes Mother    Cancer Mother    Arthritis Mother    Asthma Father    Anemia Sister    Diabetes Brother    Hypertension Brother    Diabetes Brother    Hypertension Maternal Grandmother    Heart attack Paternal Grandmother    Cancer Other    Heart disease Maternal Uncle    Breast cancer Neg Hx     Review of Systems: ROS Denies any recent chest pain, difficulty breathing, leg swelling, fevers.  Physical Exam: Vital Signs BP (!) 163/113 (BP Location: Left Arm, Patient Position: Sitting, Cuff Size: Normal) Comment: Patient stated she just took her BP meds about 20 minutes ago. Denies headaches, changes in vision, or chest pain.  Pulse 63   Wt  179 lb (81.2 kg)   LMP 09/17/2010   SpO2 98%   BMI 36.15 kg/m   Physical Exam Constitutional:      General: Not in acute distress.    Appearance: Normal appearance. Not ill-appearing.  HENT:     Head: Normocephalic and atraumatic.  Eyes:     Pupils: Pupils are equal, round. Cardiovascular:     Rate and Rhythm: Normal rate.    Pulses: Normal pulses.  Pulmonary:     Effort: No respiratory distress or increased work of breathing.  Speaks in full sentences. Abdominal:     General: Abdomen is flat. No distension.   Musculoskeletal: Normal range of motion. No lower extremity swelling or edema. Skin:    General: Skin is warm and dry.     Findings: No erythema or rash.  Neurological:     Mental Status: Alert and oriented to person, place, and time.  Psychiatric:        Mood and Affect: Mood normal.        Behavior: Behavior normal.    Assessment/Plan: The patient is scheduled for panniculectomy with Dr. Waddell.  Risks, benefits, and alternatives of procedure discussed, questions answered and consent obtained.    Smoking Status: Non-smoker.  Caprini Score: 6; Risk Factors include: Age, BMI greater than 25, history of cancer, and length of planned surgery. Recommendation for mechanical prophylaxis. Encourage early ambulation.   Pictures obtained: 03/20/2024  Post-op Rx sent to pharmacy: Oxycodone  and Zofran .  Patient was provided with the General Surgical Risk consent document and Pain Medication Agreement prior to their appointment.  They had adequate time to read through the risk consent documents and Pain Medication Agreement. We also discussed them in person together during this preop appointment. All of their questions were answered to their satisfaction.  Recommended calling if they have any further questions.  Risk consent form and Pain Medication Agreement to be scanned into patient's chart.  The risk that can be encountered for this procedure were discussed and include the  following but not limited to these: asymmetry, fluid accumulation, firmness of the tissue, skin loss, decrease or no sensation, fat necrosis, bleeding, infection, healing delay.  Deep vein thrombosis, cardiac  and pulmonary complications are risks to any procedure.  There are risks of anesthesia, changes to skin sensation and injury to nerves or blood vessels.  The muscle can be temporarily or permanently injured.  You may have an allergic reaction to tape, suture, glue, blood products which can result in skin discoloration, swelling, pain, skin lesions, poor healing.  Any of these can lead to the need for revisonal surgery or stage procedures.  Weight gain and weigh loss can also effect the long term appearance. The results are not guaranteed to last a lifetime.  Future surgery may be required.      Electronically signed by: Honora Seip, PA-C 06/16/2024 4:26 PM

## 2024-06-16 NOTE — Telephone Encounter (Signed)
 Faxed surgical clearance to [name] FNP. Confirmation of receipt.

## 2024-06-18 ENCOUNTER — Encounter (HOSPITAL_BASED_OUTPATIENT_CLINIC_OR_DEPARTMENT_OTHER): Payer: Self-pay | Admitting: Plastic Surgery

## 2024-06-18 ENCOUNTER — Other Ambulatory Visit: Payer: Self-pay | Admitting: Nurse Practitioner

## 2024-06-18 ENCOUNTER — Other Ambulatory Visit: Payer: Self-pay

## 2024-06-18 NOTE — Progress Notes (Signed)
   06/18/24 1137  PAT Phone Screen  Is the patient taking a GLP-1 receptor agonist? Yes  Has the patient been informed on holding medication? Yes (11/21 last dose)  Do You Have Diabetes? No  Do You Have Hypertension? Yes  Have You Ever Been to the ER for Asthma? No  Have You Taken Oral Steroids in the Past 3 Months? No  Do you Take Phenteramine or any Other Diet Drugs? No  Recent  Lab Work, EKG, CXR? Yes  Where was this test performed? EKG 03/17/24  Do you have a history of heart problems? Yes  Cardiologist Name Last OV w/ Cardiology 04/02/23- Pt no longer on plavix and to f/u with PCP  Have you ever had tests on your heart? Yes  What cardiac tests were performed? Cardiac Cath  What date/year were cardiac tests completed? 06/13/22 DES-LAD  Results viewable: [facility] Media Tab;Care Everywhere  Any Recent Hospitalizations? No  Height 4' 11 (1.499 m)  Weight 81.2 kg  Pat Appointment Scheduled No  Reason for No Appointment Not Needed

## 2024-06-18 NOTE — Telephone Encounter (Signed)
 Copied from CRM #[id number]. Topic: Clinical - Medication Refill >> Jun 18, 2024 11:00 AM Yolanda T wrote: Medication: amLODipine  (NORVASC ) 10 MG tablet  Has the patient contacted their pharmacy? Yes  This is the patient's preferred pharmacy:  [facility] [address] [place] - [address] [place] [address] Phone: [phone] Fax: [phone]  Is this the correct pharmacy for this prescription? Yes  Has the prescription been filled recently? Yes  Is the patient out of the medication? No  Has the patient been seen for an appointment in the last year OR does the patient have an upcoming appointment? Yes  Can we respond through MyChart? Yes  Agent: Please be advised that Rx refills may take up to 3 business days. We ask that you follow-up with your pharmacy.

## 2024-06-24 MED ORDER — AMLODIPINE BESYLATE 10 MG PO TABS: 10.0000 mg | ORAL_TABLET | Freq: Every day | ORAL | 0 refills | Status: AC

## 2024-06-30 ENCOUNTER — Ambulatory Visit (HOSPITAL_BASED_OUTPATIENT_CLINIC_OR_DEPARTMENT_OTHER): Admitting: Certified Registered"

## 2024-06-30 ENCOUNTER — Encounter (HOSPITAL_BASED_OUTPATIENT_CLINIC_OR_DEPARTMENT_OTHER): Payer: Self-pay | Admitting: Plastic Surgery

## 2024-06-30 ENCOUNTER — Ambulatory Visit (HOSPITAL_BASED_OUTPATIENT_CLINIC_OR_DEPARTMENT_OTHER)
Admission: RE | Admit: 2024-06-30 | Discharge: 2024-06-30 | Disposition: A | Attending: Plastic Surgery | Admitting: Plastic Surgery

## 2024-06-30 ENCOUNTER — Other Ambulatory Visit: Payer: Self-pay

## 2024-06-30 ENCOUNTER — Encounter (HOSPITAL_BASED_OUTPATIENT_CLINIC_OR_DEPARTMENT_OTHER)
Admission: RE | Disposition: A | Discharge status: Home / Self Care | Payer: Self-pay | Source: Home / Self Care | Attending: Plastic Surgery

## 2024-06-30 DIAGNOSIS — Z7722 Contact with and (suspected) exposure to environmental tobacco smoke (acute) (chronic): Secondary | ICD-10-CM | POA: Diagnosis not present

## 2024-06-30 DIAGNOSIS — Z59868 Other specified financial insecurity: Secondary | ICD-10-CM | POA: Diagnosis not present

## 2024-06-30 DIAGNOSIS — Z90710 Acquired absence of both cervix and uterus: Secondary | ICD-10-CM | POA: Diagnosis not present

## 2024-06-30 DIAGNOSIS — M793 Panniculitis, unspecified: Secondary | ICD-10-CM | POA: Diagnosis not present

## 2024-06-30 DIAGNOSIS — Z9884 Bariatric surgery status: Secondary | ICD-10-CM | POA: Diagnosis not present

## 2024-06-30 DIAGNOSIS — R21 Rash and other nonspecific skin eruption: Secondary | ICD-10-CM | POA: Diagnosis not present

## 2024-06-30 DIAGNOSIS — I10 Essential (primary) hypertension: Secondary | ICD-10-CM | POA: Diagnosis not present

## 2024-06-30 DIAGNOSIS — I2510 Atherosclerotic heart disease of native coronary artery without angina pectoris: Secondary | ICD-10-CM | POA: Diagnosis not present

## 2024-06-30 DIAGNOSIS — F418 Other specified anxiety disorders: Secondary | ICD-10-CM | POA: Diagnosis not present

## 2024-06-30 DIAGNOSIS — K219 Gastro-esophageal reflux disease without esophagitis: Secondary | ICD-10-CM | POA: Diagnosis not present

## 2024-06-30 DIAGNOSIS — Z955 Presence of coronary angioplasty implant and graft: Secondary | ICD-10-CM | POA: Diagnosis not present

## 2024-06-30 DIAGNOSIS — Z8543 Personal history of malignant neoplasm of ovary: Secondary | ICD-10-CM | POA: Diagnosis not present

## 2024-06-30 HISTORY — PX: PANNICULECTOMY: SHX5360

## 2024-06-30 SURGERY — PANNICULECTOMY
Anesthesia: General | Site: Abdomen

## 2024-06-30 MED ORDER — TRANEXAMIC ACID 1000 MG/10ML IV SOLN
INTRAVENOUS | Status: DC | PRN
  Administered 2024-06-30: 3000 mg via TOPICAL

## 2024-06-30 MED ORDER — FENTANYL CITRATE (PF) 100 MCG/2ML IJ SOLN
INTRAMUSCULAR | Status: AC
  Filled 2024-06-30: qty 2

## 2024-06-30 MED ORDER — AMISULPRIDE (ANTIEMETIC) 5 MG/2ML IV SOLN
INTRAVENOUS | Status: AC
  Filled 2024-06-30: qty 4

## 2024-06-30 MED ORDER — ACETAMINOPHEN 500 MG PO TABS: 1000.0000 mg | ORAL_TABLET | Freq: Once | ORAL | Status: DC

## 2024-06-30 MED ORDER — SUGAMMADEX SODIUM 200 MG/2ML IV SOLN
INTRAVENOUS | Status: DC | PRN
  Administered 2024-06-30: 200 mg via INTRAVENOUS

## 2024-06-30 MED ORDER — EPHEDRINE 5 MG/ML INJ
INTRAVENOUS | Status: AC
  Filled 2024-06-30: qty 5

## 2024-06-30 MED ORDER — ROCURONIUM BROMIDE 10 MG/ML (PF) SYRINGE
PREFILLED_SYRINGE | INTRAVENOUS | Status: DC | PRN
  Administered 2024-06-30: 60 mg via INTRAVENOUS

## 2024-06-30 MED ORDER — 0.9 % SODIUM CHLORIDE (POUR BTL) OPTIME
TOPICAL | Status: DC | PRN
  Administered 2024-06-30 (×2): 1000 mL

## 2024-06-30 MED ORDER — MIDAZOLAM HCL 2 MG/2ML IJ SOLN
INTRAMUSCULAR | Status: AC
  Filled 2024-06-30: qty 2

## 2024-06-30 MED ORDER — CHLORHEXIDINE GLUCONATE CLOTH 2 % EX PADS: 6.0000 | MEDICATED_PAD | Freq: Once | CUTANEOUS | Status: DC

## 2024-06-30 MED ORDER — OXYCODONE HCL 5 MG PO TABS
5.0000 mg | ORAL_TABLET | Freq: Once | ORAL | Status: AC | PRN
  Administered 2024-06-30: 5 mg via ORAL

## 2024-06-30 MED ORDER — ACETAMINOPHEN 10 MG/ML IV SOLN
INTRAVENOUS | Status: AC
  Filled 2024-06-30: qty 100

## 2024-06-30 MED ORDER — LIDOCAINE 2% (20 MG/ML) 5 ML SYRINGE
INTRAMUSCULAR | Status: DC | PRN
  Administered 2024-06-30: 40 mg via INTRAVENOUS

## 2024-06-30 MED ORDER — ONDANSETRON HCL 4 MG/2ML IJ SOLN
INTRAMUSCULAR | Status: AC
  Filled 2024-06-30: qty 2

## 2024-06-30 MED ORDER — LIDOCAINE 2% (20 MG/ML) 5 ML SYRINGE
INTRAMUSCULAR | Status: AC
  Filled 2024-06-30: qty 5

## 2024-06-30 MED ORDER — KETOROLAC TROMETHAMINE 30 MG/ML IJ SOLN
INTRAMUSCULAR | Status: AC
  Filled 2024-06-30: qty 1

## 2024-06-30 MED ORDER — HYDROMORPHONE HCL 1 MG/ML IJ SOLN
INTRAMUSCULAR | Status: AC
  Filled 2024-06-30: qty 0.5

## 2024-06-30 MED ORDER — SODIUM CHLORIDE (PF) 0.9 % IJ SOLN
INTRAMUSCULAR | Status: DC | PRN
  Administered 2024-06-30: 100 mL

## 2024-06-30 MED ORDER — CEFAZOLIN SODIUM-DEXTROSE 2-4 GM/100ML-% IV SOLN
INTRAVENOUS | Status: AC
  Filled 2024-06-30: qty 100

## 2024-06-30 MED ORDER — OXYCODONE HCL 5 MG PO TABS
ORAL_TABLET | ORAL | Status: AC
  Filled 2024-06-30: qty 1

## 2024-06-30 MED ORDER — OXYCODONE HCL 5 MG/5ML PO SOLN: 5.0000 mg | Freq: Once | ORAL | Status: AC | PRN

## 2024-06-30 MED ORDER — CEFAZOLIN SODIUM-DEXTROSE 2-4 GM/100ML-% IV SOLN
2.0000 g | INTRAVENOUS | Status: AC
  Administered 2024-06-30: 2 g via INTRAVENOUS

## 2024-06-30 MED ORDER — KETOROLAC TROMETHAMINE 30 MG/ML IJ SOLN: 30.0000 mg | Freq: Once | INTRAMUSCULAR | Status: DC

## 2024-06-30 MED ORDER — PROPOFOL 10 MG/ML IV BOLUS
INTRAVENOUS | Status: DC | PRN
  Administered 2024-06-30: 140 mg via INTRAVENOUS

## 2024-06-30 MED ORDER — MEPERIDINE HCL 25 MG/ML IJ SOLN: 6.2500 mg | INTRAMUSCULAR | Status: DC | PRN

## 2024-06-30 MED ORDER — ONDANSETRON HCL 4 MG/2ML IJ SOLN
INTRAMUSCULAR | Status: DC | PRN
  Administered 2024-06-30: 4 mg via INTRAVENOUS

## 2024-06-30 MED ORDER — SCOPOLAMINE 1 MG/3DAYS TD PT72: 1.0000 | MEDICATED_PATCH | TRANSDERMAL | Status: DC

## 2024-06-30 MED ORDER — AMISULPRIDE (ANTIEMETIC) 5 MG/2ML IV SOLN
10.0000 mg | Freq: Once | INTRAVENOUS | Status: AC
  Administered 2024-06-30: 10 mg via INTRAVENOUS

## 2024-06-30 MED ORDER — LACTATED RINGERS IV SOLN: INTRAVENOUS | Status: DC

## 2024-06-30 MED ORDER — MIDAZOLAM HCL (PF) 2 MG/2ML IJ SOLN
INTRAMUSCULAR | Status: DC | PRN
  Administered 2024-06-30: 2 mg via INTRAVENOUS

## 2024-06-30 MED ORDER — ACETAMINOPHEN 10 MG/ML IV SOLN
1000.0000 mg | Freq: Once | INTRAVENOUS | Status: DC | PRN
  Administered 2024-06-30: 1000 mg via INTRAVENOUS

## 2024-06-30 MED ORDER — ROCURONIUM BROMIDE 10 MG/ML (PF) SYRINGE
PREFILLED_SYRINGE | INTRAVENOUS | Status: AC
  Filled 2024-06-30: qty 10

## 2024-06-30 MED ORDER — DROPERIDOL 2.5 MG/ML IJ SOLN: 0.6250 mg | Freq: Once | INTRAMUSCULAR | Status: DC | PRN

## 2024-06-30 MED ORDER — EPHEDRINE SULFATE-NACL 50-0.9 MG/10ML-% IV SOSY
PREFILLED_SYRINGE | INTRAVENOUS | Status: DC | PRN
  Administered 2024-06-30 (×3): 10 mg via INTRAVENOUS

## 2024-06-30 MED ORDER — SCOPOLAMINE 1 MG/3DAYS TD PT72
MEDICATED_PATCH | TRANSDERMAL | Status: AC
  Filled 2024-06-30: qty 1

## 2024-06-30 MED ORDER — DEXAMETHASONE SOD PHOSPHATE PF 10 MG/ML IJ SOLN
INTRAMUSCULAR | Status: DC | PRN
  Administered 2024-06-30: 5 mg via INTRAVENOUS

## 2024-06-30 MED ORDER — FENTANYL CITRATE (PF) 100 MCG/2ML IJ SOLN
INTRAMUSCULAR | Status: DC | PRN
  Administered 2024-06-30 (×2): 50 ug via INTRAVENOUS

## 2024-06-30 MED ORDER — PROPOFOL 10 MG/ML IV BOLUS
INTRAVENOUS | Status: AC
  Filled 2024-06-30: qty 20

## 2024-06-30 MED ORDER — HYDROMORPHONE HCL 1 MG/ML IJ SOLN
0.2500 mg | INTRAMUSCULAR | Status: DC | PRN
  Administered 2024-06-30 (×3): 0.5 mg via INTRAVENOUS

## 2024-06-30 MED ORDER — HYDROMORPHONE HCL 1 MG/ML IJ SOLN
INTRAMUSCULAR | Status: DC | PRN
  Administered 2024-06-30: .5 mg via INTRAVENOUS

## 2024-06-30 SURGICAL SUPPLY — 53 items
BINDER ABDOMINAL 12 ML 46-62 (SOFTGOODS) IMPLANT
BINDER ABDOMINAL 12 SM 30-45 (SOFTGOODS) IMPLANT
BIOPATCH RED 1 DISK 7.0 (GAUZE/BANDAGES/DRESSINGS) ×2 IMPLANT
BLADE CLIPPER SURG (BLADE) IMPLANT
BLADE SURG 10 STRL SS (BLADE) ×4 IMPLANT
BLADE SURG 11 STRL SS (BLADE) IMPLANT
BLADE SURG 15 STRL LF DISP TIS (BLADE) ×1 IMPLANT
CLIP APPLIE 9.375 MED OPEN (MISCELLANEOUS) IMPLANT
DERMABOND ADVANCED .7 DNX12 (GAUZE/BANDAGES/DRESSINGS) ×2 IMPLANT
DRAIN CHANNEL 19F RND (DRAIN) ×2 IMPLANT
DRAPE UTILITY XL STRL (DRAPES) ×1 IMPLANT
DRSG TEGADERM 4X4.75 (GAUZE/BANDAGES/DRESSINGS) ×2 IMPLANT
ELECTRODE BLDE 4.0 EZ CLN MEGD (MISCELLANEOUS) ×1 IMPLANT
ELECTRODE REM PT RTRN 9FT ADLT (ELECTROSURGICAL) ×2 IMPLANT
EVACUATOR SILICONE 100CC (DRAIN) ×2 IMPLANT
GAUZE PAD ABD 8X10 STRL (GAUZE/BANDAGES/DRESSINGS) ×2 IMPLANT
GAUZE SPONGE 2X2 STRL 8-PLY (GAUZE/BANDAGES/DRESSINGS) ×2 IMPLANT
GLOVE BIO SURGEON STRL SZ 6.5 (GLOVE) IMPLANT
GLOVE BIO SURGEON STRL SZ7.5 (GLOVE) IMPLANT
GLOVE BIO SURGEON STRL SZ8 (GLOVE) ×1 IMPLANT
GLOVE BIOGEL PI IND STRL 7.0 (GLOVE) IMPLANT
GLOVE BIOGEL PI IND STRL 8 (GLOVE) ×1 IMPLANT
GOWN STRL REUS W/ TWL LRG LVL3 (GOWN DISPOSABLE) ×1 IMPLANT
GOWN STRL REUS W/TWL XL LVL3 (GOWN DISPOSABLE) ×1 IMPLANT
HEMOSTAT ARISTA ABSORB 3G PWDR (HEMOSTASIS) IMPLANT
HIBICLENS CHG 4% 4OZ BTL (MISCELLANEOUS) ×1 IMPLANT
MANIFOLD NEPTUNE II (INSTRUMENTS) ×1 IMPLANT
NDL HYPO 22X1.5 SAFETY MO (MISCELLANEOUS) ×2 IMPLANT
PACK BASIN DAY SURGERY FS (CUSTOM PROCEDURE TRAY) ×1 IMPLANT
PACK UNIVERSAL I (CUSTOM PROCEDURE TRAY) ×1 IMPLANT
PENCIL SMOKE EVACUATOR (MISCELLANEOUS) ×2 IMPLANT
PIN SAFETY STERILE (MISCELLANEOUS) ×1 IMPLANT
SLEEVE SCD COMPRESS KNEE MED (STOCKING) ×1 IMPLANT
SOLN 0.9% NACL POUR BTL 1000ML (IV SOLUTION) ×1 IMPLANT
SPONGE T-LAP 18X18 ~~LOC~~+RFID (SPONGE) ×2 IMPLANT
STAPLER INSORB 30 2030 C-SECTI (MISCELLANEOUS) IMPLANT
STAPLER SKIN PROX WIDE 3.9 (STAPLE) ×1 IMPLANT
SUT MNCRL AB 3-0 PS2 27 (SUTURE) ×4 IMPLANT
SUT MNCRL AB 4-0 PS2 18 (SUTURE) ×2 IMPLANT
SUT PDS AB 2-0 CT1 27 (SUTURE) ×2 IMPLANT
SUT PROLENE 0 CT 2 (SUTURE) IMPLANT
SUT SILK 2 0 SH (SUTURE) ×2 IMPLANT
SUT VIC AB 3-0 PS1 18XBRD (SUTURE) IMPLANT
SUT VIC AB 3-0 SH 27X BRD (SUTURE) ×1 IMPLANT
SUT VICRYL AB 3 0 TIES (SUTURE) IMPLANT
SYR 20ML LL LF (SYRINGE) ×2 IMPLANT
SYR BULB IRRIG 60ML STRL (SYRINGE) IMPLANT
SYR CONTROL 10ML LL (SYRINGE) ×1 IMPLANT
TOWEL GREEN STERILE FF (TOWEL DISPOSABLE) ×2 IMPLANT
TRAY DSU PREP LF (CUSTOM PROCEDURE TRAY) ×1 IMPLANT
TUBE CONNECTING 20X1/4 (TUBING) ×1 IMPLANT
UNDERPAD 30X36 HEAVY ABSORB (UNDERPADS AND DIAPERS) ×2 IMPLANT
YANKAUER SUCT BULB TIP NO VENT (SUCTIONS) ×1 IMPLANT

## 2024-06-30 NOTE — Anesthesia Preprocedure Evaluation (Addendum)
 Anesthesia Evaluation  Patient identified by MRN, date of birth, ID band Patient awake    Reviewed: Allergy & Precautions, NPO status , Patient's Chart, lab work & pertinent test results  Airway Mallampati: I  TM Distance: >3 FB Neck ROM: Full    Dental  (+) Teeth Intact, Dental Advisory Given   Pulmonary neg pulmonary ROS   breath sounds clear to auscultation       Cardiovascular hypertension, Pt. on medications + CAD and + Cardiac Stents   Rhythm:Regular Rate:Normal     Neuro/Psych  Headaches PSYCHIATRIC DISORDERS Anxiety Depression     Neuromuscular disease    GI/Hepatic Neg liver ROS,GERD  Medicated,,  Endo/Other  negative endocrine ROS    Renal/GU negative Renal ROS     Musculoskeletal  (+) Arthritis ,    Abdominal   Peds  Hematology  (+) Blood dyscrasia, anemia   Anesthesia Other Findings   Reproductive/Obstetrics                              Anesthesia Physical Anesthesia Plan  ASA: 3  Anesthesia Plan: General   Post-op Pain Management: Tylenol  PO (pre-op)* and Toradol  IV (intra-op)*   Induction: Intravenous  PONV Risk Score and Plan: 4 or greater and Ondansetron , Dexamethasone , Midazolam  and Scopolamine  patch - Pre-op  Airway Management Planned: Oral ETT  Additional Equipment: None  Intra-op Plan:   Post-operative Plan: Extubation in OR  Informed Consent: I have reviewed the patients History and Physical, chart, labs and discussed the procedure including the risks, benefits and alternatives for the proposed anesthesia with the patient or authorized representative who has indicated his/her understanding and acceptance.     Dental advisory given  Plan Discussed with: CRNA  Anesthesia Plan Comments:          Anesthesia Quick Evaluation

## 2024-06-30 NOTE — OR Nursing (Signed)
 Pannus weighed and discarded per Dr. Waddell instruction.

## 2024-06-30 NOTE — Transfer of Care (Signed)
 Immediate Anesthesia Transfer of Care Note  Patient: [name]  Procedure(s) Performed: Procedure(s) (LRB): PANNICULECTOMY (N/A)  Patient Location: PACU  Anesthesia Type: General  Level of Consciousness: awake, oriented, sedated and patient cooperative  Airway & Oxygen Therapy: Patient Spontanous Breathing and Patient connected to face mask oxygen  Post-op Assessment: Report given to PACU RN and Post -op Vital signs reviewed and stable  Post vital signs: Reviewed and stable  Complications: No apparent anesthesia complications Last Vitals:  Vitals Value Taken Time  BP 120/72 06/30/24 16:31  Temp    Pulse 74 06/30/24 16:33  Resp 24 06/30/24 16:33  SpO2 94 % 06/30/24 16:33  Vitals shown include unfiled device data.  Last Pain:  Vitals:   06/30/24 1204  PainSc: 0-No pain         Complications: No notable events documented.

## 2024-06-30 NOTE — Discharge Instructions (Addendum)
 Please wear binder at all times Please record drain output May shower tomorrow evening No heavy lifting greater than 20 pounds, no submerging the incisions in water No driving for 24 hours or at any time while taking narcotic pain medicine Please call the office, [phone], for any questions or concerns   Post Anesthesia Home Care Instructions  Activity: Get plenty of rest for the remainder of the day. A responsible individual must stay with you for 24 hours following the procedure.  For the next 24 hours, DO NOT: -Drive a car -Advertising copywriter -Drink alcoholic beverages -Take any medication unless instructed by your physician -Make any legal decisions or sign important papers.  Meals: Start with liquid foods such as gelatin or soup. Progress to regular foods as tolerated. Avoid greasy, spicy, heavy foods. If nausea and/or vomiting occur, drink only clear liquids until the nausea and/or vomiting subsides. Call your physician if vomiting continues.  Special Instructions/Symptoms: Your throat may feel dry or sore from the anesthesia or the breathing tube placed in your throat during surgery. If this causes discomfort, gargle with warm salt water. The discomfort should disappear within 24 hours.  If you had a scopolamine  patch placed behind your ear for the management of post- operative nausea and/or vomiting:  1. The medication in the patch is effective for 72 hours, after which it should be removed.  Wrap patch in a tissue and discard in the trash. Wash hands thoroughly with soap and water. 2. You may remove the patch earlier than 72 hours if you experience unpleasant side effects which may include dry mouth, dizziness or visual disturbances. 3. Avoid touching the patch. Wash your hands with soap and water after contact with the patch.     Information for Discharge Teaching: EXPAREL  (bupivacaine  liposome injectable suspension)   Pain relief is important to your recovery. The  goal is to control your pain so you can move easier and return to your normal activities as soon as possible after your procedure. Your physician may use several types of medicines to manage pain, swelling, and more.  Your surgeon or anesthesiologist gave you EXPAREL (bupivacaine ) to help control your pain after surgery.  EXPAREL  is a local anesthetic designed to release slowly over an extended period of time to provide pain relief by numbing the tissue around the surgical site. EXPAREL  is designed to release pain medication over time and can control pain for up to 72 hours. Depending on how you respond to EXPAREL , you may require less pain medication during your recovery. EXPAREL  can help reduce or eliminate the need for opioids during the first few days after surgery when pain relief is needed the most. EXPAREL  is not an opioid and is not addictive. It does not cause sleepiness or sedation.   Important! A teal colored band has been placed on your arm with the date, time and amount of EXPAREL  you have received. Please leave this armband in place for the full 96 hours following administration, and then you may remove the band. If you return to the hospital for any reason within 96 hours following the administration of EXPAREL , the armband provides important information that your health care providers to know, and alerts them that you have received this anesthetic.    Possible side effects of EXPAREL : Temporary loss of sensation or ability to move in the area where medication was injected. Nausea, vomiting, constipation Rarely, numbness and tingling in your mouth or lips, lightheadedness, or anxiety may occur. Call your doctor right  away if you think you may be experiencing any of these sensations, or if you have other questions regarding possible side effects.  Follow all other discharge instructions given to you by your surgeon or nurse. Eat a healthy diet and drink plenty of water or other  fluids.  About my Jackson-Pratt Bulb Drain  What is a Jackson-Pratt bulb? A Jackson-Pratt is a soft, round device used to collect drainage. It is connected to a long, thin drainage catheter, which is held in place by one or two small stiches near your surgical incision site. When the bulb is squeezed, it forms a vacuum, forcing the drainage to empty into the bulb.  Emptying the Jackson-Pratt bulb- To empty the bulb: 1. Release the plug on the top of the bulb. 2. Pour the bulb's contents into a measuring container which your nurse will provide. 3. Record the time emptied and amount of drainage. Empty the drain(s) as often as your     doctor or nurse recommends.  Date                  Time                    Amount (Drain 1)                 Amount (Drain 2)  _____________________________________________________________________  _____________________________________________________________________  _____________________________________________________________________  _____________________________________________________________________  _____________________________________________________________________  _____________________________________________________________________  _____________________________________________________________________  _____________________________________________________________________  Squeezing the Jackson-Pratt Bulb- To squeeze the bulb: 1. Make sure the plug at the top of the bulb is open. 2. Squeeze the bulb tightly in your fist. You will hear air squeezing from the bulb. 3. Replace the plug while the bulb is squeezed. 4. Use a safety pin to attach the bulb to your clothing. This will keep the catheter from     pulling at the bulb insertion site.  When to call your doctor- Call your doctor if: Drain site becomes red, swollen or hot. You have a fever greater than 101 degrees F. There is oozing at the drain site. Drain falls out (apply a guaze  bandage over the drain hole and secure it with tape). Drainage increases daily not related to activity patterns. (You will usually have more drainage when you are active than when you are resting.) Drainage has a bad odor.  *May have Tylenol  today at 11pm

## 2024-06-30 NOTE — Anesthesia Procedure Notes (Signed)
 Procedure Name: Intubation Date/Time: 06/30/2024 1:33 PM  Performed by: Delayne Olam BIRCH, CRNAPre-anesthesia Checklist: Patient identified, Emergency Drugs available, Suction available and Patient being monitored Patient Re-evaluated:Patient Re-evaluated prior to induction Oxygen Delivery Method: Circle system utilized Preoxygenation: Pre-oxygenation with 100% oxygen Induction Type: IV induction Ventilation: Mask ventilation without difficulty Laryngoscope Size: Mac and 3 Grade View: Grade I Tube type: Oral Tube size: 7.0 mm Number of attempts: 1 Airway Equipment and Method: Stylet and Oral airway Placement Confirmation: ETT inserted through vocal cords under direct vision, positive ETCO2 and breath sounds checked- equal and bilateral Secured at: 22 cm Tube secured with: Tape Dental Injury: Teeth and Oropharynx as per pre-operative assessment

## 2024-06-30 NOTE — Op Note (Signed)
 DATE OF OPERATION: 06/30/2024  LOCATION: [facility] operating Room  PREOPERATIVE DIAGNOSIS: Panniculitis  POSTOPERATIVE DIAGNOSIS: Same  PROCEDURE: Panniculectomy  SURGEON: Marinell Birmingham, MD  ASSISTANT: Estefana Peck  EBL: 75 cc  CONDITION: Stable  COMPLICATIONS: None  INDICATION: The patient, [name], is a 48 y.o. female born on [birth year], is here for treatment of ongoing rashes and pain on the posterior aspect of the pannus and in the intertriginous regions.SABRA   PROCEDURE DETAILS:  The patient was seen prior to surgery and marked.  The IV antibiotics were given. The patient was taken to the operating room and given a general anesthetic. A standard time out was performed and all information was confirmed by those in the room. SCDs were placed.   The abdomen was prepped and draped in usual sterile manner.  A transverse incision was made across the lower portion of the abdomen sharply and electrocautery was used to dissect the soft tissues down to the intra-abdominal wall.  The skin and fat was lifted from the anterior abdominal wall in a caudad to cranial manner to the level of the umbilicus.  The portion of skin and fat to be removed was identified and a counterincision was made on the upper portion of the pannus and the tissue removed.  The wound was inspected for bleeding and meticulous hemostasis was achieved with the electrocautery.  I also elected to place TXA soaked gauze on the surgical surfaces and this was allowed to dwell for 5 minutes.  The subfascial space over the rectus muscles and the external oblique as well as the subcutaneous tissues were infiltrated with 100 mL of a mixture of Exparel , plain Marcaine  and saline.  The wound was then irrigated with warm normal saline and a Valsalva was held with no additional bleeding identified.  219 French round drains were then placed in the surgical bed and brought out through separate stab incisions.  The skin edges were  tailor tacked in place with skin clips and Scarpa's fascia approximated with interrupted 2-0 PDS sutures.  The dermis was closed with 3-0 Monocryl sutures and the skin closed with a running 4-0 Monocryl subcuticular stitch.  The incisions were sealed with Dermabond and the patient was placed in a compressive garment and awakened from anesthesia.  She was transferred to the recovery room in good condition.  All instrument needle and sponge counts were reported as correct and there were no complications appreciated during the procedure. The patient was allowed to wake up and taken to recovery room in stable condition at the end of the case. The family was notified at the end of the case.   The advanced practice practitioner (APP) assisted throughout the case.  The APP was essential in retraction and counter traction when needed to make the case progress smoothly.  This retraction and assistance made it possible to see the tissue plans for the procedure.  The assistance was needed for blood control, tissue re-approximation and assisted with closure of the incision site.

## 2024-06-30 NOTE — Interval H&P Note (Signed)
 History and Physical Interval Note: No change in exam or indication for surgery All questions answered Lower abdomen marked with her concurrence Will proceed at her request  06/30/2024 12:32 PM  Mija J Laplante  has presented today for surgery, with the diagnosis of m79.3.  The various methods of treatment have been discussed with the patient and family. After consideration of risks, benefits and other options for treatment, the patient has consented to  Procedure(s): PANNICULECTOMY (N/A) as a surgical intervention.  The patient's history has been reviewed, patient examined, no change in status, stable for surgery.  I have reviewed the patient's chart and labs.  Questions were answered to the patient's satisfaction.     Leonce KATHEE Birmingham

## 2024-07-01 ENCOUNTER — Encounter (HOSPITAL_BASED_OUTPATIENT_CLINIC_OR_DEPARTMENT_OTHER): Payer: Self-pay | Admitting: Plastic Surgery

## 2024-07-01 NOTE — Anesthesia Postprocedure Evaluation (Signed)
 Anesthesia Post Note  Patient: [name]  Procedure(s) Performed: PANNICULECTOMY (Abdomen)     Patient location during evaluation: PACU Anesthesia Type: General Level of consciousness: awake and alert Pain management: pain level controlled Vital Signs Assessment: post-procedure vital signs reviewed and stable Respiratory status: spontaneous breathing, nonlabored ventilation, respiratory function stable and patient connected to nasal cannula oxygen Cardiovascular status: blood pressure returned to baseline and stable Postop Assessment: no apparent nausea or vomiting Anesthetic complications: no   No notable events documented.  Last Vitals:  Vitals:   06/30/24 1745 06/30/24 1845  BP: 117/64 120/68  Pulse: 83 87  Resp: 16 17  Temp:  (!) 36.3 C  SpO2: 94% 95%    Last Pain:  Vitals:   06/30/24 1845  TempSrc: Temporal  PainSc:                  [name]

## 2024-07-07 ENCOUNTER — Ambulatory Visit: Admitting: Plastic Surgery

## 2024-07-07 VITALS — BP 138/80 | HR 72

## 2024-07-07 DIAGNOSIS — Z9889 Other specified postprocedural states: Secondary | ICD-10-CM

## 2024-07-08 NOTE — Progress Notes (Signed)
 [name] returns 1 week postop from a panniculectomy.  She is doing well but she does note some pain along her incision especially on the right.  Tolerating a diet.  Bowel movements have been slow to return.  No fevers or chills.  Drain output is well under 30 mL out of the right drain.  Left drain is still above 30 mL  Right drain is removed without difficulty.  The incision is somewhat moist and there is a little bit of excoriation along the incision on the right side.  The incision is intact.  There is no wound separation.  There is no significant erythema.  I have asked her to keep gauze over this area to keep the area dry and to make sure that she is washing this area when she showers.  Keep scheduled appointment.  Follow-up sooner if needed.  May return once drain output falls below 30 mL/day for 2 days in a row.

## 2024-07-10 ENCOUNTER — Ambulatory Visit: Admitting: Student

## 2024-07-10 VITALS — BP 125/80 | HR 68

## 2024-07-10 DIAGNOSIS — Z9889 Other specified postprocedural states: Secondary | ICD-10-CM

## 2024-07-10 MED ORDER — OXYCODONE HCL 5 MG PO TABS: 5.0000 mg | ORAL_TABLET | Freq: Four times a day (QID) | ORAL | 0 refills | Status: DC | PRN

## 2024-07-10 MED ORDER — DOXYCYCLINE HYCLATE 100 MG PO TABS: 100.0000 mg | ORAL_TABLET | Freq: Two times a day (BID) | ORAL | 0 refills | Status: AC

## 2024-07-10 NOTE — Progress Notes (Signed)
 Patient is a 48 year old female who recently underwent panniculectomy with Dr. Waddell on 06/30/2024.  Patient is 10 days postop.  She presents to the clinic today for postoperative follow-up.  Patient was last seen in the clinic on 07/07/2024.  At this visit, patient was doing well.  Drain output was well under 30 cc out from the right drain.  Left drain was still above 30 cc.  Right drain was removed without any difficulty.  Incision was somewhat moist and there was a little bit of excoriation along the incision on the right side.  The incision was intact.  There is no wound separation.  It was discussed with the patient to keep gauze over the area of excoriation to keep the area dry.  Today, patient reports she is doing overall well.  She states that she has had some pain to the central abdomen.  She states that it comes and goes.  She does not report any nausea or vomiting.  She denies any fevers or chills.  She reports that she also has some redness to the lateral aspects of her incision.  She states that these areas are itchy.  She denies any other issues or concerns.  She reports that her drain output has been about 20 cc per 24-hour period over the past few days.  Chaperone present on exam.  On exam, patient is sitting upright in no acute distress.  Abdomen is soft.  It is mildly tender to palpation centrally and out laterally.  There is no obvious fluid collection palpated on exam.  There is some erythema noted to the lateral aspects of the incisions bilaterally.  There is residual Dermabond noted out to the lateral aspects of the incision, there does not appear to be residual Dermabond noted centrally.  There is no crepitus noted on exam.  Incision overall appears to be clean dry and intact.  Left JP drain is in place and functioning with serosanguineous drainage in the bulb.  Discussed with the patient that the redness that she is experiencing out laterally may be a reaction to the Dermabond.  We  also discussed that infection cannot be ruled out.  I recommended that she apply Vaseline over the lateral aspects of the incision to help work off the Dermabond if this is a reaction to the Dermabond.  I discussed with her that she may apply Benadryl  ointment to the skin near the lateral incisions.  Discussed with her that if she is having itching, she may take Benadryl  at night or an antihistamine such as Allegra during the day.  Also recommended though that we start her on an antibiotic in the event that this is cellulitis.  Patient was in agreement with the plan.  Recommended that patient take Tylenol  and ibuprofen for her pain.  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  Discussed with her I will send her in a few more pain pills.  Discussed with her to not take Benadryl  at the same time as pain pills as these can be sedating.  Patient expressed understanding.  Recommended that we hold off on drain pull for now given exam.  Discussed with her that when this resolves, we may consider drain pull.  She expressed understanding.  Discussed with patient to continue with compression at all times and avoid strenuous activities.  I discussed with the patient to closely monitor the area.  Discussed with her that if she notices any worsening redness, fevers, chills,  pain, swelling or has any concerning symptoms she needs to let us  know right away.  Patient expressed understanding.  Patient to follow back up in the clinic early next week.  Pictures were obtained of the patient and placed in the chart with the patient's or guardian's permission.   Discussed today's visit with Dr. Waddell and he is in agreement with the plan.

## 2024-07-14 ENCOUNTER — Encounter: Admitting: Student

## 2024-07-14 ENCOUNTER — Encounter: Payer: Self-pay | Admitting: Student

## 2024-07-14 VITALS — BP 148/92 | HR 95 | Ht 59.0 in | Wt 175.6 lb

## 2024-07-14 DIAGNOSIS — Z9889 Other specified postprocedural states: Secondary | ICD-10-CM

## 2024-07-14 MED ORDER — GABAPENTIN 300 MG PO CAPS: 300.0000 mg | ORAL_CAPSULE | Freq: Three times a day (TID) | ORAL | 0 refills | Status: AC

## 2024-07-14 NOTE — Progress Notes (Signed)
 Patient is a pleasant 48 year old female s/p panniculectomy performed 06/30/2024 by Dr. Waddell who presents to clinic for postoperative follow-up.  She was last seen here in clinic on 07/10/2024.  Right sided drain was previously removed 07/07/2024.  At most recent visit, patient stated that she was having pain that comes and goes.  She also had some areas of erythema out laterally that were reportedly itchy.  Thought to be related to the Dermabond.  She was encouraged to take an antihistamine and then was also started on an antibiotic to help cover for early cellulitis.  Decision was made to hold off from left drain removal given her pain despite low volume.  Follow-up as scheduled.  Today, patient was tearful.  She describes significant pain just right to the midline deep to the incision.  She states that it is intermittent, approximately 3 episodes per day lasting 5 to 10 minutes.  She is companied by family at bedside.  She adamantly denies any difficulty breathing, fevers, leg swelling, or other concerns.  On exam, abdomen is soft and nondistended.  Incision CDI throughout.  Mild amount of residual Dermabond.  No erythema or induration in the area that patient describes pain.  Appears to be healing appropriately.  Discussed case with Dr. Waddell who also was able to personally evaluate patient.  Her volume output from the left side has been approximately 10 cc/day.  Suspected that possibly it is contributing to her discomfort.  Drain is removed without complication or difficulty.  It almost sounds neuropathic given that it comes and goes without any obvious aggravating factors.  Only lasts upwards of 10 minutes before spontaneously resolving.  Will start her on gabapentin .  Follow-up in 1 week for reevaluation, sooner if needed.

## 2024-07-18 NOTE — Progress Notes (Signed)
 Patient is a pleasant 48 year old female s/p panniculectomy performed 06/30/2024 by Dr. Waddell who presents to clinic for postoperative follow-up.   She was last seen here in clinic on 07/14/2024.  At that time, she was tearful and complaining of significant pain just to the right of the midline, deep to the incision.  She states that is intermittent, approximately 3 episodes per day lasting 5 to 10 minutes each.  Exam is entirely benign.  Dr. Waddell personally evaluated patient and agrees that this may be neuropathic versus irritation from drain.  Drain removed without complication or difficulty due to low output.  Follow-up in 1 week.  Patient prescribed gabapentin .  Today, patient is doing well.  She is accompanied by her daughter at bedside.  She states that her pain has improved since last encounter.  She now only has 1 episode a day approximately 5 minutes duration.  She states that the pain is not controlled with Tylenol .  She is wondering if something else can be provided.  She cannot take NSAIDs due to previous bariatric surgery.  Patient denies any swelling since the removal of her drain last week.  She has not been applying any Vaseline or other petroleum products to the incisions.  On exam, abdomen is soft and nondistended.  No palpable areas of firmness or underlying fluid.  No significant TTP or guarding.  Mildly tender left lateral and right inguinal regions.  No pertinent exam findings at those locations.  Incision CDI throughout.  Mild incisional separation (1 to 2 mm), 2 cm at length, just right of the midline.  Otherwise, exam benign.  Will prescribe a few extra oxycodone  for her to have if absolutely needed during her episodes of extreme discomfort.  Otherwise, suspect that will continue to improve.  She understands that if this becomes an ongoing issue, she may need to be referred to pain management.  However, it has already improved considerably since last encounter on the  gabapentin .  Follow-up in 2 to 3 weeks, sooner if needed.  Will likely obtain final postoperative photos at that time.  Vaseline to the incisions throughout to help with the moderate amount of residual overlying Dermabond.

## 2024-07-21 ENCOUNTER — Ambulatory Visit: Admitting: Physician Assistant

## 2024-07-21 VITALS — BP 154/87 | HR 101

## 2024-07-21 DIAGNOSIS — Z9889 Other specified postprocedural states: Secondary | ICD-10-CM

## 2024-07-21 MED ORDER — OXYCODONE HCL 5 MG PO TABS: 5.0000 mg | ORAL_TABLET | Freq: Three times a day (TID) | ORAL | 0 refills | Status: AC | PRN

## 2024-07-23 ENCOUNTER — Telehealth: Payer: Self-pay | Admitting: Physician Assistant

## 2024-07-23 NOTE — Telephone Encounter (Signed)
 Spoke with daughter, Laneta, who states that the incisional wound noted earlier this week is a bit larger, maybe an inch in size.  She states that it also now looks deeper and can see white-ish material.  Suspect that some of the incisional slough is melting away. Patient is provided reassurance that in these cases the wound will often start to look worse before looking better.  Keep clean and covered.  Call if she notices any worsening redness, swelling, pain, continued widening, or if she starts feeling poorly / fevers.  Happy to move up her next post-op visit if there are concerns.  She voices understanding and is agreeable to plan.

## 2024-07-23 NOTE — Telephone Encounter (Signed)
 panniculectomy/ins/sx/12-8/[facility]/1:00pm Pt daughter calling saying her incision has opened up more and wants to know what to do, please advise patient

## 2024-07-28 ENCOUNTER — Telehealth: Payer: Self-pay | Admitting: Plastic Surgery

## 2024-07-28 ENCOUNTER — Encounter: Payer: Self-pay | Admitting: Physician Assistant

## 2024-07-28 VITALS — BP 135/84 | HR 95 | Ht 59.0 in | Wt 175.0 lb

## 2024-07-28 DIAGNOSIS — Z9889 Other specified postprocedural states: Secondary | ICD-10-CM

## 2024-07-28 NOTE — Telephone Encounter (Signed)
 Pt stated that she noticed there are 2 open spots on her incision and she is having yellow discharge coming from them now.  She wants to know if she needs to be seen.  Please advise.

## 2024-07-28 NOTE — Progress Notes (Signed)
 Patient is a pleasant 49 year old female s/p panniculectomy performed 06/30/2024 by Dr. Waddell who presents to clinic for postoperative follow-up.   She was last seen here in clinic on 07/21/2024.  At that time, the pain that she had previously been experiencing had improved significantly after removal of drain and initiation of gabapentin .  There was a mild area of incisional separation, approximately 1 to 2 mm wide, 2 cm long.  This was just right at the midline.  Otherwise her exam was entirely benign.   She then messaged the office a couple of days later reporting that the incisional wound had widened.  Patient's daughter then messaged the office again today stating that there are now a couple areas of separation.  Today, patient states that she has noticed some continued separation and wanted to ensure that everything looked okay.  She still has some tenderness in the right inguinal region, but no significant abdominal pain.  She still averages a single episode of neuropathic discomfort lasting approximately 5 minutes each day, largely unchanged from last encounter.  She does not smoke or use nicotine containing products.  She does report that her husband smokes cigarettes.  She is focusing on good nutrition and is supplementing her diet with protein in an effort to encourage healing.  On exam, abdomen is soft and nondistended.  Her incision does have various incisional wounds.  Just right of the midline her incision now measures 8 x 0.75 x 0.5 cm.  Base of wound fully explored.  Left laterally she has an additional wound measuring 6 x 1 x 0.5 cm.  On the most lateral aspect of right panniculectomy incision there is an additional 2 cm incisional wound.  See images provided in chart.  No surrounding erythema or induration.  No palpable underlying fluid collections.  No significant drainage appreciated on exam.  Placed calcium  alginate over her incisional wounds and recommending continued placement of  ABD pad or feminine pad for drainage.  Continue with activity modifications and compressive garments.  She understands that these wounds will need to heal from the bottom up.  Continue to avoid any vigorous activity or exercise.  Follow-up next week, sooner if needed.  Discussed return precautions.  Picture(s) obtained of the patient and placed in the chart were with the patient's or guardian's permission.

## 2024-07-31 ENCOUNTER — Ambulatory Visit (INDEPENDENT_AMBULATORY_CARE_PROVIDER_SITE_OTHER): Admitting: Student

## 2024-07-31 DIAGNOSIS — Z9889 Other specified postprocedural states: Secondary | ICD-10-CM

## 2024-07-31 NOTE — Progress Notes (Signed)
 Patient is a 49 year old female who underwent panniculectomy with Dr. Waddell on 06/30/2024.  Patient is a little over 4 weeks postop.  She presents to the clinic today with concerns about wounds to her incision.  Patient was last seen in the clinic on 07/28/2024.  At this visit, patient reported that she had noticed continued separation of her incision.  On exam, abdomen was soft and nondistended.  Her incision had various incisional wounds.  Just right of midline there was an 8 x 0.75 x 0.5 cm wound.  Left laterally, patient had a wound that was measuring 6 x 1 x 0.5 cm.  On the lateral aspect of the right panniculectomy incision there was a 2 cm incisional wound.  Calcium  alginate was placed over the wounds and it was recommended that patient continue with placement of ABD pad for drainage.  Today, patient reports she is doing well.  She states that she noticed some bleeding from the left lateral wound of her incision.  She reports that she had to change her dressings 4 times yesterday and changed at 1 time last night.  She denies any dizziness.  She states from time to time she does feel little bit lightheaded.  She denies any chest pain or shortness of breath.  She reports that the wound especially to the left side is painful at times.  Patient denies any other issues or concerns at this time.  Chaperone present on exam.  On exam, patient is sitting upright in no acute distress.  Abdomen is soft and nontender.  There is no overlying erythema, no obvious fluid collections on exam.  There are several incisional wounds noted throughout the inferior abdominal incision.  Wound most laterally is approximately 7 cm x 2 cm.  There is healthy granulation tissue in the wound with some slough noted as well.  Centrally, wound is approximately 7.5 cm x 0.5 cm with healthy granulation tissue throughout the wound.  To the right lateral incision there appears to be a 2 x 2.5 cm wound that is superficial.  There are no signs  of infection on exam.  No active bleeding or drainage noted on exam.  Discussed with the patient given the healthy tissue, little bleeding from the wound is expected.  Recommended that she clean the wound with Vashe and apply Urgo clean dressing over the wounds every other day.  Discussed with her that when she runs out of the Urgo clean, she may transition back to the dressing changes she was doing before or transition to Xeroform daily over the wounds.  Patient expressed understanding.  Recommended that she apply Vaseline or Aquaphor throughout the remainder of her incisions.  Recommended that patient take Tylenol  or ibuprofen for her pain associated with the wound.  Patient expressed understanding.  Recommended that the patient make sure that she is hydrating adequately.  Discussed with her that she may want to consider adding on sugar-free Gatorade or sugar-free liquid IV.  I also recommend that she follow-up with her PCP in regards to lightheadedness.  I discussed with her that if it happens again and/or if it persist she needs to go to the emergency room.  Patient expressed understanding.  Discussed with the patient to continue with compression at all times.  Recommend that patient follow-up in 1 to 2 weeks.  Instructed her to call in the meantime with any questions or concerns.  Dr. Waddell also had the opportunity to evaluate the patient today.

## 2024-08-05 ENCOUNTER — Ambulatory Visit: Admitting: Plastic Surgery

## 2024-08-05 VITALS — BP 141/90 | HR 80 | Temp 97.6°F

## 2024-08-05 DIAGNOSIS — Z9889 Other specified postprocedural states: Secondary | ICD-10-CM

## 2024-08-05 MED ORDER — TRAMADOL HCL 50 MG PO TABS: 50.0000 mg | ORAL_TABLET | Freq: Two times a day (BID) | ORAL | 0 refills | Status: AC | PRN

## 2024-08-05 NOTE — Progress Notes (Signed)
 Ms. Leys is approximately 5 weeks from a panniculectomy.  She has done well overall but she did have some superficial areas of wound separation.  She called yesterday with increased pain on the area of separation on the left lateral portion of the incision.  I asked her to return if I could evaluate  The wounds are all clean everything is granulating.  There is no sign of infection there is no erythema around the incisions there is no drainage from the incisions.  I reassured her that I do not see any ongoing problems.  I believe all of these areas of superficial separation will heal without significant cosmetic consequences.  She will continue dressing changes as she has been doing.  I will add some as needed tramadol  to help with her discomfort.  She will follow-up next week.

## 2024-08-07 ENCOUNTER — Encounter: Admitting: Student

## 2024-08-08 ENCOUNTER — Encounter: Admitting: Student

## 2024-08-13 ENCOUNTER — Ambulatory Visit
Admission: RE | Admit: 2024-08-13 | Discharge: 2024-08-13 | Disposition: A | Source: Ambulatory Visit | Attending: Nurse Practitioner | Admitting: Nurse Practitioner

## 2024-08-13 ENCOUNTER — Ambulatory Visit: Admitting: Nurse Practitioner

## 2024-08-13 ENCOUNTER — Ambulatory Visit: Admitting: Plastic Surgery

## 2024-08-13 ENCOUNTER — Other Ambulatory Visit: Payer: Self-pay | Admitting: Nurse Practitioner

## 2024-08-13 ENCOUNTER — Encounter: Payer: Self-pay | Admitting: Nurse Practitioner

## 2024-08-13 ENCOUNTER — Encounter: Payer: Self-pay | Admitting: Plastic Surgery

## 2024-08-13 VITALS — BP 142/94 | HR 73 | Ht 59.0 in | Wt 175.2 lb

## 2024-08-13 VITALS — BP 130/80 | HR 85 | Temp 98.4°F | Ht 59.0 in | Wt 177.4 lb

## 2024-08-13 DIAGNOSIS — M25551 Pain in right hip: Secondary | ICD-10-CM

## 2024-08-13 DIAGNOSIS — E6609 Other obesity due to excess calories: Secondary | ICD-10-CM | POA: Diagnosis not present

## 2024-08-13 DIAGNOSIS — R7612 Nonspecific reaction to cell mediated immunity measurement of gamma interferon antigen response without active tuberculosis: Secondary | ICD-10-CM | POA: Diagnosis not present

## 2024-08-13 DIAGNOSIS — Z87442 Personal history of urinary calculi: Secondary | ICD-10-CM

## 2024-08-13 DIAGNOSIS — R5383 Other fatigue: Secondary | ICD-10-CM

## 2024-08-13 DIAGNOSIS — E66812 Obesity, class 2: Secondary | ICD-10-CM | POA: Diagnosis not present

## 2024-08-13 DIAGNOSIS — M542 Cervicalgia: Secondary | ICD-10-CM | POA: Diagnosis not present

## 2024-08-13 DIAGNOSIS — R2241 Localized swelling, mass and lump, right lower limb: Secondary | ICD-10-CM

## 2024-08-13 DIAGNOSIS — R319 Hematuria, unspecified: Secondary | ICD-10-CM

## 2024-08-13 DIAGNOSIS — Z6835 Body mass index (BMI) 35.0-35.9, adult: Secondary | ICD-10-CM

## 2024-08-13 DIAGNOSIS — R10A1 Flank pain, right side: Secondary | ICD-10-CM

## 2024-08-13 DIAGNOSIS — G8929 Other chronic pain: Secondary | ICD-10-CM

## 2024-08-13 DIAGNOSIS — Z9889 Other specified postprocedural states: Secondary | ICD-10-CM

## 2024-08-13 DIAGNOSIS — R52 Pain, unspecified: Secondary | ICD-10-CM | POA: Insufficient documentation

## 2024-08-13 DIAGNOSIS — M79604 Pain in right leg: Secondary | ICD-10-CM

## 2024-08-13 DIAGNOSIS — R3129 Other microscopic hematuria: Secondary | ICD-10-CM

## 2024-08-13 LAB — POCT URINALYSIS DIP (CLINITEK)
Glucose, UA: NEGATIVE mg/dL
Ketones, POC UA: NEGATIVE mg/dL
Leukocytes, UA: NEGATIVE
Nitrite, UA: NEGATIVE
POC PROTEIN,UA: 100 — AB
Spec Grav, UA: >=1.03 — AB
Urobilinogen, UA: 0.2 U/dL
pH, UA: 6

## 2024-08-13 MED ORDER — TRAMADOL HCL 50 MG PO TABS: ORAL_TABLET | ORAL | 0 refills | Status: AC

## 2024-08-13 NOTE — Progress Notes (Signed)
 LILLETTE [name] Gladis, CMA,acting as a neurosurgeon for Emily Ada, FNP.,have documented all relevant documentation on the behalf of Emily Ada, FNP,as directed by  Emily Ada, FNP while in the presence of Emily Ada, FNP.  Subjective:  Patient ID: [name] Code , female    DOB: 04/21/1976 , 49 y.o.   MRN: [id number]  Chief Complaint  Patient presents with   Neck Pain    Patient presents today for not feeling well. Patient reports she is having neck and leg pain. She reports her whole body hurts. She reports about a year and half ago she had gastric bypass and about 6 weeks ago she had panniculectomy. She reports her incision from the panniculectomy opened in 4 spots but her surgeon is handling it. She reports since her panniculectomy she has bad the body pain.    Flank Pain    She reports flank pain started yesterday.      She was to have an MRI of her neck but was denied due to insurance. She did not have any injections to her neck. Has not followed up. Right leg pain which is waking her up at night, has been a long time but has worsened in the last 3 weeks. Describes as an aching. She has tried taking tylenol  which has been ineffective so she stopped taking.  Rates pain 7-8/10. Pain is always present but worse at night time for her leg and her neck the pain is present all the time. She is having pain to her right side of her abdomen for the last 6 months intermittently. Dull pain which is worse when she takes in a deep breath.  She has been having fatigue since having her panniculectomy. She has not been to work since December 8th. She is to f/u with Surgery on 08/27/24    Discussed the use of AI scribe software for clinical note transcription with the patient, who gave verbal consent to proceed.  History of Present Illness [name] is a 49 year old female who presents with body pain, particularly in the neck and right leg, following recent surgery.  She experiences persistent neck pain,  similar to previous episodes, and reports a history of advanced degenerative disc disease shown on a previous x-ray. An MRI has not been performed due to insurance issues, and previous physical therapy did not provide significant relief.  The right leg pain has worsened over the past three to four weeks, described as an aching pain that radiates down the leg and disrupts her sleep. She rates the pain as a seven or eight out of ten, noting it is worse at night. Tylenol  has been ineffective for relief.  She describes intermittent dull pain in another area of her body, occurring for about six months, lasting about a week at a time, and exacerbated by deep breathing. Additionally, she experiences fatigue, which she attributes to her recent panniculectomy, and has not returned to work due to surgical complications.  She reports a history of a positive TB test two years ago, which was later found to be negative. No increase in urination frequency, burning, or hesitancy, but a change in urination flow is noted. She experiences intermittent headaches but does not take medication for them.  She has been trying to stay active by walking outside despite her discomfort. She notes a small bump on her leg, described as a 'little knot'. Her current medications include extra strength Tylenol , which she finds ineffective for her pain.  Past Medical  History:  Diagnosis Date   Anemia 12/26/2022   Anxiety    Arthritis    Blood transfusion without reported diagnosis 12/26/2022   Cancer (HCC)    ovarian cancer   Carpal tunnel syndrome of right wrist 11/21/2013   Coronary artery disease    Depression    Ganglion cyst of wrist 11/21/2013   right   Hematochezia 01/18/2023   Hypertension    Migraines    Pelvic mass 04/11/2021   Right tennis elbow 11/21/2013     Family History  Problem Relation Age of Onset   Hypertension Mother    Diabetes Mother    Cancer Mother    Arthritis Mother    Asthma Father    Anemia  Sister    Diabetes Brother    Hypertension Brother    Diabetes Brother    Hypertension Maternal Grandmother    Heart attack Paternal Grandmother    Cancer Other    Heart disease Maternal Uncle    Breast cancer Neg Hx     Current Medications[1]   Allergies[2]   Review of Systems  Constitutional: Negative.   HENT: Negative.    Respiratory: Negative.    Cardiovascular: Negative.   Genitourinary:  Positive for flank pain. Negative for frequency and pelvic pain.       Urinary hesitancy  Neurological:  Positive for headaches. Negative for dizziness.  Psychiatric/Behavioral: Negative.       Today's Vitals   08/13/24 1437  BP: 130/80  Pulse: 85  Temp: 98.4 F (36.9 C)  TempSrc: Oral  Weight: 177 lb 6.4 oz (80.5 kg)  Height: 4' 11 (1.499 m)  PainSc: 7   PainLoc: Neck   Body mass index is 35.83 kg/m.  Wt Readings from Last 3 Encounters:  08/13/24 177 lb 6.4 oz (80.5 kg)  08/13/24 175 lb 3.2 oz (79.5 kg)  07/28/24 175 lb (79.4 kg)      Objective:  Physical Exam Vitals and nursing note reviewed.  Constitutional:      General: She is not in acute distress.    Appearance: Normal appearance. She is obese.  Cardiovascular:     Rate and Rhythm: Normal rate and regular rhythm.     Pulses: Normal pulses.     Heart sounds: Normal heart sounds. No murmur heard. Pulmonary:     Effort: Pulmonary effort is normal. No respiratory distress.     Breath sounds: Normal breath sounds. No wheezing.  Musculoskeletal:        General: Tenderness (right thigh tenderness) present.  Skin:    General: Skin is warm and dry.     Capillary Refill: Capillary refill takes less than 2 seconds.  Neurological:     General: No focal deficit present.     Mental Status: She is alert and oriented to person, place, and time.     Cranial Nerves: No cranial nerve deficit.     Motor: No weakness.  Psychiatric:        Mood and Affect: Mood normal.        Behavior: Behavior normal.        Thought  Content: Thought content normal.        Judgment: Judgment normal.         Assessment And Plan:   Assessment & Plan Body aches Encouraged to drink bone broth Chronic neck pain  Chronic neck pain with advanced degenerative disc disease confirmed by x-ray. Pain constant, rated 7-8/10. MRI not approved. Physical therapy.  - Continue Tylenol  as  needed for pain. Right leg pain  Right flank pain Tenderness on palpation, seems to be muscular in nature however will send a urine culture  Positive QuantiFERON-TB Gold test Previous positive TB test with subsequent negative results. Symptoms do not indicate active TB, latent TB remains a concern. - Referred to infectious disease specialist for latent TB evaluation. - Ordered chest x-ray to assess for active TB signs. Previous chest xray was negative Other fatigue Persistent fatigue post-panniculectomy. Possible post-surgical recovery, thyroid dysfunction, vitamin B12 deficiency, or anemia. - Ordered labs for thyroid function, vitamin B12, and iron levels. Mass of right thigh Firm mass noted to anterior thigh will check ultrasound especially due to having leg pain. Right hip pain Right hip and leg pain with palpable right thigh mass. Pain rated 7-8/10, worsens at night. Differential includes arthritis or muscle knot. - Ordered right hip x-ray for arthritis evaluation. - Ordered ultrasound of right thigh to assess mass. - Prescribed tramadol  for pain, caution advised due to gastric bypass. Hematuria, unspecified type Will check renal ultrasound due to history of kidney stones Class 2 obesity due to excess calories with body mass index (BMI) of 35.0 to 35.9 in adult, unspecified whether serious comorbidity present She is encouraged to strive for BMI less than 30 to decrease cardiac risk. Advised to aim for at least 150 minutes of exercise per week.   Orders Placed This Encounter  Procedures   Culture, Urine   DG Chest 2 View   DG Hip  Unilat W OR W/O Pelvis 2-3 Views Right   US  SOFT TISSUE LOWER EXTREMITY LIMITED RIGHT (NON-VASCULAR)   Iron, TIBC and Ferritin Panel   CBC with Differential/Platelet   TSH   Vitamin B12   Ambulatory referral to Infectious Disease   Ambulatory referral to Physical Therapy   POCT URINALYSIS DIP (CLINITEK)     Return for keep same next.  Patient was given opportunity to ask questions. Patient verbalized understanding of the plan and was able to repeat key elements of the plan. All questions were answered to their satisfaction.    LILLETTE Emily Ada, FNP, have reviewed all documentation for this visit. The documentation on 08/13/24 for the exam, diagnosis, procedures, and orders are all accurate and complete  IF YOU HAVE BEEN REFERRED TO A SPECIALIST, IT MAY TAKE 1-2 WEEKS TO SCHEDULE/PROCESS THE REFERRAL. IF YOU HAVE NOT HEARD FROM US /SPECIALIST IN TWO WEEKS, PLEASE GIVE US  A CALL AT [phone].      [1]  Current Outpatient Medications:    amLODipine  (NORVASC ) 10 MG tablet, Take 1 tablet (10 mg total) by mouth daily., Disp: 90 tablet, Rfl: 0   atorvastatin  (LIPITOR) 40 MG tablet, Take 1 tablet (40 mg total) by mouth daily., Disp: 90 tablet, Rfl: 1   cyclobenzaprine  (FLEXERIL ) 5 MG tablet, Take 1 tablet (5 mg total) by mouth 3 (three) times daily as needed for muscle spasms., Disp: 30 tablet, Rfl: 0   EPINEPHrine  0.3 mg/0.3 mL IJ SOAJ injection, Inject 0.3 mg into the muscle as needed for anaphylaxis., Disp: 1 each, Rfl: 2   gabapentin  (NEURONTIN ) 300 MG capsule, Take 1 capsule (300 mg total) by mouth 3 (three) times daily., Disp: 30 capsule, Rfl: 0   hydrOXYzine (ATARAX) 10 MG tablet, 1-2 tabs by mouth every 4-6 hours as needed for itching, Disp: , Rfl:    Multiple Vitamins-Minerals (BARIATRIC MULTIVITAMINS PO), Take by mouth., Disp: , Rfl:    ondansetron  (ZOFRAN -ODT) 4 MG disintegrating tablet, Take 1 tablet (4 mg  total) by mouth every 8 (eight) hours as needed for nausea or  vomiting., Disp: 20 tablet, Rfl: 0   pantoprazole  (PROTONIX ) 40 MG tablet, Take 1 tablet (40 mg total) by mouth daily., Disp: 90 tablet, Rfl: 1   tirzepatide (MOUNJARO) 2.5 MG/0.5ML Pen, Inject 2.5 mg into the skin once a week., Disp: , Rfl:    traMADol  (ULTRAM ) 50 MG tablet, Take 1/2 tablet by mouth as needed for pain every 8 hours, Disp: 20 tablet, Rfl: 0   Vitamin D , Ergocalciferol , (DRISDOL ) 1.25 MG (50000 UNIT) CAPS capsule, Take 1 capsule (50,000 Units total) by mouth 2 (two) times a week., Disp: 24 capsule, Rfl: 1 [2]  Allergies Allergen Reactions   Venlafaxine Anaphylaxis and Nausea Only    Insomnia; Depression Insomnia; Depression

## 2024-08-13 NOTE — Assessment & Plan Note (Addendum)
 Will check renal ultrasound due to history of kidney stones

## 2024-08-13 NOTE — Assessment & Plan Note (Addendum)
"   Chronic neck pain with advanced degenerative disc disease confirmed by x-ray. Pain constant, rated 7-8/10. MRI not approved. Physical therapy.  - Continue Tylenol  as needed for pain. "

## 2024-08-13 NOTE — Progress Notes (Signed)
 [name] returns today for evaluation.  She underwent a panniculectomy approximately 6 weeks ago.  The procedure was complicated by several areas of minor wound separation.  These have been treated with Vashe soaked gauze.  She notes that she is still tired and has very little energy.  On evaluation all of the wounds are healing, clean, with good granulation bases.  There is no evidence of infection.  The surrounding tissue is healthy without erythema.  Status post panniculectomy: Patient has overall done well.  The minor separations are not unexpected with this procedure.  Everything is healing well without evidence of infection.  I suspect that the wounds will be closed within the next 2 weeks.  I do not have an explanation for her tiredness and lethargy.  I have strongly encouraged her to return to her primary care provider to explore this.  I do not believe that is related to the healing wounds.  She will follow-up with me in 2 to 3 weeks.

## 2024-08-14 ENCOUNTER — Ambulatory Visit: Payer: Self-pay | Admitting: Nurse Practitioner

## 2024-08-14 LAB — CBC WITH DIFFERENTIAL/PLATELET
Basophils Absolute: 0.1 x10E3/uL (ref 0.0–0.2)
Basos: 1 %
EOS (ABSOLUTE): 0.3 x10E3/uL (ref 0.0–0.4)
Eos: 4 %
Hematocrit: 40.7 % (ref 34.0–46.6)
Hemoglobin: 12.6 g/dL (ref 11.1–15.9)
Immature Grans (Abs): 0 x10E3/uL (ref 0.0–0.1)
Immature Granulocytes: 0 %
Lymphocytes Absolute: 2.9 x10E3/uL (ref 0.7–3.1)
Lymphs: 44 %
MCH: 26.8 pg (ref 26.6–33.0)
MCHC: 31 g/dL — ABNORMAL LOW (ref 31.5–35.7)
MCV: 86 fL (ref 79–97)
Monocytes Absolute: 0.7 x10E3/uL (ref 0.1–0.9)
Monocytes: 10 %
Neutrophils Absolute: 2.7 x10E3/uL (ref 1.4–7.0)
Neutrophils: 41 %
Platelets: 219 x10E3/uL (ref 150–450)
RBC: 4.71 x10E6/uL (ref 3.77–5.28)
RDW: 14.3 % (ref 11.7–15.4)
WBC: 6.7 x10E3/uL (ref 3.4–10.8)

## 2024-08-14 LAB — IRON,TIBC AND FERRITIN PANEL
Ferritin: 36 ng/mL (ref 15–150)
Iron Saturation: 26 % (ref 15–55)
Iron: 95 ug/dL (ref 27–159)
Total Iron Binding Capacity: 371 ug/dL (ref 250–450)
UIBC: 276 ug/dL (ref 131–425)

## 2024-08-14 LAB — VITAMIN B12: Vitamin B-12: >2000 pg/mL — ABNORMAL HIGH (ref 232–1245)

## 2024-08-14 LAB — TSH: TSH: 2.2 u[IU]/mL (ref 0.450–4.500)

## 2024-08-15 LAB — URINE CULTURE

## 2024-08-19 DIAGNOSIS — R7612 Nonspecific reaction to cell mediated immunity measurement of gamma interferon antigen response without active tuberculosis: Secondary | ICD-10-CM | POA: Insufficient documentation

## 2024-08-19 NOTE — Assessment & Plan Note (Signed)
 Persistent fatigue post-panniculectomy. Possible post-surgical recovery, thyroid dysfunction, vitamin B12 deficiency, or anemia. - Ordered labs for thyroid function, vitamin B12, and iron levels.

## 2024-08-19 NOTE — Assessment & Plan Note (Signed)
 She is encouraged to strive for BMI less than 30 to decrease cardiac risk. Advised to aim for at least 150 minutes of exercise per week.

## 2024-08-19 NOTE — Assessment & Plan Note (Signed)
 Encouraged to drink bone broth

## 2024-08-19 NOTE — Assessment & Plan Note (Signed)
 Previous positive TB test with subsequent negative results. Symptoms do not indicate active TB, latent TB remains a concern. - Referred to infectious disease specialist for latent TB evaluation. - Ordered chest x-ray to assess for active TB signs. Previous chest xray was negative

## 2024-08-19 NOTE — Assessment & Plan Note (Signed)
 Right hip and leg pain with palpable right thigh mass. Pain rated 7-8/10, worsens at night. Differential includes arthritis or muscle knot. - Ordered right hip x-ray for arthritis evaluation. - Ordered ultrasound of right thigh to assess mass. - Prescribed tramadol  for pain, caution advised due to gastric bypass.

## 2024-08-19 NOTE — Assessment & Plan Note (Signed)
 Tenderness on palpation, seems to be muscular in nature however will send a urine culture

## 2024-08-19 NOTE — Assessment & Plan Note (Signed)
 Firm mass noted to anterior thigh will check ultrasound especially due to having leg pain.

## 2024-08-21 ENCOUNTER — Ambulatory Visit
Admission: RE | Admit: 2024-08-21 | Discharge: 2024-08-21 | Disposition: A | Discharge status: Home / Self Care | Source: Ambulatory Visit | Attending: Nurse Practitioner | Admitting: Nurse Practitioner

## 2024-08-21 DIAGNOSIS — R2241 Localized swelling, mass and lump, right lower limb: Secondary | ICD-10-CM

## 2024-08-21 DIAGNOSIS — R10A1 Flank pain, right side: Secondary | ICD-10-CM

## 2024-08-21 DIAGNOSIS — R3129 Other microscopic hematuria: Secondary | ICD-10-CM

## 2024-08-21 NOTE — Progress Notes (Incomplete)
 " OUTPATIENT PHYSICAL THERAPY LOWER EXTREMITY EVALUATION   Patient Name: [name] MRN: [id number] DOB:[birth year], 49 y.o., female Today's Date: 08/21/2024  END OF SESSION:   Past Medical History:  Diagnosis Date   Anemia 12/26/2022   Anxiety    Arthritis    Blood transfusion without reported diagnosis 12/26/2022   Cancer (HCC)    ovarian cancer   Carpal tunnel syndrome of right wrist 11/21/2013   Coronary artery disease    Depression    Ganglion cyst of wrist 11/21/2013   right   Hematochezia 01/18/2023   Hypertension    Migraines    Pelvic mass 04/11/2021   Right tennis elbow 11/21/2013   Past Surgical History:  Procedure Laterality Date   ABDOMINAL HYSTERECTOMY     complete   CARDIAC CATHETERIZATION  06/27/2022   CARPAL TUNNEL RELEASE Right 12/12/2013   Procedure: ENDOSCOPY RIGHT WRIST CARPAL TUNNEL RELEASE EXCISION GANGLION WRIST PRIMARY ;  Surgeon: Evalene JONETTA Chancy, MD;  Location: [facility];  Service: Orthopedics;  Laterality: Right;   CARPAL TUNNEL RELEASE Left 05/31/2022   Procedure: LEFT CARPAL TUNNEL RELEASE;  Surgeon: Jerri Kay HERO, MD;  Location: [facility];  Service: Orthopedics;  Laterality: Left;   CESAREAN SECTION  03/11/2005; 04/05/2008   CHOLECYSTECTOMY  02/11/2004   lap. chole.   GASTRIC BYPASS     LATERAL EPICONDYLE RELEASE Right 12/12/2013   Procedure: RIGHT TENNIS ELBOW RELEASE WITH BONE REPAIR;  Surgeon: Evalene JONETTA Chancy, MD;  Location: [facility];  Service: Orthopedics;  Laterality: Right;   LUMBAR LAMINECTOMY WITH COFLEX 1 LEVEL Bilateral 01/14/2015   Procedure: Laminectomy - bilateral - L4-L5 ;  Surgeon: Darina Boehringer, MD;  Location: [facility];  Service: Neurosurgery;  Laterality: Bilateral;  Laminectomy - bilateral - L4-L5    LUMBAR WOUND DEBRIDEMENT N/A 02/02/2015   Procedure: LUMBAR WOUND DEBRIDEMENT, WASHOUT, CLOSURE;  Surgeon: Gerldine Maizes, MD;  Location: [facility];  Service:  Neurosurgery;  Laterality: N/A;  lumbar wound debridement   PANNICULECTOMY N/A 06/30/2024   Procedure: PANNICULECTOMY;  Surgeon: Waddell Leonce NOVAK, MD;  Location: [facility];  Service: Plastics;  Laterality: N/A;   TUBAL LIGATION  04/05/2008   Patient Active Problem List   Diagnosis Date Noted   Positive QuantiFERON-TB Gold test 08/19/2024   Hematuria 08/13/2024   Right hip pain 08/13/2024   Mass of right thigh 08/13/2024   Other fatigue 08/13/2024   Body aches 08/13/2024   Right leg pain 08/13/2024   Right flank pain 08/13/2024   Tingling sensation 03/23/2024   Multiple allergies 03/23/2024   Nonintractable episodic headache 01/25/2024   Cervical radiculopathy 01/25/2024   Generalized abdominal pain 01/25/2024   Pruritus 01/25/2024   Anemia 01/25/2024   Encounter for screening mammogram for breast cancer 11/18/2023   Elevated liver enzymes 11/18/2023   New daily persistent headache 11/18/2023   Class 2 obesity due to excess calories with body mass index (BMI) of 35.0 to 35.9 in adult 11/08/2023   Encounter for annual health examination 03/22/2023   Hypertensive heart disease without heart failure 03/22/2023   Mild recurrent major depression 03/22/2023   Class 2 obesity due to excess calories with body mass index (BMI) of 37.0 to 37.9 in adult 03/22/2023   Encounter for screening colonoscopy 03/22/2023   Vitamin D  deficiency 03/22/2023   Multiple acquired skin tags 03/22/2023   Aortic atherosclerosis 03/22/2023   S/P gastric bypass 03/22/2023   Morbid obesity with BMI of 40.0-44.9, adult (  HCC) 09/04/2022   Chronic neck pain 09/04/2022   Coronary artery disease involving native coronary artery of native heart without angina pectoris 06/27/2022   Abnormal cardiac CT angiography 06/08/2022   Carpal tunnel syndrome on left 11/28/2021   Numbness and tingling in left hand 10/11/2021   Abdominal cramping 09/26/2021   Hormone replacement therapy 09/26/2021   Teratoma,  malignant (HCC) 09/21/2020   Premature atrial contractions 06/10/2020   PVC (premature ventricular contraction) 06/10/2020   Acanthosis nigricans, acquired 06/14/2018   Prediabetes 06/14/2018   GAD (generalized anxiety disorder) 08/30/2017   Hypoestrogenism 08/30/2017   Migraine without aura 08/10/2015   GERD without esophagitis 08/10/2015   Obesity due to excess calories with serious comorbidity 08/10/2015   Primary insomnia 08/10/2015   Spinal stenosis, lumbar 01/14/2015   Carpal tunnel syndrome 12/12/2013   HYPERLIPIDEMIA 12/05/2007   Essential hypertension 12/05/2007   BRADYCARDIA 12/05/2007   Osteoarthritis 12/05/2007    PCP: Georgina Speaks, FN   REFERRING PROVIDER: Georgina Speaks, FN   REFERRING DIAG:  [phone] (ICD-10-CM) - Right leg pain  M25.551 (ICD-10-CM) - Right hip pain    THERAPY DIAG:  No diagnosis found.  Rationale for Evaluation and Treatment: Rehabilitation  ONSET DATE: ***  SUBJECTIVE:   SUBJECTIVE STATEMENT: ***  PERTINENT HISTORY: Anxiety, HTN, arthritis, spinal stenosis, mass R thigh  PAIN:  Are you having pain? Yes: NPRS scale: *** Pain location: *** Pain description: *** Aggravating factors: *** Relieving factors: ***  PRECAUTIONS: {Therapy precautions:24002}  RED FLAGS: None   WEIGHT BEARING RESTRICTIONS: {Yes ***/No:24003}  FALLS:  Has patient fallen in last 6 months? {fallsyesno:27318}  LIVING ENVIRONMENT: Lives with: {[facility] lives with:25569::lives with their family} Lives in: {Lives in:25570} Stairs: {opstairs:27293} Has following equipment at home: {Assistive devices:23999}  OCCUPATION: ***  PLOF: {PLOF:24004}  PATIENT GOALS: ***  NEXT MD VISIT: ***  OBJECTIVE:  Note: Objective measures were completed at Evaluation unless otherwise noted.  DIAGNOSTIC FINDINGS:   08/13/24 R hip  FINDINGS:   BONES AND JOINTS: No acute fracture. No malalignment.   LUMBAR SPINE: Mild degenerative disc disease in the visualized  lower lumbar spine.   SOFT TISSUES: Unremarkable.   IMPRESSION: 1. No acute findings.  PATIENT SURVEYS:  {rehab surveys:24030}  COGNITION: Overall cognitive status: {cognition:24006}     SENSATION: {sensation:27233}  EDEMA:  {edema:24020}  MUSCLE LENGTH: Hamstrings: Right *** deg; Left *** deg Debby test: Right *** deg; Left *** deg  POSTURE: {posture:25561}  PALPATION: ***  LOWER EXTREMITY ROM:  {AROM/PROM:27142} ROM Right eval Left eval  Hip flexion    Hip extension    Hip abduction    Hip adduction    Hip internal rotation    Hip external rotation    Knee flexion    Knee extension    Ankle dorsiflexion    Ankle plantarflexion    Ankle inversion    Ankle eversion     (Blank rows = not tested)  LOWER EXTREMITY MMT:  MMT Right eval Left eval  Hip flexion    Hip extension    Hip abduction    Hip adduction    Hip internal rotation    Hip external rotation    Knee flexion    Knee extension    Ankle dorsiflexion    Ankle plantarflexion    Ankle inversion    Ankle eversion     (Blank rows = not tested)  LOWER EXTREMITY SPECIAL TESTS:  {LEspecialtests:26242}  FUNCTIONAL TESTS:  {Functional tests:24029}  GAIT: Distance walked: *** Assistive device  utilized: {Assistive devices:23999} Level of assistance: {Levels of assistance:24026} Comments: ***                                                                                                                                TREATMENT DATE:  [facility] Adult PT Treatment:                                                DATE: 08/22/24 Therapeutic Exercise: Developed, instructed in, and pt completed therex as noted in HEP  Manual Therapy: *** Neuromuscular re-ed: *** Therapeutic Activity: *** Modalities: *** Self Care: Discussed eval finding and purpose of PT     PATIENT EDUCATION:  Education details: Eval findings, POC, HEP, self care  Person educated: Patient Education method:  Explanation, Demonstration, Tactile cues, Verbal cues, and Handouts Education comprehension: verbalized understanding, returned demonstration, verbal cues required, and tactile cues required  HOME EXERCISE PROGRAM: ***  ASSESSMENT:  CLINICAL IMPRESSION: Patient is a 49 y.o. female who was seen today for physical therapy evaluation and treatment for  M79.604 (ICD-10-CM) - Right leg pain  M25.551 (ICD-10-CM) - Right hip pain    OBJECTIVE IMPAIRMENTS: {opptimpairments:25111}.   ACTIVITY LIMITATIONS: {activitylimitations:27494}  PARTICIPATION LIMITATIONS: {participationrestrictions:25113}  PERSONAL FACTORS: {Personal factors:25162} are also affecting patient's functional outcome.   REHAB POTENTIAL: {rehabpotential:25112}  CLINICAL DECISION MAKING: {clinical decision making:25114}  EVALUATION COMPLEXITY: {Evaluation complexity:25115}   GOALS:  SHORT TERM GOALS: Target date: *** Pt will be Ind in an initial HEP Baseline: Goal status: INITIAL  2.  Pt will report 25% or greater improvement in pain for improved function and QOL Baseline:  Goal status: INITIAL  3.  *** Baseline:  Goal status: INITIAL  4.  *** Baseline:  Goal status: INITIAL  5.  *** Baseline:  Goal status: INITIAL  6.  *** Baseline:  Goal status: INITIAL  LONG TERM GOALS: Target date: ***  Pt will be Ind in a final HEP to maintain achieved LOF  Baseline: started  Goal status: INITIAL  2.  Pt will report 75% or greater improvement in pain for improved function and QO Baseline:  Goal status: INITIAL  3.  *** Baseline:  Goal status: INITIAL  4.  *** Baseline:  Goal status: INITIAL  5.  *** Baseline:  Goal status: INITIAL  6.  *** Baseline:  Goal status: INITIAL   PLAN:  PT FREQUENCY: {rehab frequency:25116}  PT DURATION: {rehab duration:25117}  PLANNED INTERVENTIONS: {rehab planned interventions:25118::97110-Therapeutic exercises,97530- Therapeutic [phone]-  Neuromuscular re-education,97535- Self Rjmz,02859- Manual therapy,Patient/Family education}  PLAN FOR NEXT SESSION: ; assess response to HEP; progress therex as indicated; use of modalities, manual therapy; and TPDN as indicated.    Kolden Dupee, PT 08/21/2024, 10:13 AM  "

## 2024-08-22 ENCOUNTER — Ambulatory Visit

## 2024-08-27 ENCOUNTER — Encounter: Admitting: Plastic Surgery

## 2024-08-28 ENCOUNTER — Ambulatory Visit: Payer: Self-pay | Admitting: Student

## 2024-08-28 ENCOUNTER — Encounter: Payer: Self-pay | Admitting: Student

## 2024-08-28 VITALS — BP 145/84 | HR 67 | Ht 59.0 in | Wt 176.6 lb

## 2024-08-28 DIAGNOSIS — Z9889 Other specified postprocedural states: Secondary | ICD-10-CM

## 2024-08-28 NOTE — Progress Notes (Cosign Needed)
 Patient is a 49 year old female who underwent panniculectomy with Dr. Waddell on 06/30/2024.  Patient is a little over 8 weeks postop.  She presents to the clinic today for postoperative follow-up.  Patient was last seen in the clinic on 08/13/2024.  At this visit, patient reported she was still tired and had very little energy.  On exam, the wounds were healing, clean and noted to have good granulation bases.  There is no sign of infection.  It was recommended she follow-up with her PCP in terms of her tiredness  Today, patient reports she is doing okay.  She states that she has been cleaning her wounds with Vashe and applying Xeroform over them.  She reports she is still having a little bit of drainage from the wounds.  She does report that she is still having pain near the wound on the left side.  She is unable to describe the pain, but reports the pain has been really bothering her..  She denies any fevers or chills.  She denies any nausea or vomiting.  She reports that she is having bowel movements.  She did state that she followed up with her PCP in regards to her tiredness and was started on vitamin D  and iron and recommended to continue to follow-up with them.  Chaperone present on exam.  On exam, patient sitting upright in no acute distress.  Abdomen is overall soft and nontender.  There is some mild tenderness to palpation to the left abdomen near the wound.  There is no peritoneal sign.  There is no overlying erythema to this area or the remainder of the abdomen, there is no obvious fluid collection on exam.  There are 2 small wounds noted to the incision.  To the left abdomen, the wound is approximately 6 x 1 x 0.15 cm.  He has healthy appearing granulation tissue throughout.  There is no surrounding erythema or drainage.  To the right side, wound is approximately 1 x 0.5 x 0.15 cm.  Wound appears to have healthy tissue throughout.  Remainder of the incision is intact and well-healed.  There are no  signs of infection on exam.  There is no active drainage from either wound.  Recommended that the patient continue to clean her wounds with Vashe.  Recommended she try calcium  alginate over the wounds daily.  Patient expressed understanding.  Discussed with the patient the source of her pain is not exactly clear.  We did discuss possible nerve pain/pain from the wound.  She states that she has been taking Tylenol , feels that this does not help.  She does report that she does have some gabapentin .  She states she cannot take ibuprofen.  I recommended that she take the gabapentin  as needed if Tylenol  is not covering the pain.  Also recommended referral to pain management given ongoing pain.  Discussed with her she can also reach out to her PCP and see if they would manage her pain or place her on gabapentin .  I discussed with her that if they do not, she may follow-up with pain management.  Patient expressed understanding was agreeable to this.  Pictures were obtained of the patient and placed in the chart with the patient's or guardian's permission.   Patient to follow back up in a few weeks.  I instructed her to call in the meantime she has any questions or concerns about anything.  Reviewed today's encounter with Dr. Waddell and he was in agreement with the plan.

## 2024-09-12 ENCOUNTER — Ambulatory Visit: Payer: Self-pay | Admitting: Internal Medicine

## 2024-09-12 ENCOUNTER — Encounter: Payer: Self-pay | Admitting: Student

## 2024-09-15 ENCOUNTER — Ambulatory Visit: Payer: Self-pay | Admitting: Nurse Practitioner

## 2024-12-08 ENCOUNTER — Ambulatory Visit: Admitting: Dermatology

## 2025-03-23 ENCOUNTER — Encounter: Payer: Self-pay | Admitting: Nurse Practitioner
